# Patient Record
Sex: Male | Born: 1935 | Race: White | Hispanic: No | Smoking: Current every day smoker
Health system: Southern US, Community
[De-identification: ages and names within clinical notes are randomized; demographics above are authoritative.]

## PROBLEM LIST (undated history)

## (undated) ENCOUNTER — Emergency Department (HOSPITAL_COMMUNITY): Discharge status: Absent without leave | Payer: Medicare Other

## (undated) DIAGNOSIS — N186 End stage renal disease: Secondary | ICD-10-CM

## (undated) DIAGNOSIS — I10 Essential (primary) hypertension: Secondary | ICD-10-CM

## (undated) DIAGNOSIS — E119 Type 2 diabetes mellitus without complications: Secondary | ICD-10-CM

## (undated) DIAGNOSIS — E039 Hypothyroidism, unspecified: Secondary | ICD-10-CM

## (undated) DIAGNOSIS — M899 Disorder of bone, unspecified: Secondary | ICD-10-CM

## (undated) DIAGNOSIS — Z992 Dependence on renal dialysis: Secondary | ICD-10-CM

## (undated) DIAGNOSIS — N189 Chronic kidney disease, unspecified: Secondary | ICD-10-CM

## (undated) DIAGNOSIS — I739 Peripheral vascular disease, unspecified: Secondary | ICD-10-CM

## (undated) DIAGNOSIS — R809 Proteinuria, unspecified: Secondary | ICD-10-CM

## (undated) DIAGNOSIS — D649 Anemia, unspecified: Secondary | ICD-10-CM

## (undated) DIAGNOSIS — R519 Headache, unspecified: Secondary | ICD-10-CM

## (undated) DIAGNOSIS — I509 Heart failure, unspecified: Secondary | ICD-10-CM

## (undated) DIAGNOSIS — E785 Hyperlipidemia, unspecified: Secondary | ICD-10-CM

## (undated) DIAGNOSIS — H9190 Unspecified hearing loss, unspecified ear: Secondary | ICD-10-CM

## (undated) DIAGNOSIS — M1990 Unspecified osteoarthritis, unspecified site: Secondary | ICD-10-CM

## (undated) DIAGNOSIS — I4891 Unspecified atrial fibrillation: Secondary | ICD-10-CM

## (undated) DIAGNOSIS — E1122 Type 2 diabetes mellitus with diabetic chronic kidney disease: Secondary | ICD-10-CM

## (undated) DIAGNOSIS — G4730 Sleep apnea, unspecified: Secondary | ICD-10-CM

## (undated) DIAGNOSIS — Z973 Presence of spectacles and contact lenses: Secondary | ICD-10-CM

## (undated) HISTORY — PX: CATARACT EXTRACTION W/ INTRAOCULAR LENS  IMPLANT, BILATERAL: SHX1307

## (undated) HISTORY — PX: COLONOSCOPY: SHX174

## (undated) HISTORY — PX: HIP ARTHROSCOPY: SHX668

---

## 2014-05-09 ENCOUNTER — Encounter: Payer: Self-pay | Admitting: Internal Medicine

## 2018-01-06 ENCOUNTER — Other Ambulatory Visit (HOSPITAL_COMMUNITY): Payer: Self-pay | Admitting: Nephrology

## 2018-01-06 DIAGNOSIS — N183 Chronic kidney disease, stage 3 unspecified: Secondary | ICD-10-CM

## 2018-01-13 ENCOUNTER — Ambulatory Visit (HOSPITAL_COMMUNITY)
Admission: RE | Admit: 2018-01-13 | Discharge: 2018-01-13 | Disposition: A | Discharge status: Home / Self Care | Payer: Medicare Other | Source: Ambulatory Visit | Attending: Nephrology | Admitting: Nephrology

## 2018-01-13 DIAGNOSIS — N183 Chronic kidney disease, stage 3 unspecified: Secondary | ICD-10-CM

## 2018-09-18 ENCOUNTER — Ambulatory Visit (HOSPITAL_COMMUNITY): Payer: Medicare Other

## 2018-09-18 ENCOUNTER — Inpatient Hospital Stay (HOSPITAL_COMMUNITY): Admission: RE | Admit: 2018-09-18 | Payer: Medicare Other | Source: Ambulatory Visit

## 2018-09-20 ENCOUNTER — Encounter (HOSPITAL_COMMUNITY): Admission: RE | Admit: 2018-09-20 | Payer: Medicare Other | Source: Ambulatory Visit

## 2018-09-20 ENCOUNTER — Encounter (HOSPITAL_COMMUNITY): Payer: Medicare Other

## 2018-09-22 ENCOUNTER — Encounter (HOSPITAL_COMMUNITY): Payer: Medicare Other

## 2018-09-22 ENCOUNTER — Encounter (HOSPITAL_COMMUNITY): Admission: RE | Admit: 2018-09-22 | Payer: Medicare Other | Source: Ambulatory Visit

## 2018-09-27 ENCOUNTER — Encounter (HOSPITAL_COMMUNITY)
Admission: RE | Admit: 2018-09-27 | Discharge: 2018-09-27 | Disposition: A | Discharge status: Home / Self Care | Payer: Medicare Other | Source: Ambulatory Visit | Attending: Nephrology | Admitting: Nephrology

## 2018-09-27 ENCOUNTER — Encounter (HOSPITAL_COMMUNITY): Admission: RE | Admit: 2018-09-27 | Payer: Medicare Other | Source: Ambulatory Visit

## 2018-09-27 DIAGNOSIS — D631 Anemia in chronic kidney disease: Secondary | ICD-10-CM | POA: Diagnosis not present

## 2018-09-27 DIAGNOSIS — N185 Chronic kidney disease, stage 5: Secondary | ICD-10-CM | POA: Diagnosis present

## 2018-09-27 LAB — POCT HEMOGLOBIN-HEMACUE: Hemoglobin: 7.8 g/dL — ABNORMAL LOW (ref 13.0–17.0)

## 2018-09-27 MED ORDER — EPOETIN ALFA 10000 UNIT/ML IJ SOLN
INTRAMUSCULAR | Status: AC
  Filled 2018-09-27: qty 1

## 2018-09-27 MED ORDER — EPOETIN ALFA 10000 UNIT/ML IJ SOLN
10000.0000 [IU] | Freq: Once | INTRAMUSCULAR | Status: AC
  Administered 2018-09-27: 10000 [IU] via SUBCUTANEOUS

## 2018-09-27 NOTE — Progress Notes (Signed)
Results for LINKON, SIVERSON (MRN [id number]) as of 09/27/2018 09:08  Ref. Range 09/27/2018 08:52  Hemoglobin Latest Ref Range: 13.0 - 17.0 g/dL 7.8 (L)

## 2018-10-09 ENCOUNTER — Encounter (HOSPITAL_COMMUNITY)
Admission: RE | Admit: 2018-10-09 | Discharge: 2018-10-09 | Disposition: A | Discharge status: Home / Self Care | Payer: Medicare Other | Source: Ambulatory Visit | Attending: Nephrology | Admitting: Nephrology

## 2018-10-09 ENCOUNTER — Other Ambulatory Visit (HOSPITAL_COMMUNITY): Payer: Medicare Other

## 2018-10-09 ENCOUNTER — Ambulatory Visit (HOSPITAL_COMMUNITY): Payer: Medicare Other

## 2018-10-09 DIAGNOSIS — N185 Chronic kidney disease, stage 5: Secondary | ICD-10-CM | POA: Diagnosis not present

## 2018-10-09 LAB — POCT HEMOGLOBIN-HEMACUE: Hemoglobin: 7.8 g/dL — ABNORMAL LOW (ref 13.0–17.0)

## 2018-10-09 MED ORDER — EPOETIN ALFA 10000 UNIT/ML IJ SOLN
10000.0000 [IU] | INTRAMUSCULAR | Status: DC
  Administered 2018-10-09: 10000 [IU] via SUBCUTANEOUS

## 2018-10-09 MED ORDER — EPOETIN ALFA 10000 UNIT/ML IJ SOLN
INTRAMUSCULAR | Status: AC
  Filled 2018-10-09: qty 1

## 2018-10-09 NOTE — Progress Notes (Signed)
Results for Terry Graves, EMPEY (MRN [id number]) as of 10/09/2018 07:58  Ref. Range 10/09/2018 07:46  Hemoglobin Latest Ref Range: 13.0 - 17.0 g/dL 7.8 (L)

## 2018-10-23 ENCOUNTER — Encounter (HOSPITAL_COMMUNITY): Payer: Medicare Other

## 2018-10-25 ENCOUNTER — Encounter (HOSPITAL_COMMUNITY)
Admission: RE | Admit: 2018-10-25 | Discharge: 2018-10-25 | Disposition: A | Discharge status: Home / Self Care | Payer: Medicare Other | Source: Ambulatory Visit | Attending: Nephrology | Admitting: Nephrology

## 2018-10-25 DIAGNOSIS — N185 Chronic kidney disease, stage 5: Secondary | ICD-10-CM | POA: Diagnosis not present

## 2018-10-25 DIAGNOSIS — D631 Anemia in chronic kidney disease: Secondary | ICD-10-CM | POA: Insufficient documentation

## 2018-10-25 LAB — POCT HEMOGLOBIN-HEMACUE: Hemoglobin: 8.2 g/dL — ABNORMAL LOW (ref 13.0–17.0)

## 2018-10-25 MED ORDER — EPOETIN ALFA 10000 UNIT/ML IJ SOLN
INTRAMUSCULAR | Status: AC
  Filled 2018-10-25: qty 1

## 2018-10-25 MED ORDER — EPOETIN ALFA 10000 UNIT/ML IJ SOLN
10000.0000 [IU] | Freq: Once | INTRAMUSCULAR | Status: AC
  Administered 2018-10-25: 10000 [IU] via SUBCUTANEOUS

## 2018-10-25 NOTE — Progress Notes (Signed)
Results for KIPLING, GRASER (MRN [id number]) as of 10/25/2018 08:53  Ref. Range 10/25/2018 07:40  Hemoglobin Latest Ref Range: 13.0 - 17.0 g/dL 8.2 (L)

## 2018-11-08 ENCOUNTER — Encounter (HOSPITAL_COMMUNITY)
Admission: RE | Admit: 2018-11-08 | Discharge: 2018-11-08 | Disposition: A | Discharge status: Home / Self Care | Payer: Medicare Other | Source: Ambulatory Visit | Attending: Nephrology | Admitting: Nephrology

## 2018-11-08 ENCOUNTER — Encounter (HOSPITAL_COMMUNITY): Payer: Self-pay

## 2018-11-08 DIAGNOSIS — N185 Chronic kidney disease, stage 5: Secondary | ICD-10-CM | POA: Diagnosis not present

## 2018-11-08 HISTORY — DX: Disorder of bone, unspecified: M89.9

## 2018-11-08 HISTORY — DX: Type 2 diabetes mellitus without complications: E11.9

## 2018-11-08 HISTORY — DX: Disorder of bone, unspecified: N18.9

## 2018-11-08 HISTORY — DX: Proteinuria, unspecified: R80.9

## 2018-11-08 HISTORY — DX: Essential (primary) hypertension: I10

## 2018-11-08 HISTORY — DX: Disorder of mineral metabolism, unspecified: E83.9

## 2018-11-08 HISTORY — DX: Anemia, unspecified: D64.9

## 2018-11-08 LAB — POCT HEMOGLOBIN-HEMACUE: Hemoglobin: 8 g/dL — ABNORMAL LOW (ref 13.0–17.0)

## 2018-11-08 MED ORDER — EPOETIN ALFA 10000 UNIT/ML IJ SOLN
10000.0000 [IU] | INTRAMUSCULAR | Status: DC
  Administered 2018-11-08: 10000 [IU] via SUBCUTANEOUS
  Filled 2018-11-08: qty 1

## 2018-11-22 ENCOUNTER — Encounter (HOSPITAL_COMMUNITY)
Admission: RE | Admit: 2018-11-22 | Discharge: 2018-11-22 | Disposition: A | Discharge status: Home / Self Care | Payer: Medicare Other | Source: Ambulatory Visit | Attending: Nephrology | Admitting: Nephrology

## 2018-11-22 ENCOUNTER — Encounter (HOSPITAL_COMMUNITY): Payer: Self-pay

## 2018-11-22 DIAGNOSIS — N185 Chronic kidney disease, stage 5: Secondary | ICD-10-CM | POA: Diagnosis present

## 2018-11-22 DIAGNOSIS — D631 Anemia in chronic kidney disease: Secondary | ICD-10-CM | POA: Insufficient documentation

## 2018-11-22 LAB — POCT HEMOGLOBIN-HEMACUE: Hemoglobin: 8.1 g/dL — ABNORMAL LOW (ref 13.0–17.0)

## 2018-11-22 MED ORDER — EPOETIN ALFA 10000 UNIT/ML IJ SOLN
10000.0000 [IU] | Freq: Once | INTRAMUSCULAR | Status: AC
  Administered 2018-11-22: 10000 [IU] via SUBCUTANEOUS
  Filled 2018-11-22: qty 1

## 2018-12-06 ENCOUNTER — Encounter (HOSPITAL_COMMUNITY): Admission: RE | Admit: 2018-12-06 | Payer: Medicare Other | Source: Ambulatory Visit

## 2018-12-06 ENCOUNTER — Encounter (HOSPITAL_COMMUNITY)
Admission: RE | Admit: 2018-12-06 | Discharge: 2018-12-06 | Disposition: A | Discharge status: Home / Self Care | Payer: Medicare Other | Source: Ambulatory Visit | Attending: Nephrology | Admitting: Nephrology

## 2018-12-06 ENCOUNTER — Encounter (HOSPITAL_COMMUNITY): Payer: Self-pay

## 2018-12-06 ENCOUNTER — Encounter (HOSPITAL_COMMUNITY): Payer: Medicare Other

## 2018-12-06 DIAGNOSIS — N185 Chronic kidney disease, stage 5: Secondary | ICD-10-CM | POA: Diagnosis not present

## 2018-12-06 LAB — POCT HEMOGLOBIN-HEMACUE: Hemoglobin: 9 g/dL — ABNORMAL LOW (ref 13.0–17.0)

## 2018-12-06 MED ORDER — EPOETIN ALFA 10000 UNIT/ML IJ SOLN
10000.0000 [IU] | Freq: Once | INTRAMUSCULAR | Status: AC
  Administered 2018-12-06: 10000 [IU] via SUBCUTANEOUS
  Filled 2018-12-06: qty 1

## 2018-12-20 ENCOUNTER — Encounter (HOSPITAL_COMMUNITY)
Admission: RE | Admit: 2018-12-20 | Discharge: 2018-12-20 | Disposition: A | Discharge status: Home / Self Care | Payer: Medicare Other | Source: Ambulatory Visit | Attending: Nephrology | Admitting: Nephrology

## 2018-12-20 ENCOUNTER — Encounter (HOSPITAL_COMMUNITY): Payer: Self-pay

## 2018-12-20 ENCOUNTER — Other Ambulatory Visit: Payer: Self-pay

## 2018-12-20 DIAGNOSIS — N185 Chronic kidney disease, stage 5: Secondary | ICD-10-CM | POA: Insufficient documentation

## 2018-12-20 DIAGNOSIS — D631 Anemia in chronic kidney disease: Secondary | ICD-10-CM | POA: Diagnosis not present

## 2018-12-20 LAB — POCT HEMOGLOBIN-HEMACUE: Hemoglobin: 8.3 g/dL — ABNORMAL LOW (ref 13.0–17.0)

## 2018-12-20 MED ORDER — EPOETIN ALFA 10000 UNIT/ML IJ SOLN
10000.0000 [IU] | Freq: Once | INTRAMUSCULAR | Status: AC
  Administered 2018-12-20: 10000 [IU] via SUBCUTANEOUS

## 2018-12-20 MED ORDER — EPOETIN ALFA 10000 UNIT/ML IJ SOLN
INTRAMUSCULAR | Status: AC
  Filled 2018-12-20: qty 1

## 2019-01-03 ENCOUNTER — Ambulatory Visit (HOSPITAL_COMMUNITY): Payer: Medicare Other

## 2019-01-03 ENCOUNTER — Inpatient Hospital Stay (HOSPITAL_COMMUNITY): Admission: RE | Admit: 2019-01-03 | Payer: Medicare Other | Source: Ambulatory Visit

## 2019-01-23 ENCOUNTER — Encounter (HOSPITAL_COMMUNITY)
Admission: RE | Admit: 2019-01-23 | Discharge: 2019-01-23 | Disposition: A | Discharge status: Home / Self Care | Payer: Medicare Other | Source: Ambulatory Visit | Attending: Nephrology | Admitting: Nephrology

## 2019-01-23 ENCOUNTER — Other Ambulatory Visit: Payer: Self-pay

## 2019-01-23 DIAGNOSIS — N185 Chronic kidney disease, stage 5: Secondary | ICD-10-CM | POA: Insufficient documentation

## 2019-01-23 DIAGNOSIS — D631 Anemia in chronic kidney disease: Secondary | ICD-10-CM | POA: Insufficient documentation

## 2019-01-23 LAB — FERRITIN: Ferritin: 371 ng/mL — ABNORMAL HIGH (ref 24–336)

## 2019-01-23 LAB — RENAL FUNCTION PANEL
Albumin: 3.4 g/dL — ABNORMAL LOW (ref 3.5–5.0)
Anion gap: 8 (ref 5–15)
BUN: 76 mg/dL — ABNORMAL HIGH (ref 8–23)
CO2: 19 mmol/L — ABNORMAL LOW (ref 22–32)
Calcium: 8.8 mg/dL — ABNORMAL LOW (ref 8.9–10.3)
Chloride: 113 mmol/L — ABNORMAL HIGH (ref 98–111)
Creatinine, Ser: 3.86 mg/dL — ABNORMAL HIGH (ref 0.61–1.24)
GFR calc Af Amer: 16 mL/min — ABNORMAL LOW (ref >60)
GFR calc non Af Amer: 14 mL/min — ABNORMAL LOW (ref >60)
Glucose, Bld: 102 mg/dL — ABNORMAL HIGH (ref 70–99)
Phosphorus: 4 mg/dL (ref 2.5–4.6)
Potassium: 4.5 mmol/L (ref 3.5–5.1)
Sodium: 140 mmol/L (ref 135–145)

## 2019-01-23 LAB — PROTEIN / CREATININE RATIO, URINE
Creatinine, Urine: 36.91 mg/dL
Protein Creatinine Ratio: 1.9 mg/mg{Cre} — ABNORMAL HIGH (ref 0.00–0.15)
Total Protein, Urine: 70 mg/dL

## 2019-01-23 LAB — IRON AND TIBC
Iron: 143 ug/dL (ref 45–182)
Saturation Ratios: 41 % — ABNORMAL HIGH (ref 17.9–39.5)
TIBC: 348 ug/dL (ref 250–450)
UIBC: 205 ug/dL

## 2019-01-23 LAB — HEMOGLOBIN AND HEMATOCRIT, BLOOD
HCT: 25 % — ABNORMAL LOW (ref 39.0–52.0)
Hemoglobin: 7.9 g/dL — ABNORMAL LOW (ref 13.0–17.0)

## 2019-01-23 MED ORDER — EPOETIN ALFA 10000 UNIT/ML IJ SOLN
10000.0000 [IU] | INTRAMUSCULAR | Status: DC
  Administered 2019-01-23: 10000 [IU] via SUBCUTANEOUS

## 2019-01-23 MED ORDER — EPOETIN ALFA 10000 UNIT/ML IJ SOLN
INTRAMUSCULAR | Status: AC
  Filled 2019-01-23: qty 1

## 2019-01-23 NOTE — Progress Notes (Signed)
  Patient arrived today for lab work HGB 7.9 HCT 25.0 Procrit 10,000 units given as ordered. These are the lab that are available, some are still pending.   Results for Terry Graves, Terry Graves (MRN [id number]) as of 01/23/2019 15:14  Ref. Range 01/23/2019 12:17  Sodium Latest Ref Range: 135 - 145 mmol/L 140  Potassium Latest Ref Range: 3.5 - 5.1 mmol/L 4.5  Chloride Latest Ref Range: 98 - 111 mmol/L 113 (H)  CO2 Latest Ref Range: 22 - 32 mmol/L 19 (L)  Glucose Latest Ref Range: 70 - 99 mg/dL 102 (H)  BUN Latest Ref Range: 8 - 23 mg/dL 76 (H)  Creatinine Latest Ref Range: 0.61 - 1.24 mg/dL 3.86 (H)  Calcium Latest Ref Range: 8.9 - 10.3 mg/dL 8.8 (L)  Anion gap Latest Ref Range: 5 - 15  8  Phosphorus Latest Ref Range: 2.5 - 4.6 mg/dL 4.0  Albumin Latest Ref Range: 3.5 - 5.0 g/dL 3.4 (L)  GFR, Est Non African American Latest Ref Range: >60 mL/min 14 (L)  GFR, Est African American Latest Ref Range: >60 mL/min 16 (L)  Iron Latest Ref Range: 45 - 182 ug/dL 143  UIBC Latest Units: ug/dL 205  TIBC Latest Ref Range: 250 - 450 ug/dL 348  Saturation Ratios Latest Ref Range: 17.9 - 39.5 % 41 (H)  Ferritin Latest Ref Range: 24 - 336 ng/mL 371 (H)  Hemoglobin Latest Ref Range: 13.0 - 17.0 g/dL 7.9 (L)  HCT Latest Ref Range: 39.0 - 52.0 % 25.0 (L)  Total Protein, Urine Latest Units: mg/dL 70  Protein Creatinine Ratio Latest Ref Range: 0.00 - 0.15 mg/mgCre 1.90 (H)  Creatinine, Urine Latest Units: mg/dL 36.91

## 2019-01-24 LAB — PTH, INTACT AND CALCIUM
Calcium, Total (PTH): 8.9 mg/dL (ref 8.6–10.2)
PTH: 47 pg/mL (ref 15–65)

## 2019-01-24 LAB — VITAMIN D 25 HYDROXY (VIT D DEFICIENCY, FRACTURES): Vit D, 25-Hydroxy: 64.1 ng/mL (ref 30.0–100.0)

## 2019-02-06 ENCOUNTER — Encounter (HOSPITAL_COMMUNITY): Payer: Medicare Other

## 2019-02-06 ENCOUNTER — Encounter (HOSPITAL_COMMUNITY): Admission: RE | Admit: 2019-02-06 | Payer: Medicare Other | Source: Ambulatory Visit

## 2019-02-07 ENCOUNTER — Encounter (HOSPITAL_COMMUNITY): Payer: Self-pay

## 2019-02-07 ENCOUNTER — Encounter (HOSPITAL_COMMUNITY)
Admission: RE | Admit: 2019-02-07 | Discharge: 2019-02-07 | Disposition: A | Discharge status: Home / Self Care | Payer: Medicare Other | Source: Ambulatory Visit | Attending: Nephrology | Admitting: Nephrology

## 2019-02-07 ENCOUNTER — Other Ambulatory Visit: Payer: Self-pay

## 2019-02-07 DIAGNOSIS — N185 Chronic kidney disease, stage 5: Secondary | ICD-10-CM | POA: Diagnosis not present

## 2019-02-07 LAB — POCT HEMOGLOBIN-HEMACUE
Hemoglobin: 15.2 g/dL (ref 13.0–17.0)
Hemoglobin: 7.6 g/dL — ABNORMAL LOW (ref 13.0–17.0)

## 2019-02-07 MED ORDER — EPOETIN ALFA 10000 UNIT/ML IJ SOLN
10000.0000 [IU] | INTRAMUSCULAR | Status: DC
  Administered 2019-02-07: 10000 [IU] via SUBCUTANEOUS

## 2019-02-07 MED ORDER — EPOETIN ALFA 10000 UNIT/ML IJ SOLN
INTRAMUSCULAR | Status: AC
  Filled 2019-02-07: qty 1

## 2019-02-07 NOTE — Progress Notes (Signed)
Results for MANUAL, NAVARRA (MRN [id number]) as of 02/07/2019 11:4)  )Procrit 10,000 units given as indicated.  Next appointment 02/21/2019 @ 0800  Ref. Range 02/07/2019 11:13  Hemoglobin Latest Ref Range: 13.0 - 17.0 g/dL 7.6 (L)

## 2019-02-21 ENCOUNTER — Encounter (HOSPITAL_COMMUNITY)
Admission: RE | Admit: 2019-02-21 | Discharge: 2019-02-21 | Disposition: A | Discharge status: Home / Self Care | Payer: Medicare Other | Source: Ambulatory Visit | Attending: Nephrology | Admitting: Nephrology

## 2019-02-21 ENCOUNTER — Other Ambulatory Visit: Payer: Self-pay

## 2019-02-21 DIAGNOSIS — D631 Anemia in chronic kidney disease: Secondary | ICD-10-CM | POA: Insufficient documentation

## 2019-02-21 DIAGNOSIS — N185 Chronic kidney disease, stage 5: Secondary | ICD-10-CM | POA: Insufficient documentation

## 2019-02-21 LAB — POCT HEMOGLOBIN-HEMACUE: Hemoglobin: 7.8 g/dL — ABNORMAL LOW (ref 13.0–17.0)

## 2019-02-21 MED ORDER — DARBEPOETIN ALFA 60 MCG/0.3ML IJ SOSY
PREFILLED_SYRINGE | INTRAMUSCULAR | Status: AC
  Filled 2019-02-21: qty 0.3

## 2019-02-21 MED ORDER — DARBEPOETIN ALFA 60 MCG/0.3ML IJ SOSY
60.0000 ug | PREFILLED_SYRINGE | INTRAMUSCULAR | Status: DC
  Administered 2019-02-21: 60 ug via SUBCUTANEOUS

## 2019-03-07 ENCOUNTER — Other Ambulatory Visit: Payer: Self-pay

## 2019-03-07 ENCOUNTER — Encounter (HOSPITAL_COMMUNITY)
Admission: RE | Admit: 2019-03-07 | Discharge: 2019-03-07 | Disposition: A | Discharge status: Home / Self Care | Payer: Medicare Other | Source: Ambulatory Visit | Attending: Nephrology | Admitting: Nephrology

## 2019-03-07 DIAGNOSIS — N185 Chronic kidney disease, stage 5: Secondary | ICD-10-CM | POA: Diagnosis not present

## 2019-03-07 LAB — POCT HEMOGLOBIN-HEMACUE: Hemoglobin: 7.4 g/dL — ABNORMAL LOW (ref 13.0–17.0)

## 2019-03-07 MED ORDER — DARBEPOETIN ALFA 60 MCG/0.3ML IJ SOSY
60.0000 ug | PREFILLED_SYRINGE | INTRAMUSCULAR | Status: DC
  Administered 2019-03-07: 60 ug via SUBCUTANEOUS
  Filled 2019-03-07: qty 0.3

## 2019-03-07 MED ORDER — SIMETHICONE 40 MG/0.6ML PO SUSP
ORAL | Status: AC
  Filled 2019-03-07: qty 0.6

## 2019-03-21 ENCOUNTER — Other Ambulatory Visit: Payer: Self-pay

## 2019-03-21 ENCOUNTER — Encounter (HOSPITAL_COMMUNITY)
Admission: RE | Admit: 2019-03-21 | Discharge: 2019-03-21 | Disposition: A | Discharge status: Home / Self Care | Payer: Medicare Other | Source: Ambulatory Visit | Attending: Nephrology | Admitting: Nephrology

## 2019-03-21 ENCOUNTER — Encounter (HOSPITAL_COMMUNITY): Payer: Self-pay

## 2019-03-21 DIAGNOSIS — N185 Chronic kidney disease, stage 5: Secondary | ICD-10-CM | POA: Insufficient documentation

## 2019-03-21 DIAGNOSIS — D631 Anemia in chronic kidney disease: Secondary | ICD-10-CM | POA: Insufficient documentation

## 2019-03-21 LAB — POCT HEMOGLOBIN-HEMACUE: Hemoglobin: 7.8 g/dL — ABNORMAL LOW (ref 13.0–17.0)

## 2019-03-21 MED ORDER — DARBEPOETIN ALFA 60 MCG/0.3ML IJ SOSY
60.0000 ug | PREFILLED_SYRINGE | Freq: Once | INTRAMUSCULAR | Status: AC
  Administered 2019-03-21: 60 ug via SUBCUTANEOUS
  Filled 2019-03-21: qty 0.3

## 2019-03-21 NOTE — Progress Notes (Signed)
Results for Terry Graves, Terry Graves (MRN [id number]) as of 03/21/2019 09:25  Ref. Range 03/21/2019 08:18  Hemoglobin Latest Ref Range: 13.0 - 17.0 g/dL 7.8 (L)

## 2019-04-04 ENCOUNTER — Encounter (HOSPITAL_COMMUNITY)
Admission: RE | Admit: 2019-04-04 | Discharge: 2019-04-04 | Disposition: A | Discharge status: Home / Self Care | Payer: Medicare Other | Source: Ambulatory Visit | Attending: Nephrology | Admitting: Nephrology

## 2019-04-04 ENCOUNTER — Encounter (HOSPITAL_COMMUNITY): Payer: Medicare Other

## 2019-04-11 ENCOUNTER — Ambulatory Visit (HOSPITAL_COMMUNITY): Admission: RE | Admit: 2019-04-11 | Payer: Medicare Other | Source: Ambulatory Visit

## 2019-04-11 ENCOUNTER — Ambulatory Visit (HOSPITAL_COMMUNITY): Payer: Medicare Other

## 2019-04-24 ENCOUNTER — Emergency Department (HOSPITAL_COMMUNITY): Payer: Medicare Other

## 2019-04-24 ENCOUNTER — Inpatient Hospital Stay (HOSPITAL_COMMUNITY)
Admission: EM | Admit: 2019-04-24 | Discharge: 2019-04-27 | DRG: 378 | Disposition: A | Discharge status: Home Health | Payer: Medicare Other | Source: Ambulatory Visit | Attending: Internal Medicine | Admitting: Internal Medicine

## 2019-04-24 ENCOUNTER — Encounter (HOSPITAL_COMMUNITY)
Admission: RE | Admit: 2019-04-24 | Discharge: 2019-04-24 | Disposition: A | Discharge status: Home / Self Care | Payer: Medicare Other | Source: Ambulatory Visit | Attending: Nephrology | Admitting: Nephrology

## 2019-04-24 ENCOUNTER — Encounter (HOSPITAL_COMMUNITY): Payer: Self-pay

## 2019-04-24 ENCOUNTER — Other Ambulatory Visit: Payer: Self-pay

## 2019-04-24 DIAGNOSIS — K254 Chronic or unspecified gastric ulcer with hemorrhage: Principal | ICD-10-CM | POA: Diagnosis present

## 2019-04-24 DIAGNOSIS — R195 Other fecal abnormalities: Secondary | ICD-10-CM

## 2019-04-24 DIAGNOSIS — E785 Hyperlipidemia, unspecified: Secondary | ICD-10-CM | POA: Diagnosis present

## 2019-04-24 DIAGNOSIS — I471 Supraventricular tachycardia: Secondary | ICD-10-CM | POA: Diagnosis present

## 2019-04-24 DIAGNOSIS — M25552 Pain in left hip: Secondary | ICD-10-CM | POA: Diagnosis present

## 2019-04-24 DIAGNOSIS — K2970 Gastritis, unspecified, without bleeding: Secondary | ICD-10-CM | POA: Diagnosis not present

## 2019-04-24 DIAGNOSIS — E782 Mixed hyperlipidemia: Secondary | ICD-10-CM | POA: Diagnosis not present

## 2019-04-24 DIAGNOSIS — R296 Repeated falls: Secondary | ICD-10-CM | POA: Diagnosis present

## 2019-04-24 DIAGNOSIS — Z801 Family history of malignant neoplasm of trachea, bronchus and lung: Secondary | ICD-10-CM

## 2019-04-24 DIAGNOSIS — I120 Hypertensive chronic kidney disease with stage 5 chronic kidney disease or end stage renal disease: Secondary | ICD-10-CM | POA: Diagnosis present

## 2019-04-24 DIAGNOSIS — Z7982 Long term (current) use of aspirin: Secondary | ICD-10-CM

## 2019-04-24 DIAGNOSIS — I340 Nonrheumatic mitral (valve) insufficiency: Secondary | ICD-10-CM | POA: Diagnosis not present

## 2019-04-24 DIAGNOSIS — D62 Acute posthemorrhagic anemia: Secondary | ICD-10-CM | POA: Diagnosis present

## 2019-04-24 DIAGNOSIS — I361 Nonrheumatic tricuspid (valve) insufficiency: Secondary | ICD-10-CM | POA: Diagnosis not present

## 2019-04-24 DIAGNOSIS — D649 Anemia, unspecified: Secondary | ICD-10-CM

## 2019-04-24 DIAGNOSIS — L89152 Pressure ulcer of sacral region, stage 2: Secondary | ICD-10-CM | POA: Diagnosis present

## 2019-04-24 DIAGNOSIS — F17290 Nicotine dependence, other tobacco product, uncomplicated: Secondary | ICD-10-CM | POA: Diagnosis present

## 2019-04-24 DIAGNOSIS — G4730 Sleep apnea, unspecified: Secondary | ICD-10-CM | POA: Diagnosis present

## 2019-04-24 DIAGNOSIS — E1122 Type 2 diabetes mellitus with diabetic chronic kidney disease: Secondary | ICD-10-CM | POA: Diagnosis present

## 2019-04-24 DIAGNOSIS — R2689 Other abnormalities of gait and mobility: Secondary | ICD-10-CM | POA: Diagnosis present

## 2019-04-24 DIAGNOSIS — K259 Gastric ulcer, unspecified as acute or chronic, without hemorrhage or perforation: Secondary | ICD-10-CM | POA: Diagnosis not present

## 2019-04-24 DIAGNOSIS — Z1159 Encounter for screening for other viral diseases: Secondary | ICD-10-CM | POA: Diagnosis not present

## 2019-04-24 DIAGNOSIS — K228 Other specified diseases of esophagus: Secondary | ICD-10-CM | POA: Diagnosis not present

## 2019-04-24 DIAGNOSIS — N185 Chronic kidney disease, stage 5: Secondary | ICD-10-CM | POA: Insufficient documentation

## 2019-04-24 DIAGNOSIS — K2971 Gastritis, unspecified, with bleeding: Secondary | ICD-10-CM | POA: Diagnosis present

## 2019-04-24 DIAGNOSIS — N289 Disorder of kidney and ureter, unspecified: Secondary | ICD-10-CM

## 2019-04-24 DIAGNOSIS — D631 Anemia in chronic kidney disease: Secondary | ICD-10-CM | POA: Diagnosis present

## 2019-04-24 DIAGNOSIS — I10 Essential (primary) hypertension: Secondary | ICD-10-CM | POA: Diagnosis present

## 2019-04-24 DIAGNOSIS — K921 Melena: Secondary | ICD-10-CM | POA: Diagnosis not present

## 2019-04-24 HISTORY — DX: Hyperlipidemia, unspecified: E78.5

## 2019-04-24 HISTORY — DX: Type 2 diabetes mellitus with diabetic chronic kidney disease: E11.22

## 2019-04-24 LAB — IRON AND TIBC
Iron: 61 ug/dL (ref 45–182)
Saturation Ratios: 25 % (ref 17.9–39.5)
TIBC: 245 ug/dL — ABNORMAL LOW (ref 250–450)
UIBC: 184 ug/dL

## 2019-04-24 LAB — HEMOGLOBIN AND HEMATOCRIT, BLOOD
HCT: 15 % — ABNORMAL LOW (ref 39.0–52.0)
Hemoglobin: 4.9 g/dL — CL (ref 13.0–17.0)

## 2019-04-24 LAB — BASIC METABOLIC PANEL
Anion gap: 7 (ref 5–15)
BUN: 97 mg/dL — ABNORMAL HIGH (ref 8–23)
CO2: 23 mmol/L (ref 22–32)
Calcium: 8 mg/dL — ABNORMAL LOW (ref 8.9–10.3)
Chloride: 107 mmol/L (ref 98–111)
Creatinine, Ser: 3.84 mg/dL — ABNORMAL HIGH (ref 0.61–1.24)
GFR calc Af Amer: 16 mL/min — ABNORMAL LOW (ref >60)
GFR calc non Af Amer: 14 mL/min — ABNORMAL LOW (ref >60)
Glucose, Bld: 146 mg/dL — ABNORMAL HIGH (ref 70–99)
Potassium: 4.4 mmol/L (ref 3.5–5.1)
Sodium: 137 mmol/L (ref 135–145)

## 2019-04-24 LAB — CBC WITH DIFFERENTIAL/PLATELET
Abs Immature Granulocytes: 0.03 10*3/uL (ref 0.00–0.07)
Basophils Absolute: 0 10*3/uL (ref 0.0–0.1)
Basophils Relative: 0 %
Eosinophils Absolute: 0 10*3/uL (ref 0.0–0.5)
Eosinophils Relative: 0 %
HCT: 15 % — ABNORMAL LOW (ref 39.0–52.0)
Hemoglobin: 4.8 g/dL — CL (ref 13.0–17.0)
Immature Granulocytes: 0 %
Lymphocytes Relative: 19 %
Lymphs Abs: 1.8 10*3/uL (ref 0.7–4.0)
MCH: 32 pg (ref 26.0–34.0)
MCHC: 32 g/dL (ref 30.0–36.0)
MCV: 100 fL (ref 80.0–100.0)
Monocytes Absolute: 0.8 10*3/uL (ref 0.1–1.0)
Monocytes Relative: 9 %
Neutro Abs: 6.4 10*3/uL (ref 1.7–7.7)
Neutrophils Relative %: 72 %
Platelets: 155 10*3/uL (ref 150–400)
RBC: 1.5 MIL/uL — ABNORMAL LOW (ref 4.22–5.81)
RDW: 13.8 % (ref 11.5–15.5)
WBC: 9.1 10*3/uL (ref 4.0–10.5)
nRBC: 0 % (ref 0.0–0.2)

## 2019-04-24 LAB — RETICULOCYTES
Immature Retic Fract: 15.5 % (ref 2.3–15.9)
RBC.: 1.5 MIL/uL — ABNORMAL LOW (ref 4.22–5.81)
Retic Count, Absolute: 72.6 10*3/uL (ref 19.0–186.0)
Retic Ct Pct: 4.8 % — ABNORMAL HIGH (ref 0.4–3.1)

## 2019-04-24 LAB — SARS CORONAVIRUS 2 BY RT PCR (HOSPITAL ORDER, PERFORMED IN ~~LOC~~ HOSPITAL LAB): SARS Coronavirus 2: NEGATIVE

## 2019-04-24 LAB — POCT HEMOGLOBIN-HEMACUE: Hemoglobin: 5 g/dL — CL (ref 13.0–17.0)

## 2019-04-24 LAB — VITAMIN B12: Vitamin B-12: 244 pg/mL (ref 180–914)

## 2019-04-24 LAB — POC OCCULT BLOOD, ED: Fecal Occult Bld: POSITIVE — AB

## 2019-04-24 LAB — PREPARE RBC (CROSSMATCH)

## 2019-04-24 LAB — FERRITIN: Ferritin: 158 ng/mL (ref 24–336)

## 2019-04-24 LAB — ABO/RH: ABO/RH(D): A POS

## 2019-04-24 MED ORDER — ROPINIROLE HCL 1 MG PO TABS
5.0000 mg | ORAL_TABLET | Freq: Every day | ORAL | Status: DC
  Administered 2019-04-25 – 2019-04-26 (×3): 5 mg via ORAL
  Filled 2019-04-24 (×8): qty 5

## 2019-04-24 MED ORDER — ACETAMINOPHEN 325 MG PO TABS: 650.0000 mg | ORAL_TABLET | Freq: Four times a day (QID) | ORAL | Status: DC | PRN

## 2019-04-24 MED ORDER — IOHEXOL 300 MG/ML  SOLN: 30.0000 mL | Freq: Once | INTRAMUSCULAR | Status: DC | PRN

## 2019-04-24 MED ORDER — ONDANSETRON HCL 4 MG/2ML IJ SOLN: 4.0000 mg | Freq: Four times a day (QID) | INTRAMUSCULAR | Status: DC | PRN

## 2019-04-24 MED ORDER — SODIUM BICARBONATE 650 MG PO TABS
650.0000 mg | ORAL_TABLET | Freq: Two times a day (BID) | ORAL | Status: DC
  Administered 2019-04-25 – 2019-04-27 (×5): 650 mg via ORAL
  Filled 2019-04-24 (×5): qty 1

## 2019-04-24 MED ORDER — TIOTROPIUM BROMIDE MONOHYDRATE 18 MCG IN CAPS
18.0000 ug | ORAL_CAPSULE | Freq: Every day | RESPIRATORY_TRACT | Status: DC
  Filled 2019-04-24: qty 5

## 2019-04-24 MED ORDER — PANTOPRAZOLE SODIUM 40 MG IV SOLR
40.0000 mg | Freq: Two times a day (BID) | INTRAVENOUS | Status: DC
  Administered 2019-04-24 – 2019-04-26 (×4): 40 mg via INTRAVENOUS
  Filled 2019-04-24 (×5): qty 40

## 2019-04-24 MED ORDER — SODIUM CHLORIDE 0.9 % IV SOLN
10.0000 mL/h | Freq: Once | INTRAVENOUS | Status: AC
  Administered 2019-04-24: 10 mL/h via INTRAVENOUS

## 2019-04-24 MED ORDER — SODIUM CHLORIDE 0.9% IV SOLUTION
Freq: Once | INTRAVENOUS | Status: AC
  Administered 2019-04-25: via INTRAVENOUS

## 2019-04-24 MED ORDER — OXYCODONE-ACETAMINOPHEN 5-325 MG PO TABS
1.0000 | ORAL_TABLET | ORAL | Status: DC | PRN
  Administered 2019-04-25 – 2019-04-27 (×4): 1 via ORAL
  Filled 2019-04-24 (×4): qty 1

## 2019-04-24 MED ORDER — ONDANSETRON HCL 4 MG PO TABS: 4.0000 mg | ORAL_TABLET | Freq: Four times a day (QID) | ORAL | Status: DC | PRN

## 2019-04-24 MED ORDER — ACETAMINOPHEN 650 MG RE SUPP: 650.0000 mg | Freq: Four times a day (QID) | RECTAL | Status: DC | PRN

## 2019-04-24 NOTE — ED Notes (Signed)
ED TO INPATIENT HANDOFF REPORT  ED Nurse Name and Phone #: Diyan Dave   S Name/Age/Gender Terry Graves. 84 y.o. male Room/Bed: APA02/APA02  Code Status   Code Status: Full Code  Home/SNF/Other Home Patient oriented to: self, place, time and situation Is this baseline? Yes   Triage Complete: Triage complete  Chief Complaint .  Triage Note Pt sent from day surgery.  Reports hemoglobin 4.9.  Pt comes to day surgery clinic for hemoglobin checks and arinesp injections.  Pt alert and oriented.  Reports had a fall recently and hurt left hip, chest, and tail bone but says is getting better.  Pt pale, reports feeling weak.    Allergies Allergies  Allergen Reactions  . Penicillins Rash    Did it involve swelling of the face/tongue/throat, SOB, or low BP? No Did it involve sudden or severe rash/hives, skin peeling, or any reaction on the inside of your mouth or nose? Yes Did you need to seek medical attention at a hospital or doctor's office? Unknown When did it last happen?60 years If all above answers are "NO", may proceed with cephalosporin use.    Level of Care/Admitting Diagnosis ED Disposition    ED Disposition Condition [facility] Area: [facility] [[id number]]  Level of Care: Stepdown [14]  Covid Evaluation: Asymptomatic Screening Protocol (No Symptoms)  Diagnosis: Gastrointestinal hemorrhage with melena [[id number]]  Admitting Physician: Reubin Milan [[id number]]  Attending Physician: Reubin Milan [[id number]]  Estimated length of stay: past midnight tomorrow  Certification:: I certify this patient will need inpatient services for at least 2 midnights  PT Class (Do Not Modify): Inpatient [101]  PT Acc Code (Do Not Modify): Private [1]       B Medical/Surgery History Past Medical History:  Diagnosis Date  . Anemia   . Chronic kidney disease    Stage 5  . Chronic kidney disease-mineral and bone disorder   . Hyperlipidemia   .  Hypertension   . Proteinuria   . Type 2 diabetes mellitus with stage 5 chronic kidney disease ([place])    History reviewed. No pertinent surgical history.   A IV Location/Drains/Wounds Patient Lines/Drains/Airways Status   Active Line/Drains/Airways    Name:   Placement date:   Placement time:   Site:   Days:   Peripheral IV 04/24/19 Left Hand   04/24/19    1400    Hand   less than 1          Intake/Output Last 24 hours No intake or output data in the 24 hours ending 04/24/19 2246  Labs/Imaging Results for orders placed or performed during the hospital encounter of 04/24/19 (from the past 48 hour(s))  SARS Coronavirus 2 (CEPHEID - Performed in [place] hospital lab), Hosp Order     Status: None   Collection Time: 04/24/19  2:58 PM   Specimen: Nasopharyngeal Swab  Result Value Ref Range   SARS Coronavirus 2 NEGATIVE NEGATIVE    Comment: (NOTE) If result is NEGATIVE SARS-CoV-2 target nucleic acids are NOT DETECTED. The SARS-CoV-2 RNA is generally detectable in upper and lower  respiratory specimens during the acute phase of infection. The lowest  concentration of SARS-CoV-2 viral copies this assay can detect is 250  copies / mL. A negative result does not preclude SARS-CoV-2 infection  and should not be used as the sole basis for treatment or other  patient management decisions.  A negative result may occur with  improper specimen collection /  handling, submission of specimen other  than nasopharyngeal swab, presence of viral mutation(s) within the  areas targeted by this assay, and inadequate number of viral copies  (<250 copies / mL). A negative result must be combined with clinical  observations, patient history, and epidemiological information. If result is POSITIVE SARS-CoV-2 target nucleic acids are DETECTED. The SARS-CoV-2 RNA is generally detectable in upper and lower  respiratory specimens dur ing the acute phase of infection.  Positive  results are indicative of  active infection with SARS-CoV-2.  Clinical  correlation with patient history and other diagnostic information is  necessary to determine patient infection status.  Positive results do  not rule out bacterial infection or co-infection with other viruses. If result is PRESUMPTIVE POSTIVE SARS-CoV-2 nucleic acids MAY BE PRESENT.   A presumptive positive result was obtained on the submitted specimen  and confirmed on repeat testing.  While 2019 novel coronavirus  (SARS-CoV-2) nucleic acids may be present in the submitted sample  additional confirmatory testing may be necessary for epidemiological  and / or clinical management purposes  to differentiate between  SARS-CoV-2 and other Sarbecovirus currently known to infect humans.  If clinically indicated additional testing with an alternate test  methodology [phone]) is advised. The SARS-CoV-2 RNA is generally  detectable in upper and lower respiratory sp ecimens during the acute  phase of infection. The expected result is Negative. Fact Sheet for Patients:  StrictlyIdeas.no Fact Sheet for Healthcare Providers: BankingDealers.co.za This test is not yet approved or cleared by the [place] FDA and has been authorized for detection and/or diagnosis of SARS-CoV-2 by FDA under an Emergency Use Authorization (EUA).  This EUA will remain in effect (meaning this test can be used) for the duration of the COVID-19 declaration under Section 564(b)(1) of the Act, 21 U.S.C. section 360bbb-3(b)(1), unless the authorization is terminated or revoked sooner. Performed at [facility], [address], [place], [place] [address]   Prepare RBC     Status: None   Collection Time: 04/24/19  3:24 PM  Result Value Ref Range   Order Confirmation      ORDER PROCESSED BY BLOOD BANK Performed at [facility], [address] [place], [place] [address]   CBC with Differential     Status: Abnormal   Collection  Time: 04/24/19  3:24 PM  Result Value Ref Range   WBC 9.1 4.0 - 10.5 K/uL   RBC 1.50 (L) 4.22 - 5.81 MIL/uL   Hemoglobin 4.8 (LL) 13.0 - 17.0 g/dL    Comment: REPEATED TO VERIFY THIS CRITICAL RESULT HAS VERIFIED AND BEEN CALLED TO L CARDWELL BY HILLARY FLYNT ON 07 14 2020 AT 1553, AND HAS BEEN READ BACK.     HCT 15.0 (L) 39.0 - 52.0 %   MCV 100.0 80.0 - 100.0 fL   MCH 32.0 26.0 - 34.0 pg   MCHC 32.0 30.0 - 36.0 g/dL   RDW 13.8 11.5 - 15.5 %   Platelets 155 150 - 400 K/uL   nRBC 0.0 0.0 - 0.2 %   Neutrophils Relative % 72 %   Neutro Abs 6.4 1.7 - 7.7 K/uL   Lymphocytes Relative 19 %   Lymphs Abs 1.8 0.7 - 4.0 K/uL   Monocytes Relative 9 %   Monocytes Absolute 0.8 0.1 - 1.0 K/uL   Eosinophils Relative 0 %   Eosinophils Absolute 0.0 0.0 - 0.5 K/uL   Basophils Relative 0 %   Basophils Absolute 0.0 0.0 - 0.1 K/uL   Immature  Granulocytes 0 %   Abs Immature Granulocytes 0.03 0.00 - 0.07 K/uL    Comment: Performed at [facility], [address] [place], [place] [address]  Basic metabolic panel     Status: Abnormal   Collection Time: 04/24/19  3:24 PM  Result Value Ref Range   Sodium 137 135 - 145 mmol/L   Potassium 4.4 3.5 - 5.1 mmol/L   Chloride 107 98 - 111 mmol/L   CO2 23 22 - 32 mmol/L   Glucose, Bld 146 (H) 70 - 99 mg/dL   BUN 97 (H) 8 - 23 mg/dL   Creatinine, Ser 3.84 (H) 0.61 - 1.24 mg/dL   Calcium 8.0 (L) 8.9 - 10.3 mg/dL   GFR calc non Af Amer 14 (L) >60 mL/min   GFR calc Af Amer 16 (L) >60 mL/min   Anion gap 7 5 - 15    Comment: Performed at [facility], [address], [place], [place] [address]  Reticulocytes     Status: Abnormal   Collection Time: 04/24/19  3:24 PM  Result Value Ref Range   Retic Ct Pct 4.8 (H) 0.4 - 3.1 %   RBC. 1.50 (L) 4.22 - 5.81 MIL/uL   Retic Count, Absolute 72.6 19.0 - 186.0 K/uL   Immature Retic Fract 15.5 2.3 - 15.9 %    Comment: Performed at [facility], [address], [place], [place] [address]  Type and screen [facility]      Status: None (Preliminary result)   Collection Time: 04/24/19  3:31 PM  Result Value Ref Range   ABO/RH(D) A POS    Antibody Screen NEG    Sample Expiration 04/27/2019,2359    Unit Number [id number]    Blood Component Type RED CELLS,LR    Unit division 00    Status of Unit ALLOCATED    Transfusion Status OK TO TRANSFUSE    Crossmatch Result Compatible    Unit Number [id number]    Blood Component Type RED CELLS,LR    Unit division 00    Status of Unit ISSUED    Transfusion Status OK TO TRANSFUSE    Crossmatch Result      Compatible Performed at [facility], [address], [place], [place] [address]   POC occult blood, ED     Status: Abnormal   Collection Time: 04/24/19  4:27 PM  Result Value Ref Range   Fecal Occult Bld POSITIVE (A) NEGATIVE   Ct Abdomen Pelvis Wo Contrast  Result Date: 04/24/2019 CLINICAL DATA:  Abdominal pain EXAM: CT ABDOMEN AND PELVIS WITHOUT CONTRAST TECHNIQUE: Multidetector CT imaging of the abdomen and pelvis was performed following the standard protocol without IV contrast. COMPARISON:  None. FINDINGS: Lower chest: Mild cardiomegaly. Coronary artery calcifications. Small bilateral pleural effusions with bibasilar atelectasis. Hepatobiliary: Small layering gallstone within the gallbladder. No focal hepatic abnormality. No biliary ductal dilatation. Pancreas: No focal abnormality or ductal dilatation. Spleen: No focal abnormality.  Normal size. Adrenals/Urinary Tract: Punctate nonobstructing stone in the lower pole of the left kidney. No hydronephrosis. No renal or adrenal mass. Urinary bladder unremarkable. Stomach/Bowel: Descending colonic and sigmoid diverticulosis. No active diverticulitis. Appendix normal. Stomach and small bowel decompressed, unremarkable. Vascular/Lymphatic: Aortic atherosclerosis. No enlarged abdominal or pelvic lymph nodes. Reproductive: No visible focal abnormality. Other: No free fluid or free air. Musculoskeletal: No acute bony  abnormality. Bilateral L5 pars defects. Degenerative disc and facet 1 disease in the lumbar spine. IMPRESSION: Punctate left lower pole nephrolithiasis.  No hydronephrosis. Small bilateral effusions.  Bibasilar atelectasis. Small layering gallstone within the gallbladder. Left colonic diverticulosis.  No active diverticulitis. Aortic atherosclerosis. Electronically Signed   By: Rolm Baptise M.D.   On: 04/24/2019 18:42   Dg Chest Portable 1 View  Result Date: 04/24/2019 CLINICAL DATA:  Anemia. EXAM: PORTABLE CHEST 1 VIEW COMPARISON:  None. FINDINGS: Mild cardiomegaly. Atherosclerosis of thoracic aorta is noted. Both lungs are clear. The visualized skeletal structures are unremarkable. IMPRESSION: No active disease. Electronically Signed   By: Marijo Conception M.D.   On: 04/24/2019 15:23    Pending Labs Unresulted Labs (From admission, onward)    Start     Ordered   04/25/19 0500  CBC WITH DIFFERENTIAL  Tomorrow morning,   R     04/24/19 1928   04/25/19 0500  Comprehensive metabolic panel  Tomorrow morning,   R     04/24/19 1928   04/24/19 2300  Hemoglobin  Once,   STAT     04/24/19 1928   04/24/19 1621  Vitamin B12  (Anemia Panel (PNL))  ONCE - STAT,   STAT     04/24/19 1620   04/24/19 1621  Folate  (Anemia Panel (PNL))  ONCE - STAT,   STAT     04/24/19 1620   04/24/19 1621  Iron and TIBC  (Anemia Panel (PNL))  ONCE - STAT,   STAT     04/24/19 1620   04/24/19 1621  Ferritin  (Anemia Panel (PNL))  ONCE - STAT,   STAT     04/24/19 1620          Vitals/Pain Today's Vitals   04/24/19 2030 04/24/19 2100 04/24/19 2130 04/24/19 2242  BP: 121/66 129/65 117/78 124/71  Pulse: 77 67 68 85  Resp: 16 15 13 16   Temp:    98.6 F (37 C)  TempSrc:    Oral  SpO2: 96% 95% 99% 97%  Weight:      Height:      PainSc:        Isolation Precautions No active isolations  Medications Medications  iohexol (OMNIPAQUE) 300 MG/ML solution 30 mL (has no administration in time range)  pantoprazole  (PROTONIX) injection 40 mg (has no administration in time range)  ondansetron (ZOFRAN) tablet 4 mg (has no administration in time range)    Or  ondansetron (ZOFRAN) injection 4 mg (has no administration in time range)  acetaminophen (TYLENOL) tablet 650 mg (has no administration in time range)    Or  acetaminophen (TYLENOL) suppository 650 mg (has no administration in time range)  0.9 %  sodium chloride infusion (0 mL/hr Intravenous Stopped 04/24/19 1918)    Mobility non-ambulatory Moderate fall risk   Focused Assessments    R Recommendations: See Admitting Provider Note  Report given to:   Additional Notes:

## 2019-04-24 NOTE — ED Provider Notes (Signed)
[facility] EMERGENCY DEPARTMENT Provider Note   CSN: [id number] Arrival date & time: 04/24/19  1405    History   Chief Complaint Chief Complaint  Patient presents with  . low hemoglobin    HPI Terry Graves. is a 84 y.o. male with history of anemia, CKD, diabetes mellitus, hypertension presenting for evaluation of gradual onset, progressively worsening weakness, sent from his nephrologist for evaluation of symptomatic anemia.  Reports that for the last 2 months or so he has had dark tarry stools.  He takes a full size aspirin but otherwise no anticoagulation.  Also reports worsening fatigue, dyspnea on exertion, lightheadedness, palpitations, and intermittently "seeing stars "when standing.  No syncope.  He has had 2 mechanical falls in the last month and was told that he had a fracture to the left hip and multiple rib fractures after being evaluated at an outside hospital.  He denies any significant pain related to this at this time.  He was seen and evaluated by his nephrologist today found to be anemic with a hemoglobin of 4.8 and sent to the ED for further evaluation.     The history is provided by the patient.    Past Medical History:  Diagnosis Date  . Anemia   . Chronic kidney disease    Stage 5  . Chronic kidney disease-mineral and bone disorder   . Hyperlipidemia   . Hypertension   . Proteinuria   . Type 2 diabetes mellitus with stage 5 chronic kidney disease [facility])     Patient Active Problem List   Diagnosis Date Noted  . Gastrointestinal hemorrhage with melena 04/24/2019  . Hypertension   . Anemia due to stage 5 chronic kidney disease ([place])   . Hyperlipidemia   . Type 2 diabetes mellitus with stage 5 chronic kidney disease ([place])     History reviewed. No pertinent surgical history.      Home Medications    Prior to Admission medications   Medication Sig Start Date End Date Taking? Authorizing Provider  aspirin 81 MG chewable tablet Chew 81 mg by  mouth every morning.    Yes Fran Lowes, MD  atorvastatin (LIPITOR) 40 MG tablet Take 40 mg by mouth every evening.    Yes Fran Lowes, MD  Darbepoetin Alfa (ARANESP) 60 MCG/0.3ML SOSY injection Inject 60 mcg into the skin every 14 (fourteen) days.   Yes [provider]  Docusate Sodium 100 MG capsule Take 100 mg by mouth 2 (two) times daily as needed for constipation.    Yes Fran Lowes, MD  fenofibrate 160 MG tablet Take 160 mg by mouth daily.   Yes Fran Lowes, MD  metoprolol tartrate (LOPRESSOR) 50 MG tablet Take 50 mg by mouth daily.   Yes Fran Lowes, MD  nitroGLYCERIN (NITROSTAT) 0.4 MG SL tablet Place 0.4 mg under the tongue every 5 (five) minutes as needed for chest pain.   Yes Fran Lowes, MD  pantoprazole (PROTONIX) 40 MG tablet Take 40 mg by mouth every morning.    Yes Fran Lowes, MD  ropinirole (REQUIP) 5 MG tablet Take 5 mg by mouth at bedtime.   Yes Fran Lowes, MD  sodium bicarbonate 650 MG tablet Take 1 tablet by mouth 2 (two) times a week.  02/13/19  Yes [provider]  terazosin (HYTRIN) 1 MG capsule Take 1 mg by mouth at bedtime.   Yes Fran Lowes, MD  tiotropium (SPIRIVA) 18 MCG inhalation capsule Place 18 mcg into inhaler and inhale daily.  Yes Fran Lowes, MD  torsemide (DEMADEX) 20 MG tablet Take 20 mg by mouth daily.   Yes Fran Lowes, MD  Vitamin D, Ergocalciferol, (DRISDOL) 1.25 MG (50000 UT) CAPS capsule Take 1 capsule by mouth 2 (two) times a week. 04/03/19  Yes [provider]    Family History History reviewed. No pertinent family history.  Social History Social History   Tobacco Use  . Smoking status: Current Every Day Smoker    Packs/day: 0.25    Types: Cigars  . Smokeless tobacco: Never Used  Substance Use Topics  . Alcohol use: Not on file  . Drug use: Never     Allergies   Penicillins   Review of Systems Review of Systems  Constitutional:  Positive for fatigue. Negative for chills and fever.  Respiratory: Positive for shortness of breath.   Cardiovascular: Positive for palpitations. Negative for chest pain.  Gastrointestinal: Positive for blood in stool. Negative for abdominal pain, nausea and vomiting.  Genitourinary: Negative for frequency and urgency.  Neurological: Positive for light-headedness. Negative for syncope.  All other systems reviewed and are negative.    Physical Exam Updated Vital Signs BP 117/78   Pulse 68   Temp 97.8 F (36.6 C) (Oral)   Resp 13   Ht 5\' 9"  (1.753 m)   Wt 90.7 kg   SpO2 99%   BMI 29.53 kg/m   Physical Exam Vitals signs and nursing note reviewed.  Constitutional:      General: He is not in acute distress.    Appearance: He is well-developed.  HENT:     Head: Normocephalic and atraumatic.  Eyes:     General:        Right eye: No discharge.        Left eye: No discharge.     Conjunctiva/sclera: Conjunctivae normal.     Comments: Pale palpebral conjunctive  Neck:     Musculoskeletal: Normal range of motion and neck supple.     Vascular: No JVD.     Trachea: No tracheal deviation.  Cardiovascular:     Rate and Rhythm: Normal rate and regular rhythm.     Comments: 1+ radial and DP/PT pulses bilaterally, 2+ pitting edema of the bilateral lower extremity Pulmonary:     Effort: Pulmonary effort is normal.     Comments: Globally diminished breath sounds, SPO2 saturation 99% on room air, speaking in full sentences without difficulty. Abdominal:     General: Bowel sounds are normal. There is no distension.     Palpations: Abdomen is soft.     Tenderness: There is abdominal tenderness in the left lower quadrant. There is no guarding or rebound.  Genitourinary:    Rectum: Guaiac result positive.     Comments: Examination performed in the presence of a chaperone.  No frank rectal bleeding.  No external hemorrhoids or anal fissures or tears.  Moderate amount of brown stool in the  rectal vault without evidence of melena or hematochezia.  Also of note, stage I decubitus ulcer to the sacrum with no induration or abnormal drainage Skin:    General: Skin is warm and dry.     Coloration: Skin is pale.     Findings: No erythema.  Neurological:     Mental Status: He is alert.  Psychiatric:        Behavior: Behavior normal.      ED Treatments / Results  Labs (all labs ordered are listed, but only abnormal results are displayed) Labs Reviewed  CBC WITH DIFFERENTIAL/PLATELET - Abnormal; Notable for the following components:      Result Value   RBC 1.50 (*)    Hemoglobin 4.8 (*)    HCT 15.0 (*)    All other components within normal limits  BASIC METABOLIC PANEL - Abnormal; Notable for the following components:   Glucose, Bld 146 (*)    BUN 97 (*)    Creatinine, Ser 3.84 (*)    Calcium 8.0 (*)    GFR calc non Af Amer 14 (*)    GFR calc Af Amer 16 (*)    All other components within normal limits  RETICULOCYTES - Abnormal; Notable for the following components:   Retic Ct Pct 4.8 (*)    RBC. 1.50 (*)    All other components within normal limits  POC OCCULT BLOOD, ED - Abnormal; Notable for the following components:   Fecal Occult Bld POSITIVE (*)    All other components within normal limits  SARS CORONAVIRUS 2 (HOSPITAL ORDER, [place] [facility])  VITAMIN B12  FOLATE  IRON AND TIBC  FERRITIN  HEMOGLOBIN  CBC WITH DIFFERENTIAL/PLATELET  COMPREHENSIVE METABOLIC PANEL  PREPARE RBC (CROSSMATCH)  TYPE AND SCREEN    EKG None  Radiology Ct Abdomen Pelvis Wo Contrast  Result Date: 04/24/2019 CLINICAL DATA:  Abdominal pain EXAM: CT ABDOMEN AND PELVIS WITHOUT CONTRAST TECHNIQUE: Multidetector CT imaging of the abdomen and pelvis was performed following the standard protocol without IV contrast. COMPARISON:  None. FINDINGS: Lower chest: Mild cardiomegaly. Coronary artery calcifications. Small bilateral pleural effusions with bibasilar  atelectasis. Hepatobiliary: Small layering gallstone within the gallbladder. No focal hepatic abnormality. No biliary ductal dilatation. Pancreas: No focal abnormality or ductal dilatation. Spleen: No focal abnormality.  Normal size. Adrenals/Urinary Tract: Punctate nonobstructing stone in the lower pole of the left kidney. No hydronephrosis. No renal or adrenal mass. Urinary bladder unremarkable. Stomach/Bowel: Descending colonic and sigmoid diverticulosis. No active diverticulitis. Appendix normal. Stomach and small bowel decompressed, unremarkable. Vascular/Lymphatic: Aortic atherosclerosis. No enlarged abdominal or pelvic lymph nodes. Reproductive: No visible focal abnormality. Other: No free fluid or free air. Musculoskeletal: No acute bony abnormality. Bilateral L5 pars defects. Degenerative disc and facet 1 disease in the lumbar spine. IMPRESSION: Punctate left lower pole nephrolithiasis.  No hydronephrosis. Small bilateral effusions.  Bibasilar atelectasis. Small layering gallstone within the gallbladder. Left colonic diverticulosis.  No active diverticulitis. Aortic atherosclerosis. Electronically Signed   By: Rolm Baptise M.D.   On: 04/24/2019 18:42   Dg Chest Portable 1 View  Result Date: 04/24/2019 CLINICAL DATA:  Anemia. EXAM: PORTABLE CHEST 1 VIEW COMPARISON:  None. FINDINGS: Mild cardiomegaly. Atherosclerosis of thoracic aorta is noted. Both lungs are clear. The visualized skeletal structures are unremarkable. IMPRESSION: No active disease. Electronically Signed   By: Marijo Conception M.D.   On: 04/24/2019 15:23    Procedures .Critical Care Performed by: Renita Papa, PA-C Authorized by: Renita Papa, PA-C   Critical care provider statement:    Critical care time (minutes):  40   Critical care was necessary to treat or prevent imminent or life-threatening deterioration of the following conditions:  Circulatory failure   Critical care was time spent personally by me on the following  activities:  Discussions with consultants, evaluation of patient's response to treatment, examination of patient, ordering and performing treatments and interventions, ordering and review of laboratory studies, ordering and review of radiographic studies, pulse oximetry, re-evaluation of patient's condition, obtaining history from patient or surrogate and  review of old charts   I assumed direction of critical care for this patient from another provider in my specialty: no     (including critical care time)  Medications Ordered in ED Medications  iohexol (OMNIPAQUE) 300 MG/ML solution 30 mL (has no administration in time range)  pantoprazole (PROTONIX) injection 40 mg (has no administration in time range)  ondansetron (ZOFRAN) tablet 4 mg (has no administration in time range)    Or  ondansetron (ZOFRAN) injection 4 mg (has no administration in time range)  acetaminophen (TYLENOL) tablet 650 mg (has no administration in time range)    Or  acetaminophen (TYLENOL) suppository 650 mg (has no administration in time range)  0.9 %  sodium chloride infusion (0 mL/hr Intravenous Stopped 04/24/19 1918)     Initial Impression / Assessment and Plan / ED Course  I have reviewed the triage vital signs and the nursing notes.  Pertinent labs & imaging results that were available during my care of the patient were reviewed by me and considered in my medical decision making (see chart for details).        Patient presenting for evaluation of low hemoglobin sent from his nephrologist.  He is afebrile, vital signs are stable.  He is nontoxic in appearance.  He reports history of dark tarry stools for the last month, fatigue, dyspnea on exertion, palpitations.  Today's hemoglobin is 4.8 and he has been positive stool suggestive of possible GI bleed although the stool itself does not appear melanotic.  He did have some left lower quadrant abdominal pain so a noncontrast CT scan (due to the patient's renal  insufficiency) was obtained which shows no evidence of diverticulitis.  Remainder of blood work reviewed by me shows no leukocytosis, CKD at patient's baseline.  He is COVID negative.    6:55 PM Spoke with Dr. Oneida Alar with gastroenterology in consultation, relayed the patient's work-up and findings; as the patient is hemodynamically stable, Dr. Laural Golden will see the patient in consultation tomorrow.    I spoke with Dr. Olevia Bowens with tried hospitalist service who agrees to assume care patient and bring him into the hospital for further evaluation and management.  Will transfuse 2 units packed red blood cells in the ED.  Patient resting comfortably in no apparent distress on reevaluation.  Final Clinical Impressions(s) / ED Diagnoses   Final diagnoses:  Symptomatic anemia  Heme positive stool  Renal insufficiency    ED Discharge Orders    None       Debroah Baller 04/24/19 2140    Margette Fast, MD 04/25/19 0830

## 2019-04-24 NOTE — H&P (Signed)
History and Physical    Terry Graves. QIW:[id number] DOB: Jun 08, 1935 DOA: 04/24/2019  PCP: [name], MD   Patient coming from: Home.  I have personally briefly reviewed patient's old medical records in [place]  Chief Complaint: Low blood count.  HPI: Terry Graves. is a 84 y.o. 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  Patient also states that he has been feeling progressively weaker and dyspneic with exertion in the past 2 weeks.  He had an episode of melanotic stools this morning, but has been having on and off melena for the past 2 months.  He denies using large doses of aspirin or NSAIDs.  He has been frequently lightheaded with palpitations, occasionally "seeing stars "when he gets out of bed or stands up from a chair.  Due to the weakness, he has had 2 mechanical falls in the past 2 months.  He saw for left hip and multiple rib fractures and was seen at another facility.  He denies fever, chills, sore throat or rhinorrhea.  No productive cough, wheezing or hemoptysis.  Denies chest pain, but state he has frequent lower extremity edema.  No PND or orthopnea.  Denies abdominal pain, nausea or emesis.  No dysuria, frequency or hematuria.  No polyuria, polydipsia, polyphagia or blurred vision.  ED Course: Initial vital signs were temperature 97.5 F, pulse 80, respiration 18, blood pressure 104/52 mmHg and O2 sat 98% on room air.  The patient received a PRBC unit in the emergency department.  The ED provider contacted gastroenterology on call.  White count is 9.1, hemoglobin 4.8 g/dL and platelets 155.  BMP shows normal electrolytes except for calcium of 8.0 mg/dL.  No albumin available to calculate calcium correction.  Glucose 146,  BUN 97 creatinine 3.84 mg/dL.  Fecal occult blood was positive.  Imaging: CT abdomen/pelvis without contrast show punctate left lower pole nephrolithiasis without hydronephrosis.  Small bilateral effusions and bibasilar atelectasis, small layering gallstones within the gallbladder.  Left colonic diverticulosis without diverticulitis.  Aortic atherosclerosis.  Please see images and full radiology report for further details.  Review of Systems: As per HPI otherwise 10 point review of systems negative.   Past Medical History:  Diagnosis Date  . Anemia   . Chronic kidney disease    Stage 5  . Chronic kidney disease-mineral and bone disorder   . Hyperlipidemia   . Hypertension   . Proteinuria   . Type 2 diabetes mellitus with stage 5 chronic kidney disease ([place])     History reviewed. No pertinent surgical history.   reports that he has been smoking cigars. He has been smoking about 0.25 packs per day. He has never used smokeless tobacco. He reports that he does not use drugs. No history on file for alcohol.  Allergies  Allergen Reactions  . Penicillins Rash    Did it involve swelling of the face/tongue/throat, SOB, or low BP? No Did it involve sudden or severe rash/hives, skin peeling, or any reaction on the inside of your mouth or nose? Yes Did you need to seek medical attention at a hospital or doctor's office? Unknown When did it last happen?60 years If all above answers are "NO", may proceed with cephalosporin use.   Family medical history Father lung cancer.  Prior to Admission medications   Medication Sig Start Date End Date Taking? Authorizing Provider  aspirin 81 MG chewable tablet Chew 81 mg by mouth every morning.    Yes Fran Lowes, MD  atorvastatin (LIPITOR) 40 MG tablet Take 40 mg by mouth every evening.    Yes Fran Lowes, MD  Darbepoetin Alfa (ARANESP) 60 MCG/0.3ML SOSY injection Inject 60 mcg into the skin every 14 (fourteen) days.   Yes [provider]  Docusate Sodium 100 MG capsule Take 100 mg by mouth 2 (two) times daily as needed for constipation.    Yes Fran Lowes, MD  fenofibrate 160 MG tablet Take 160 mg by mouth daily.   Yes Fran Lowes, MD  metoprolol tartrate (LOPRESSOR) 50 MG tablet Take 50 mg by mouth daily.   Yes Fran Lowes, MD  nitroGLYCERIN (NITROSTAT) 0.4 MG SL tablet Place 0.4 mg under the tongue every 5 (five) minutes as needed for chest pain.   Yes Fran Lowes, MD  pantoprazole (PROTONIX) 40 MG tablet Take 40 mg by mouth every morning.    Yes Fran Lowes, MD  ropinirole (REQUIP) 5 MG tablet Take 5 mg by mouth at bedtime.   Yes Fran Lowes, MD  sodium bicarbonate 650 MG tablet Take 1 tablet by mouth 2 (two) times a week.  02/13/19  Yes [provider]  terazosin (HYTRIN) 1 MG capsule Take 1 mg by mouth at bedtime.   Yes Fran Lowes, MD  tiotropium (SPIRIVA) 18 MCG inhalation capsule Place 18 mcg into inhaler and inhale daily.   Yes Fran Lowes, MD  torsemide (DEMADEX) 20 MG tablet Take 20 mg by mouth daily.   Yes Fran Lowes, MD  Vitamin D, Ergocalciferol, (DRISDOL) 1.25 MG (50000 UT) CAPS capsule Take 1 capsule by mouth 2 (two) times a week. 04/03/19  Yes [provider]    Physical Exam: Vitals:   04/24/19 1730 04/24/19 1800 04/24/19 1845 04/24/19 1927  BP: 116/60 (!) 113/59 113/61 122/64  Pulse: 68 66 66 67  Resp: 18 16 14 16   Temp:   97.8 F (36.6 C) 97.8 F (36.6 C)  TempSrc:   Oral Oral  SpO2: 99% 98% 96% 97%  Weight:      Height:        Constitutional: NAD, calm, comfortable Eyes: PERRL, lids and conjunctivae are pale. ENMT: Mucous membranes are moist. Posterior pharynx clear of any exudate or lesions. Neck: normal, supple, no masses, no thyromegaly Respiratory: clear to auscultation bilaterally, no wheezing, no crackles. Normal respiratory effort. No accessory muscle use.  Cardiovascular: Regular rate and  rhythm, no murmurs / rubs / gallops. No extremity edema. 2+ pedal pulses. No carotid bruits.  Abdomen: Soft, mild epigastric tenderness, no guarding/rebound/masses palpated. No hepatosplenomegaly. Bowel sounds positive.  Musculoskeletal: no clubbing / cyanosis. Good ROM, no contractures. Normal muscle tone.  Skin: Skin looks pale. Neurologic: CN 2-12 grossly intact. Sensation intact, DTR normal. Strength 5/5 in all 4.  Psychiatric: Normal judgment and insight. Alert and oriented x 3. Normal mood.   Labs on Admission: I have personally reviewed following labs and imaging studies  CBC: Recent Labs  Lab 04/24/19 1327 04/24/19 1328 04/24/19 1343 04/24/19 1524  WBC  --   --   --  9.1  NEUTROABS  --   --   --  6.4  HGB 4.8* 5.0* 4.9* 4.8*  HCT  --   --  15.0* 15.0*  MCV  --   --   --  100.0  PLT  --   --   --  419   Basic Metabolic Panel: Recent Labs  Lab 04/24/19 1524  NA 137  K 4.4  CL 107  CO2 23  GLUCOSE 146*  BUN 97*  CREATININE 3.84*  CALCIUM 8.0*   GFR: Estimated Creatinine Clearance: 16.8 mL/min (A) (by C-G formula based on SCr of 3.84 mg/dL (H)). Liver Function Tests: No results for input(s): AST, ALT, ALKPHOS, BILITOT, PROT, ALBUMIN in the last 168 hours. No results for input(s): LIPASE, AMYLASE in the last 168 hours. No results for input(s): AMMONIA in the last 168 hours. Coagulation Profile: No results for input(s): INR, PROTIME in the last 168 hours. Cardiac Enzymes: No results for input(s): CKTOTAL, CKMB, CKMBINDEX, TROPONINI in the last 168 hours. BNP (last 3 results) No results for input(s): PROBNP in the last 8760 hours. HbA1C: No results for input(s): HGBA1C in the last 72 hours. CBG: No results for input(s): GLUCAP in the last 168 hours. Lipid Profile: No results for input(s): CHOL, HDL, LDLCALC, TRIG, CHOLHDL, LDLDIRECT in the last 72 hours. Thyroid Function Tests: No results for input(s): TSH, T4TOTAL, FREET4, T3FREE, THYROIDAB in the last 72  hours. Anemia Panel: Recent Labs    04/24/19 1524  RETICCTPCT 4.8*   Urine analysis: No results found for: COLORURINE, APPEARANCEUR, LABSPEC, PHURINE, GLUCOSEU, HGBUR, BILIRUBINUR, KETONESUR, PROTEINUR, UROBILINOGEN, NITRITE, LEUKOCYTESUR  Radiological Exams on Admission: Ct Abdomen Pelvis Wo Contrast  Result Date: 04/24/2019 CLINICAL DATA:  Abdominal pain EXAM: CT ABDOMEN AND PELVIS WITHOUT CONTRAST TECHNIQUE: Multidetector CT imaging of the abdomen and pelvis was performed following the standard protocol without IV contrast. COMPARISON:  None. FINDINGS: Lower chest: Mild cardiomegaly. Coronary artery calcifications. Small bilateral pleural effusions with bibasilar atelectasis. Hepatobiliary: Small layering gallstone within the gallbladder. No focal hepatic abnormality. No biliary ductal dilatation. Pancreas: No focal abnormality or ductal dilatation. Spleen: No focal abnormality.  Normal size. Adrenals/Urinary Tract: Punctate nonobstructing stone in the lower pole of the left kidney. No hydronephrosis. No renal or adrenal mass. Urinary bladder unremarkable. Stomach/Bowel: Descending colonic and sigmoid diverticulosis. No active diverticulitis. Appendix normal. Stomach and small bowel decompressed, unremarkable. Vascular/Lymphatic: Aortic atherosclerosis. No enlarged abdominal or pelvic lymph nodes. Reproductive: No visible focal abnormality. Other: No free fluid or free air. Musculoskeletal: No acute bony abnormality. Bilateral L5 pars defects. Degenerative disc and facet 1 disease in the lumbar spine. IMPRESSION: Punctate left lower pole nephrolithiasis.  No hydronephrosis. Small bilateral effusions.  Bibasilar atelectasis. Small layering gallstone within the gallbladder. Left colonic diverticulosis.  No active diverticulitis. Aortic atherosclerosis. Electronically Signed   By: Rolm Baptise M.D.   On: 04/24/2019 18:42   Dg Chest Portable 1 View  Result Date: 04/24/2019 CLINICAL DATA:  Anemia.  EXAM: PORTABLE CHEST 1 VIEW COMPARISON:  None. FINDINGS: Mild cardiomegaly. Atherosclerosis of thoracic aorta is noted. Both lungs are clear. The visualized skeletal structures are unremarkable. IMPRESSION: No active disease. Electronically Signed   By: Marijo Conception M.D.   On: 04/24/2019 15:23    EKG: Independently reviewed.   Assessment/Plan Principal Problem:   Gastrointestinal hemorrhage with melena Admit to stepdown/inpatient. Keep n.p.o. Monitor H&H. Transfuse as needed. Pantoprazole 40 mg IVP every 12 hours. Consult gastroenterology.  Active Problems:   Hypertension Hold antihypertensives for now. Monitor blood pressure. Resume antihypertensives once BP starts trending up.    Anemia due to stage 5 chronic kidney disease ([place]) Worsened by acute blood loss. Transfuse as needed. Erythropoietin per nephrology.    Hyperlipidemia Resume atorvastatin once clear  for oral intake.    Type 2 diabetes mellitus with stage 5 chronic kidney disease ([facility]) Currently n.p.o. Monitor CBG.   DVT prophylaxis: SCDs. Code Status: Full code. Family Communication: Disposition Plan: Admit for close monitoring/treatment, PRBC transfusion and GI evaluation in a.m. Consults called: Routine gastroenterology counseling a.m. Admission status: Inpatient/stepdown.   Reubin Milan MD [facility]  04/24/2019, 7:58 PM   This document was prepared using Dragon voice recognition software and may contain some unintended transcription errors.

## 2019-04-24 NOTE — Progress Notes (Signed)
Patient arrived to short stay for labs and Aranesp injection, upon checking HGB with hemacue results 4.9 repeat test done 5.0. Patient c/o of weakness, dark stools after a  recent fall resulting in questionable hip fracture along with sore ribs. Vital signs stable, spoke to [facility] in the ED, a long with daughters and Dr. Lowanda Foster office. Lab sent for H&H along with a hold tube. Transported to ED

## 2019-04-24 NOTE — ED Notes (Addendum)
Date and time results received: 04/24/19 **1552 (use smartphrase ".now" to insert current time)  Test: hgb Critical Value: 4.8  Name of Provider Notified: Mina,PA  Orders Received? Or Actions Taken?:

## 2019-04-24 NOTE — ED Triage Notes (Signed)
Pt sent from day surgery.  Reports hemoglobin 4.9.  Pt comes to day surgery clinic for hemoglobin checks and arinesp injections.  Pt alert and oriented.  Reports had a fall recently and hurt left hip, chest, and tail bone but says is getting better.  Pt pale, reports feeling weak.

## 2019-04-25 ENCOUNTER — Encounter (HOSPITAL_COMMUNITY): Payer: Self-pay | Admitting: *Deleted

## 2019-04-25 ENCOUNTER — Encounter (HOSPITAL_COMMUNITY)
Admission: EM | Disposition: A | Discharge status: Home Health | Payer: Self-pay | Source: Ambulatory Visit | Attending: Internal Medicine

## 2019-04-25 DIAGNOSIS — K259 Gastric ulcer, unspecified as acute or chronic, without hemorrhage or perforation: Secondary | ICD-10-CM | POA: Diagnosis not present

## 2019-04-25 DIAGNOSIS — E782 Mixed hyperlipidemia: Secondary | ICD-10-CM

## 2019-04-25 DIAGNOSIS — K228 Other specified diseases of esophagus: Secondary | ICD-10-CM | POA: Diagnosis not present

## 2019-04-25 DIAGNOSIS — R195 Other fecal abnormalities: Secondary | ICD-10-CM

## 2019-04-25 DIAGNOSIS — K921 Melena: Secondary | ICD-10-CM

## 2019-04-25 DIAGNOSIS — N185 Chronic kidney disease, stage 5: Secondary | ICD-10-CM

## 2019-04-25 DIAGNOSIS — D649 Anemia, unspecified: Secondary | ICD-10-CM

## 2019-04-25 DIAGNOSIS — D631 Anemia in chronic kidney disease: Secondary | ICD-10-CM

## 2019-04-25 DIAGNOSIS — K2970 Gastritis, unspecified, without bleeding: Secondary | ICD-10-CM | POA: Diagnosis not present

## 2019-04-25 DIAGNOSIS — D62 Acute posthemorrhagic anemia: Secondary | ICD-10-CM

## 2019-04-25 HISTORY — PX: ESOPHAGOGASTRODUODENOSCOPY: SHX5428

## 2019-04-25 LAB — CBC WITH DIFFERENTIAL/PLATELET
Abs Immature Granulocytes: 0.02 10*3/uL (ref 0.00–0.07)
Basophils Absolute: 0 10*3/uL (ref 0.0–0.1)
Basophils Relative: 0 %
Eosinophils Absolute: 0 10*3/uL (ref 0.0–0.5)
Eosinophils Relative: 0 %
HCT: 24.4 % — ABNORMAL LOW (ref 39.0–52.0)
Hemoglobin: 8 g/dL — ABNORMAL LOW (ref 13.0–17.0)
Immature Granulocytes: 0 %
Lymphocytes Relative: 19 %
Lymphs Abs: 1.6 10*3/uL (ref 0.7–4.0)
MCH: 30.2 pg (ref 26.0–34.0)
MCHC: 32.8 g/dL (ref 30.0–36.0)
MCV: 92.1 fL (ref 80.0–100.0)
Monocytes Absolute: 0.6 10*3/uL (ref 0.1–1.0)
Monocytes Relative: 7 %
Neutro Abs: 6.1 10*3/uL (ref 1.7–7.7)
Neutrophils Relative %: 74 %
Platelets: 146 10*3/uL — ABNORMAL LOW (ref 150–400)
RBC: 2.65 MIL/uL — ABNORMAL LOW (ref 4.22–5.81)
RDW: 16.6 % — ABNORMAL HIGH (ref 11.5–15.5)
WBC: 8.3 10*3/uL (ref 4.0–10.5)
nRBC: 0 % (ref 0.0–0.2)

## 2019-04-25 LAB — GLUCOSE, CAPILLARY
Glucose-Capillary: 95 mg/dL (ref 70–99)
Glucose-Capillary: 114 mg/dL — ABNORMAL HIGH (ref 70–99)
Glucose-Capillary: 104 mg/dL — ABNORMAL HIGH (ref 70–99)
Glucose-Capillary: 94 mg/dL (ref 70–99)
Glucose-Capillary: 97 mg/dL (ref 70–99)
Glucose-Capillary: 107 mg/dL — ABNORMAL HIGH (ref 70–99)
Glucose-Capillary: 99 mg/dL (ref 70–99)

## 2019-04-25 LAB — COMPREHENSIVE METABOLIC PANEL
ALT: 18 U/L (ref 0–44)
AST: 17 U/L (ref 15–41)
Albumin: 2.6 g/dL — ABNORMAL LOW (ref 3.5–5.0)
Alkaline Phosphatase: 69 U/L (ref 38–126)
Anion gap: 9 (ref 5–15)
BUN: 94 mg/dL — ABNORMAL HIGH (ref 8–23)
CO2: 24 mmol/L (ref 22–32)
Calcium: 8.5 mg/dL — ABNORMAL LOW (ref 8.9–10.3)
Chloride: 105 mmol/L (ref 98–111)
Creatinine, Ser: 3.43 mg/dL — ABNORMAL HIGH (ref 0.61–1.24)
GFR calc Af Amer: 18 mL/min — ABNORMAL LOW (ref >60)
GFR calc non Af Amer: 16 mL/min — ABNORMAL LOW (ref >60)
Glucose, Bld: 102 mg/dL — ABNORMAL HIGH (ref 70–99)
Potassium: 4.6 mmol/L (ref 3.5–5.1)
Sodium: 138 mmol/L (ref 135–145)
Total Bilirubin: 0.9 mg/dL (ref 0.3–1.2)
Total Protein: 5.4 g/dL — ABNORMAL LOW (ref 6.5–8.1)

## 2019-04-25 LAB — FOLATE: Folate: 37.5 ng/mL (ref >5.9)

## 2019-04-25 LAB — PREPARE RBC (CROSSMATCH)

## 2019-04-25 LAB — MRSA PCR SCREENING: MRSA by PCR: NEGATIVE

## 2019-04-25 SURGERY — EGD (ESOPHAGOGASTRODUODENOSCOPY)
Anesthesia: Moderate Sedation

## 2019-04-25 MED ORDER — MEPERIDINE HCL 50 MG/ML IJ SOLN
INTRAMUSCULAR | Status: AC
  Filled 2019-04-25: qty 1

## 2019-04-25 MED ORDER — FENTANYL CITRATE (PF) 100 MCG/2ML IJ SOLN
INTRAMUSCULAR | Status: DC | PRN
  Administered 2019-04-25: 15 ug via INTRAVENOUS

## 2019-04-25 MED ORDER — UMECLIDINIUM BROMIDE 62.5 MCG/INH IN AEPB
1.0000 | INHALATION_SPRAY | Freq: Every day | RESPIRATORY_TRACT | Status: DC
  Administered 2019-04-25 – 2019-04-27 (×3): 1 via RESPIRATORY_TRACT
  Filled 2019-04-25: qty 7

## 2019-04-25 MED ORDER — MIDAZOLAM HCL 5 MG/5ML IJ SOLN
INTRAMUSCULAR | Status: AC
  Filled 2019-04-25: qty 10

## 2019-04-25 MED ORDER — FENTANYL CITRATE (PF) 100 MCG/2ML IJ SOLN
INTRAMUSCULAR | Status: AC
  Filled 2019-04-25: qty 2

## 2019-04-25 MED ORDER — CHLORHEXIDINE GLUCONATE CLOTH 2 % EX PADS
6.0000 | MEDICATED_PAD | Freq: Every day | CUTANEOUS | Status: DC
  Administered 2019-04-25 – 2019-04-27 (×3): 6 via TOPICAL

## 2019-04-25 MED ORDER — INSULIN ASPART 100 UNIT/ML ~~LOC~~ SOLN: 0.0000 [IU] | SUBCUTANEOUS | Status: DC

## 2019-04-25 MED ORDER — SODIUM CHLORIDE 0.9 % IV SOLN
INTRAVENOUS | Status: DC
  Administered 2019-04-25: 1000 mL via INTRAVENOUS

## 2019-04-25 MED ORDER — INSULIN ASPART 100 UNIT/ML ~~LOC~~ SOLN
0.0000 [IU] | Freq: Three times a day (TID) | SUBCUTANEOUS | Status: DC
  Administered 2019-04-26: 2 [IU] via SUBCUTANEOUS

## 2019-04-25 MED ORDER — TERAZOSIN HCL 1 MG PO CAPS
1.0000 mg | ORAL_CAPSULE | Freq: Every day | ORAL | Status: DC
  Administered 2019-04-25 – 2019-04-26 (×2): 1 mg via ORAL
  Filled 2019-04-25 (×2): qty 1

## 2019-04-25 MED ORDER — ATORVASTATIN CALCIUM 40 MG PO TABS
40.0000 mg | ORAL_TABLET | Freq: Every day | ORAL | Status: DC
  Administered 2019-04-25 – 2019-04-26 (×2): 40 mg via ORAL
  Filled 2019-04-25 (×2): qty 1

## 2019-04-25 MED ORDER — FENOFIBRATE 160 MG PO TABS
160.0000 mg | ORAL_TABLET | Freq: Every day | ORAL | Status: DC
  Administered 2019-04-26 – 2019-04-27 (×2): 160 mg via ORAL
  Filled 2019-04-25 (×6): qty 1

## 2019-04-25 MED ORDER — MIDAZOLAM HCL 5 MG/5ML IJ SOLN
INTRAMUSCULAR | Status: DC | PRN
  Administered 2019-04-25: 0.5 mg via INTRAVENOUS
  Administered 2019-04-25: 1 mg via INTRAVENOUS

## 2019-04-25 MED ORDER — LIDOCAINE VISCOUS HCL 2 % MT SOLN
OROMUCOSAL | Status: AC
  Filled 2019-04-25: qty 15

## 2019-04-25 MED ORDER — LIDOCAINE VISCOUS HCL 2 % MT SOLN
OROMUCOSAL | Status: DC | PRN
  Administered 2019-04-25: 4 mL via OROMUCOSAL

## 2019-04-25 MED ORDER — TORSEMIDE 20 MG PO TABS
20.0000 mg | ORAL_TABLET | Freq: Every day | ORAL | Status: DC
  Administered 2019-04-26 – 2019-04-27 (×2): 20 mg via ORAL
  Filled 2019-04-25 (×2): qty 1

## 2019-04-25 MED ORDER — STERILE WATER FOR IRRIGATION IR SOLN
Status: DC | PRN
  Administered 2019-04-25: 2.5 mL

## 2019-04-25 NOTE — Op Note (Signed)
[facility] Patient Name: Terry Graves Procedure Date: 04/25/2019 2:51 PM MRN: [id number] Date of Birth: Sep 11, 1935 Attending MD: Hildred Laser , MD CSN: [id number] Age: 84 Admit Type: Outpatient Procedure:                Upper GI endoscopy Indications:              Melena Providers:                Hildred Laser, MD, Jeanann Lewandowsky. Sharon Seller, RN, Raphael Gibney Tech., Technician, Aram Candela Referring MD:             Orson Eva, DO Medicines:                Lidocaine spray, Fentanyl 15 micrograms IV,                            Midazolam 1.5 mg IV Complications:            No immediate complications. Estimated Blood Loss:     Estimated blood loss: none. Procedure:                Pre-Anesthesia Assessment:                           - Prior to the procedure, a History and Physical                            was performed, and patient medications and                            allergies were reviewed. The patient's tolerance of                            previous anesthesia was also reviewed. The risks                            and benefits of the procedure and the sedation                            options and risks were discussed with the patient.                            All questions were answered, and informed consent                            was obtained. Prior Anticoagulants: The patient has                            taken no previous anticoagulant or antiplatelet                            agents except for aspirin. ASA Grade Assessment:  III - A patient with severe systemic disease. After                            reviewing the risks and benefits, the patient was                            deemed in satisfactory condition to undergo the                            procedure.                           After obtaining informed consent, the endoscope was                            passed under direct vision. Throughout the                             procedure, the patient's blood pressure, pulse, and                            oxygen saturations were monitored continuously. The                            [id number]-[id number] ([id number]) scope was introduced through the                            mouth, and advanced to the second part of duodenum.                            The upper GI endoscopy was accomplished without                            difficulty. The patient tolerated the procedure                            well. Scope In: 3:25:44 PM Scope Out: 3:32:05 PM Total Procedure Duration: 0 hours 6 minutes 21 seconds  Findings:      The examined esophagus was normal.      The Z-line was irregular and was found 40 cm from the incisors.      Localized mild inflammation characterized by congestion (edema) and       erythema was found in the gastric antrum.      One non-bleeding superficial gastric ulcer with no stigmata of bleeding       was found in the prepyloric region of the stomach. The lesion was 6 mm       in largest dimension.      The exam of the stomach was otherwise normal.      The duodenal bulb and second portion of the duodenum were normal. Impression:               - Normal esophagus.                           - Z-line irregular, 40 cm from  the incisors.                           - Gastritis.                           - Non-bleeding gastric ulcer with no stigmata of                            bleeding.                           - Normal duodenal bulb and second portion of the                            duodenum.                           - No specimens collected. Moderate Sedation:      Moderate (conscious) sedation was administered by the endoscopy nurse       and supervised by the endoscopist. The following parameters were       monitored: oxygen saturation, heart rate, blood pressure, CO2       capnography and response to care. Total physician intraservice time was       10  minutes. Recommendation:           - Return patient to ICU for ongoing care.                           - Diabetic (ADA) diet today.                           - Continue present medications.                           - Hold Aspirin for one week and therafter resume at                            low dose.                           - H. Pylori serolgy. Procedure Code(s):        --- Professional ---                           [phone], Esophagogastroduodenoscopy, flexible,                            transoral; diagnostic, including collection of                            specimen(s) by brushing or washing, when performed                            (separate procedure)                           G0500, Moderate sedation services provided by the  same physician or other qualified health care                            professional performing a gastrointestinal                            endoscopic service that sedation supports,                            requiring the presence of an independent trained                            observer to assist in the monitoring of the                            patient's level of consciousness and physiological                            status; initial 15 minutes of intra-service time;                            patient age 64 years or older (additional time may                            be reported with [phone], as appropriate) Diagnosis Code(s):        --- Professional ---                           K22.8, Other specified diseases of esophagus                           K29.70, Gastritis, unspecified, without bleeding                           K25.9, Gastric ulcer, unspecified as acute or                            chronic, without hemorrhage or perforation                           K92.1, Melena (includes Hematochezia) CPT copyright 2019 American Medical Association. All rights reserved. The codes documented in this report are  preliminary and upon coder review may  be revised to meet current compliance requirements. Hildred Laser, MD Hildred Laser, MD 04/25/2019 3:51:35 PM This report has been signed electronically. Number of Addenda: 0

## 2019-04-25 NOTE — TOC Progression Note (Signed)
Transition of Care (TOC) - Progression Note    Patient Details  Name: Terry Graves. MRN: [id number] Date of Birth: 1935/02/01  Transition of Care [facility]) CM/SW Contact  Ihor Gully, LCSW Phone Number: 04/25/2019, 4:31 PM  Clinical Narrative:    Patient admitted for Acute blood loss anemia, melena, anemia of CKD, CKD stage V, hyperlipidemia, essential hypertension, stage 2 sacral ulcer.  Patient has multiple forms of DME consisting of rollator, cane, wc, shower chair, ramps, shower bar, cameras in the home, a rail on the side of the bed so that he can get up, devices for for family to hold on to so he can pull up. Patient also has a lift chair. At baseline, patient uses a cane, however he fell 3-4 weeks ago and fx his hip and began using the rollator.  Daughter, Vaughan Basta, whom he lives with advised that a PT consult was placed and potential recommendation were discussed. Daughter stated that she feels that patient would be more comfortable doing therapy in the home.  She stated that they have purchased arm weights for him to exercise. LCSW discussed that once patient has the PT evaluation, his performance would be discussed with her so that an informed decision could be made regarding next level of care. Vaughan Basta was agreeable.    Expected Discharge Plan: [place] Barriers to Discharge: No Barriers Identified  Expected Discharge Plan and Services Expected Discharge Plan: [place] In-house Referral: Clinical Social Work   Post Acute Care Choice: [place] arrangements for the past 2 months: Single Family Home                                       Social Determinants of Health (SDOH) Interventions    Readmission Risk Interventions No flowsheet data found.

## 2019-04-25 NOTE — Consult Note (Addendum)
Reason for [place] Referring Physician:  Hospitalist  HPI:  Terry Graves. is an 84 y.o. male with a past medical history significant for CAD, HTN, DM type II, hyperlipidemia, CKD stage IV followed by Dr. Lowanda Foster, anemia and recent fall x 2 resulting in a left hip injury. He receives  Procrit 10,000 units SQ every 2 weeks, however, he missed 2 or 3 injections since his hip injury as he was unable to drive to to the clinic  for his injections. He was seen in the nephrology clinic yesterday and his Hg level was 4.8. FOBT was +. He was sent directly to the ED for further evaluation. He reports seeing black solid stool most days for the past 6 months. He has infrequent loose black stool. No hematemesis. He takes ASA 325mg  daily. He has occasional heartburn at night which occurs 1 - 2 times weekly which he associates to using CPAP for sleep apnea. No dysphagia. He is taking Protonix 40mg  once daily. He denies having any upper abdominal pain. He has occasional lower abdominal pain when constipated. Recent mid chest wall pain which occurred after his recent falls. No current chest pain. He had mild SOB yesterday. No current SOB. No cough. He underwent an EGD and colonoscopy by Dr. Earley Brooke in [place] 2 or 3 years ago. He reported the EGD identified bleeding stomach ulcers. He stated the colonoscopy was normal. I will request a copy of his EGD and colonoscopy procedure and biopsy results.   ED Course: WBC 9.1. Hg 4.8 (base line Hg  7.8 on 03/21/19)  HCT 15. MCV 100. PLT 155. Na 13. BUN 97 (baseline BUN 76). Cr. 3.84.  Glu 146. Ca 8.0. Anion gap 7.0. B12 244. Iron 61. TIBC 245. SARS Corona Virus negative.   Abdominal/Pelvic CT without contrast: Punctate left lower pole nephrolithiasis.  No hydronephrosis.Small bilateral effusions.  Bibasilar atelectasis. Small layering gallstone within the gallbladder.Left colonic diverticulosis.  No active diverticulitis.Aortic atherosclerosis.  He received 3  units of PRBCs, Post transfusion H/H pending  Last dose of ASA was 04/24/2019  He reports having a history of CAD, followed by cardiologist,  Dr. Alroy Dust. He stated having a "dead artery" 35 years ago.   Past Medical History:  Diagnosis Date  . Anemia   . Chronic kidney disease    Stage 5  . Chronic kidney disease-mineral and bone disorder   . Hyperlipidemia   . Hypertension   . Proteinuria   . Type 2 diabetes mellitus with stage 5 chronic kidney disease ([place])     History reviewed. No pertinent surgical history.  Family History  Problem Relation Age of Onset  . Lung cancer Father     Social History:  reports that he has been smoking cigars. He has been smoking about 0.25 packs per day. He has never used smokeless tobacco. He reports that he does not use drugs. No history on file for alcohol.  Allergies:  Allergies  Allergen Reactions  . Penicillins Rash    Did it involve swelling of the face/tongue/throat, SOB, or low BP? No Did it involve sudden or severe rash/hives, skin peeling, or any reaction on the inside of your mouth or nose? Yes Did you need to seek medical attention at a hospital or doctor's office? Unknown When did it last happen?60 years If all above answers are "NO", may proceed with cephalosporin use.    Medications: I have reviewed the patient's current medications.  Results for orders placed or performed during the hospital encounter  of 04/24/19 (from the past 48 hour(s))  SARS Coronavirus 2 (CEPHEID - Performed in [facility] hospital lab), Hosp Order     Status: None   Collection Time: 04/24/19  2:58 PM   Specimen: Nasopharyngeal Swab  Result Value Ref Range   SARS Coronavirus 2 NEGATIVE NEGATIVE    Comment: (NOTE) If result is NEGATIVE SARS-CoV-2 target nucleic acids are NOT DETECTED. The SARS-CoV-2 RNA is generally detectable in upper and lower  respiratory specimens during the acute phase of infection. The lowest  concentration of  SARS-CoV-2 viral copies this assay can detect is 250  copies / mL. A negative result does not preclude SARS-CoV-2 infection  and should not be used as the sole basis for treatment or other  patient management decisions.  A negative result may occur with  improper specimen collection / handling, submission of specimen other  than nasopharyngeal swab, presence of viral mutation(s) within the  areas targeted by this assay, and inadequate number of viral copies  (<250 copies / mL). A negative result must be combined with clinical  observations, patient history, and epidemiological information. If result is POSITIVE SARS-CoV-2 target nucleic acids are DETECTED. The SARS-CoV-2 RNA is generally detectable in upper and lower  respiratory specimens dur ing the acute phase of infection.  Positive  results are indicative of active infection with SARS-CoV-2.  Clinical  correlation with patient history and other diagnostic information is  necessary to determine patient infection status.  Positive results do  not rule out bacterial infection or co-infection with other viruses. If result is PRESUMPTIVE POSTIVE SARS-CoV-2 nucleic acids MAY BE PRESENT.   A presumptive positive result was obtained on the submitted specimen  and confirmed on repeat testing.  While 2019 novel coronavirus  (SARS-CoV-2) nucleic acids may be present in the submitted sample  additional confirmatory testing may be necessary for epidemiological  and / or clinical management purposes  to differentiate between  SARS-CoV-2 and other Sarbecovirus currently known to infect humans.  If clinically indicated additional testing with an alternate test  methodology [phone]) is advised. The SARS-CoV-2 RNA is generally  detectable in upper and lower respiratory sp ecimens during the acute  phase of infection. The expected result is Negative. Fact Sheet for Patients:  StrictlyIdeas.no Fact Sheet for Healthcare  Providers: BankingDealers.co.za This test is not yet approved or cleared by the [place] FDA and has been authorized for detection and/or diagnosis of SARS-CoV-2 by FDA under an Emergency Use Authorization (EUA).  This EUA will remain in effect (meaning this test can be used) for the duration of the COVID-19 declaration under Section 564(b)(1) of the Act, 21 U.S.C. section 360bbb-3(b)(1), unless the authorization is terminated or revoked sooner. Performed at [facility], [address], [place], [place] [address]   Prepare RBC     Status: None   Collection Time: 04/24/19  3:24 PM  Result Value Ref Range   Order Confirmation      ORDER PROCESSED BY BLOOD BANK Performed at [facility], [address] [place], [place] [address]   CBC with Differential     Status: Abnormal   Collection Time: 04/24/19  3:24 PM  Result Value Ref Range   WBC 9.1 4.0 - 10.5 K/uL   RBC 1.50 (L) 4.22 - 5.81 MIL/uL   Hemoglobin 4.8 (LL) 13.0 - 17.0 g/dL    Comment: REPEATED TO VERIFY THIS CRITICAL RESULT HAS VERIFIED AND BEEN CALLED TO L CARDWELL BY HILLARY FLYNT ON 07 14 2020 AT 1553,  AND HAS BEEN READ BACK.     HCT 15.0 (L) 39.0 - 52.0 %   MCV 100.0 80.0 - 100.0 fL   MCH 32.0 26.0 - 34.0 pg   MCHC 32.0 30.0 - 36.0 g/dL   RDW 13.8 11.5 - 15.5 %   Platelets 155 150 - 400 K/uL   nRBC 0.0 0.0 - 0.2 %   Neutrophils Relative % 72 %   Neutro Abs 6.4 1.7 - 7.7 K/uL   Lymphocytes Relative 19 %   Lymphs Abs 1.8 0.7 - 4.0 K/uL   Monocytes Relative 9 %   Monocytes Absolute 0.8 0.1 - 1.0 K/uL   Eosinophils Relative 0 %   Eosinophils Absolute 0.0 0.0 - 0.5 K/uL   Basophils Relative 0 %   Basophils Absolute 0.0 0.0 - 0.1 K/uL   Immature Granulocytes 0 %   Abs Immature Granulocytes 0.03 0.00 - 0.07 K/uL    Comment: Performed at [facility], [address], [place], [place] [address]  Basic metabolic panel     Status: Abnormal   Collection Time: 04/24/19  3:24 PM  Result Value Ref  Range   Sodium 137 135 - 145 mmol/L   Potassium 4.4 3.5 - 5.1 mmol/L   Chloride 107 98 - 111 mmol/L   CO2 23 22 - 32 mmol/L   Glucose, Bld 146 (H) 70 - 99 mg/dL   BUN 97 (H) 8 - 23 mg/dL   Creatinine, Ser 3.84 (H) 0.61 - 1.24 mg/dL   Calcium 8.0 (L) 8.9 - 10.3 mg/dL   GFR calc non Af Amer 14 (L) >60 mL/min   GFR calc Af Amer 16 (L) >60 mL/min   Anion gap 7 5 - 15    Comment: Performed at [facility], [address], [place], [place] [address]  Vitamin B12     Status: None   Collection Time: 04/24/19  3:24 PM  Result Value Ref Range   Vitamin B-12 244 180 - 914 pg/mL    Comment: (NOTE) This assay is not validated for testing neonatal or myeloproliferative syndrome specimens for Vitamin B12 levels. Performed at [facility], [address] [place], [place] [address]   Folate     Status: None   Collection Time: 04/24/19  3:24 PM  Result Value Ref Range   Folate 37.5 >5.9 ng/mL    Comment: RESULTS CONFIRMED BY MANUAL DILUTION Performed at [facility], [address] [place], [place] [address]   Iron and TIBC     Status: Abnormal   Collection Time: 04/24/19  3:24 PM  Result Value Ref Range   Iron 61 45 - 182 ug/dL   TIBC 245 (L) 250 - 450 ug/dL   Saturation Ratios 25 17.9 - 39.5 %   UIBC 184 ug/dL    Comment: Performed at [facility], [address] [place], [place] [address]  Ferritin     Status: None   Collection Time: 04/24/19  3:24 PM  Result Value Ref Range   Ferritin 158 24 - 336 ng/mL    Comment: Performed at [facility], [address], [place],  [address]  Reticulocytes     Status: Abnormal   Collection Time: 04/24/19  3:24 PM  Result Value Ref Range   Retic Ct Pct 4.8 (H) 0.4 - 3.1 %   RBC. 1.50 (L) 4.22 - 5.81 MIL/uL   Retic Count, Absolute 72.6 19.0 - 186.0 K/uL   Immature Retic Fract 15.5 2.3 - 15.9 %    Comment: Performed  at [facility], [address] [place], [place] [address]  Type and screen [facility]     Status: None (Preliminary  result)   Collection Time: 04/24/19  3:31 PM  Result Value Ref Range   ABO/RH(D) A POS    Antibody Screen NEG    Sample Expiration 04/27/2019,2359    Unit Number [id number]    Blood Component Type RED CELLS,LR    Unit division 00    Status of Unit ISSUED    Transfusion Status OK TO TRANSFUSE    Crossmatch Result Compatible    Unit Number [id number]    Blood Component Type RED CELLS,LR    Unit division 00    Status of Unit ISSUED    Transfusion Status OK TO TRANSFUSE    Crossmatch Result Compatible    Unit Number [id number]    Blood Component Type RED CELLS,LR    Unit division 00    Status of Unit ISSUED    Transfusion Status OK TO TRANSFUSE    Crossmatch Result      Compatible Performed at [facility], [address], [place], [place] [address]    Unit Number [id number]    Blood Component Type RED CELLS,LR    Unit division 00    Status of Unit ALLOCATED    Transfusion Status OK TO TRANSFUSE    Crossmatch Result Compatible   POC occult blood, ED     Status: Abnormal   Collection Time: 04/24/19  4:27 PM  Result Value Ref Range   Fecal Occult Bld POSITIVE (A) NEGATIVE  MRSA PCR Screening     Status: None   Collection Time: 04/24/19 11:26 PM   Specimen: Nasal Mucosa; Nasopharyngeal  Result Value Ref Range   MRSA by PCR NEGATIVE NEGATIVE    Comment:        The GeneXpert MRSA Assay (FDA approved for NASAL specimens only), is one component of a comprehensive MRSA colonization surveillance program. It is not intended to diagnose MRSA infection nor to guide or monitor treatment for MRSA infections. Performed at [facility], [address], [place], [place] [address]   Prepare RBC     Status: None   Collection Time: 04/24/19 11:50 PM  Result Value Ref Range   Order Confirmation      ORDER PROCESSED BY BLOOD BANK Performed at [facility], [address], [place], [place] [address]     Ct Abdomen Pelvis Wo Contrast  Result Date: 04/24/2019 CLINICAL  DATA:  Abdominal pain EXAM: CT ABDOMEN AND PELVIS WITHOUT CONTRAST TECHNIQUE: Multidetector CT imaging of the abdomen and pelvis was performed following the standard protocol without IV contrast. COMPARISON:  None. FINDINGS: Lower chest: Mild cardiomegaly. Coronary artery calcifications. Small bilateral pleural effusions with bibasilar atelectasis. Hepatobiliary: Small layering gallstone within the gallbladder. No focal hepatic abnormality. No biliary ductal dilatation. Pancreas: No focal abnormality or ductal dilatation. Spleen: No focal abnormality.  Normal size. Adrenals/Urinary Tract: Punctate nonobstructing stone in the lower pole of the left kidney. No hydronephrosis. No renal or adrenal mass. Urinary bladder unremarkable. Stomach/Bowel: Descending colonic and sigmoid diverticulosis. No active diverticulitis. Appendix normal. Stomach and small bowel decompressed, unremarkable. Vascular/Lymphatic: Aortic atherosclerosis. No enlarged abdominal or pelvic lymph nodes. Reproductive: No visible focal abnormality. Other: No free fluid or free air. Musculoskeletal: No acute bony abnormality. Bilateral L5 pars defects. Degenerative disc and facet 1 disease in the lumbar spine. IMPRESSION: Punctate left lower pole nephrolithiasis.  No hydronephrosis. Small bilateral effusions.  Bibasilar atelectasis. Small layering gallstone  within the gallbladder. Left colonic diverticulosis.  No active diverticulitis. Aortic atherosclerosis. Electronically Signed   By: Rolm Baptise M.D.   On: 04/24/2019 18:42   Dg Chest Portable 1 View  Result Date: 04/24/2019 CLINICAL DATA:  Anemia. EXAM: PORTABLE CHEST 1 VIEW COMPARISON:  None. FINDINGS: Mild cardiomegaly. Atherosclerosis of thoracic aorta is noted. Both lungs are clear. The visualized skeletal structures are unremarkable. IMPRESSION: No active disease. Electronically Signed   By: Marijo Conception M.D.   On: 04/24/2019 15:23    ROS  General: + fatigue, no weight loss, fever  or chills. Respiratory: uses Cpap, no current SOB. See HPI. Cardiac: recent chest wall pain after suffering from 2 falls, no current CP. No palpitations or dizziness. GI: See HPI. GU: CKD, no dysuria or hematuria, see HPI. Musculoskeletal: left hip and leg pain. Neuro: denies headaches, dizziness or numbness. Hematologic: + easy bruising  Blood pressure 130/70, pulse 65, temperature (!) 97.4 F (36.3 C), temperature source Oral, resp. rate 18, height 5\' 9"  (1.753 m), weight 90.7 kg, SpO2 97 %. Physical Exam  HEAD: normocephalic. Eyes: Sclera nonicteric. PERRLA. Mouth: no ulcers, dentition intact.  Heart: U9,W1, 2/6 pansystolic murmur. Lungs: Clear throughout lung fields. Abdomen: soft, nondistended, nontender, + BS x 4 quads. No HSM. Rectal: scant amount of melenic stool in the rectal vault, enlarge prostate.  Extremities: 1+ edema to the lower extremities.  Neuro: Alert and oriented x 3. Moves all extremities.   Assessment/Plan:  1.  84 y.o. male with acute on chronic anemia, upper GI bleed with melenic stool. Admission Hg 4.9. Base line Hg 7.8. BUN 97 (baseline BUN 76). Transfused 4 units of PRBCs, post transfusion H/H pending. -NPO after breakfast -await post transfusion H/H results -transfuse for Hg< 8 -EGD this afternoon with Dr. Laural Golden. EGD benefits and risks discussed including risks with sedation, risk of bleeding, perforation and infection. -continue Protonix 40mg  IV bid -request EGD/colonoscpy records from Dr. Earley Brooke  2. CKD stage IV on Procrit, patient previously declined access for dialysis  3. HTN, CAD   L  04/25/2019, 8:30 AM

## 2019-04-25 NOTE — Progress Notes (Signed)
Brief EGD note.  6 mm prepyloric ulcer with clean base. Mild antral gastritis. Rest of examination normal.

## 2019-04-25 NOTE — Progress Notes (Signed)
PROGRESS NOTE  Terry Graves. ZOX:[id number] DOB: Feb 10, 1935 DOA: 04/24/2019 PCP: [name], MD  Brief History:  84 year old male with a history of CKD stage V, hypertension, diabetes mellitus, hyperlipidemia presenting from his nephrologist office secondary to noted low hemoglobin on his blood work.  The patient is a difficult historian at best.  Most of this history is obtained from review of the medical record.  However, the patient does state that he has had some intermittent shortness of breath and dyspnea on exertion for the past week prior to admission.  He had denied any fevers, chills, chest pain, nausea, vomiting, diarrhea, abdominal pain.  He has had some lightheadedness.  He does complain of intermittent melanotic stools for the better part of the year.  He denies any recent NSAIDs.  He has had increasing gait instability requiring use of a walker.  However, he continues to have falls at home.  He was seen in the emergency department on 04/02/2019 at [facility].  X-rays of his left hip left ribs, and left femur were negative for any fractures.  Upon presentation to this hospital, the patient was noted with hemoglobin of 4.8.  FOBT was positive.  Serum creatinine was 3.4 which is near his baseline.  The patient was admitted for further evaluation.  GI was consulted to assist with management.  Assessment/Plan: Acute blood loss anemia -Suspect upper GI bleed -GI consult -Baseline hemoglobin~8 -Start Protonix 40 mg IV twice daily  Melena -GI consult -Protonix as discussed above  Anemia of CKD -Iron saturation 25%, ferritin 811 -Folic acid 91.4 -Serum B12 244  CKD stage V -Baseline creatinine 3.8 -Monitor clinically -Continue outpatient dose of torsemide 20 mg daily  Hyperlipidemia -Restart Lipitor and fenofibrate  Essential hypertension -Restart Hytrin   Stage 2 sacral ulcer -local wound care -present on admission    Disposition Plan:   Home  in 1-2   Family Communication:   No Family at bedside  Consultants:  GI  Code Status:  FULL   DVT Prophylaxis:  SCDs   Procedures: As Listed in Progress Note Above  Antibiotics: None     Subjective: Patient denies fevers, chills, headache, chest pain, dyspnea, nausea, vomiting, diarrhea, abdominal pain, dysuria, hematuria, hematochezia, and melena.   Objective: Vitals:   04/25/19 0400 04/25/19 0500 04/25/19 0600 04/25/19 0750  BP: 137/70 (!) 142/74 130/70   Pulse: 80 72 65   Resp: 19 18 18    Temp:   97.7 F (36.5 C) (!) 97.4 F (36.3 C)  TempSrc:   Oral Oral  SpO2: 94% 98% 97%   Weight:      Height:        Intake/Output Summary (Last 24 hours) at 04/25/2019 7829 Last data filed at 04/25/2019 0600 Gross per 24 hour  Intake 1098 ml  Output 200 ml  Net 898 ml   Weight change:  Exam:   General:  Pt is alert, follows commands appropriately, not in acute distress  HEENT: No icterus, No thrush, No neck mass, [place]/AT  Cardiovascular: RRR, S1/S2, no rubs, no gallops  Respiratory: CTA bilaterally, no wheezing, no crackles, no rhonchi  Abdomen: Soft/+BS, non tender, non distended, no guarding  Extremities: 1 + LE edema, No lymphangitis, No petechiae, No rashes, no synovitis   Data Reviewed: I have personally reviewed following labs and imaging studies Basic Metabolic Panel: Recent Labs  Lab 04/24/19 1524  NA 137  K 4.4  CL 107  CO2 23  GLUCOSE 146*  BUN 97*  CREATININE 3.84*  CALCIUM 8.0*   Liver Function Tests: No results for input(s): AST, ALT, ALKPHOS, BILITOT, PROT, ALBUMIN in the last 168 hours. No results for input(s): LIPASE, AMYLASE in the last 168 hours. No results for input(s): AMMONIA in the last 168 hours. Coagulation Profile: No results for input(s): INR, PROTIME in the last 168 hours. CBC: Recent Labs  Lab 04/24/19 1327 04/24/19 1328 04/24/19 1343 04/24/19 1524  WBC  --   --   --  9.1  NEUTROABS  --   --   --  6.4  HGB 4.8*  5.0* 4.9* 4.8*  HCT  --   --  15.0* 15.0*  MCV  --   --   --  100.0  PLT  --   --   --  155   Cardiac Enzymes: No results for input(s): CKTOTAL, CKMB, CKMBINDEX, TROPONINI in the last 168 hours. BNP: Invalid input(s): POCBNP CBG: No results for input(s): GLUCAP in the last 168 hours. HbA1C: No results for input(s): HGBA1C in the last 72 hours. Urine analysis: No results found for: COLORURINE, APPEARANCEUR, LABSPEC, PHURINE, GLUCOSEU, HGBUR, BILIRUBINUR, KETONESUR, PROTEINUR, UROBILINOGEN, NITRITE, LEUKOCYTESUR Sepsis Labs: @LABRCNTIP (procalcitonin:4,lacticidven:4) ) Recent Results (from the past 240 hour(s))  SARS Coronavirus 2 (CEPHEID - Performed in [place] hospital lab), Hosp Order     Status: None   Collection Time: 04/24/19  2:58 PM   Specimen: Nasopharyngeal Swab  Result Value Ref Range Status   SARS Coronavirus 2 NEGATIVE NEGATIVE Final    Comment: (NOTE) If result is NEGATIVE SARS-CoV-2 target nucleic acids are NOT DETECTED. The SARS-CoV-2 RNA is generally detectable in upper and lower  respiratory specimens during the acute phase of infection. The lowest  concentration of SARS-CoV-2 viral copies this assay can detect is 250  copies / mL. A negative result does not preclude SARS-CoV-2 infection  and should not be used as the sole basis for treatment or other  patient management decisions.  A negative result may occur with  improper specimen collection / handling, submission of specimen other  than nasopharyngeal swab, presence of viral mutation(s) within the  areas targeted by this assay, and inadequate number of viral copies  (<250 copies / mL). A negative result must be combined with clinical  observations, patient history, and epidemiological information. If result is POSITIVE SARS-CoV-2 target nucleic acids are DETECTED. The SARS-CoV-2 RNA is generally detectable in upper and lower  respiratory specimens dur ing the acute phase of infection.  Positive    results are indicative of active infection with SARS-CoV-2.  Clinical  correlation with patient history and other diagnostic information is  necessary to determine patient infection status.  Positive results do  not rule out bacterial infection or co-infection with other viruses. If result is PRESUMPTIVE POSTIVE SARS-CoV-2 nucleic acids MAY BE PRESENT.   A presumptive positive result was obtained on the submitted specimen  and confirmed on repeat testing.  While 2019 novel coronavirus  (SARS-CoV-2) nucleic acids may be present in the submitted sample  additional confirmatory testing may be necessary for epidemiological  and / or clinical management purposes  to differentiate between  SARS-CoV-2 and other Sarbecovirus currently known to infect humans.  If clinically indicated additional testing with an alternate test  methodology [phone]) is advised. The SARS-CoV-2 RNA is generally  detectable in upper and lower respiratory sp ecimens during the acute  phase of infection. The expected result is Negative. Fact Sheet for Patients:  StrictlyIdeas.no Fact Sheet for Healthcare Providers: BankingDealers.co.za This test is not yet approved or cleared by the [place] FDA and has been authorized for detection and/or diagnosis of SARS-CoV-2 by FDA under an Emergency Use Authorization (EUA).  This EUA will remain in effect (meaning this test can be used) for the duration of the COVID-19 declaration under Section 564(b)(1) of the Act, 21 U.S.C. section 360bbb-3(b)(1), unless the authorization is terminated or revoked sooner. Performed at [facility], [address], [place], [place] [address]   MRSA PCR Screening     Status: None   Collection Time: 04/24/19 11:26 PM   Specimen: Nasal Mucosa; Nasopharyngeal  Result Value Ref Range Status   MRSA by PCR NEGATIVE NEGATIVE Final    Comment:        The GeneXpert MRSA Assay (FDA approved for  NASAL specimens only), is one component of a comprehensive MRSA colonization surveillance program. It is not intended to diagnose MRSA infection nor to guide or monitor treatment for MRSA infections. Performed at [facility], [address], [place], [place] [address]      Scheduled Meds:  Chlorhexidine Gluconate Cloth  6 each Topical Daily   insulin aspart  0-15 Units Subcutaneous Q4H   pantoprazole (PROTONIX) IV  40 mg Intravenous Q12H   ropinirole  5 mg Oral QHS   sodium bicarbonate  650 mg Oral BID   umeclidinium bromide  1 puff Inhalation Daily   Continuous Infusions:  Procedures/Studies: Ct Abdomen Pelvis Wo Contrast  Result Date: 04/24/2019 CLINICAL DATA:  Abdominal pain EXAM: CT ABDOMEN AND PELVIS WITHOUT CONTRAST TECHNIQUE: Multidetector CT imaging of the abdomen and pelvis was performed following the standard protocol without IV contrast. COMPARISON:  None. FINDINGS: Lower chest: Mild cardiomegaly. Coronary artery calcifications. Small bilateral pleural effusions with bibasilar atelectasis. Hepatobiliary: Small layering gallstone within the gallbladder. No focal hepatic abnormality. No biliary ductal dilatation. Pancreas: No focal abnormality or ductal dilatation. Spleen: No focal abnormality.  Normal size. Adrenals/Urinary Tract: Punctate nonobstructing stone in the lower pole of the left kidney. No hydronephrosis. No renal or adrenal mass. Urinary bladder unremarkable. Stomach/Bowel: Descending colonic and sigmoid diverticulosis. No active diverticulitis. Appendix normal. Stomach and small bowel decompressed, unremarkable. Vascular/Lymphatic: Aortic atherosclerosis. No enlarged abdominal or pelvic lymph nodes. Reproductive: No visible focal abnormality. Other: No free fluid or free air. Musculoskeletal: No acute bony abnormality. Bilateral L5 pars defects. Degenerative disc and facet 1 disease in the lumbar spine. IMPRESSION: Punctate left lower pole nephrolithiasis.  No  hydronephrosis. Small bilateral effusions.  Bibasilar atelectasis. Small layering gallstone within the gallbladder. Left colonic diverticulosis.  No active diverticulitis. Aortic atherosclerosis. Electronically Signed   By: Rolm Baptise M.D.   On: 04/24/2019 18:42   Dg Chest Portable 1 View  Result Date: 04/24/2019 CLINICAL DATA:  Anemia. EXAM: PORTABLE CHEST 1 VIEW COMPARISON:  None. FINDINGS: Mild cardiomegaly. Atherosclerosis of thoracic aorta is noted. Both lungs are clear. The visualized skeletal structures are unremarkable. IMPRESSION: No active disease. Electronically Signed   By: Marijo Conception M.D.   On: 04/24/2019 15:23    Orson Eva, DO  [facility] Pager [phone]  If 7PM-7AM, please contact night-coverage [email or web address] Password [facility] 04/25/2019, 8:37 AM   LOS: 1 day

## 2019-04-26 ENCOUNTER — Inpatient Hospital Stay (HOSPITAL_COMMUNITY): Payer: Medicare Other

## 2019-04-26 DIAGNOSIS — I361 Nonrheumatic tricuspid (valve) insufficiency: Secondary | ICD-10-CM

## 2019-04-26 DIAGNOSIS — E1122 Type 2 diabetes mellitus with diabetic chronic kidney disease: Secondary | ICD-10-CM

## 2019-04-26 DIAGNOSIS — I340 Nonrheumatic mitral (valve) insufficiency: Secondary | ICD-10-CM

## 2019-04-26 LAB — H. PYLORI ANTIBODY, IGG: H Pylori IgG: 0.7 Index Value (ref 0.00–0.79)

## 2019-04-26 LAB — ECHOCARDIOGRAM COMPLETE
Height: 69 in
Weight: 3135.82 oz

## 2019-04-26 LAB — CBC
HCT: 23.3 % — ABNORMAL LOW (ref 39.0–52.0)
Hemoglobin: 7.9 g/dL — ABNORMAL LOW (ref 13.0–17.0)
MCH: 31.3 pg (ref 26.0–34.0)
MCHC: 33.9 g/dL (ref 30.0–36.0)
MCV: 92.5 fL (ref 80.0–100.0)
Platelets: 151 10*3/uL (ref 150–400)
RBC: 2.52 MIL/uL — ABNORMAL LOW (ref 4.22–5.81)
RDW: 16.5 % — ABNORMAL HIGH (ref 11.5–15.5)
WBC: 7.6 10*3/uL (ref 4.0–10.5)
nRBC: 0 % (ref 0.0–0.2)

## 2019-04-26 LAB — BASIC METABOLIC PANEL
Anion gap: 9 (ref 5–15)
BUN: 85 mg/dL — ABNORMAL HIGH (ref 8–23)
CO2: 23 mmol/L (ref 22–32)
Calcium: 8.4 mg/dL — ABNORMAL LOW (ref 8.9–10.3)
Chloride: 108 mmol/L (ref 98–111)
Creatinine, Ser: 3.28 mg/dL — ABNORMAL HIGH (ref 0.61–1.24)
GFR calc Af Amer: 19 mL/min — ABNORMAL LOW (ref >60)
GFR calc non Af Amer: 17 mL/min — ABNORMAL LOW (ref >60)
Glucose, Bld: 89 mg/dL (ref 70–99)
Potassium: 4.4 mmol/L (ref 3.5–5.1)
Sodium: 140 mmol/L (ref 135–145)

## 2019-04-26 LAB — GLUCOSE, CAPILLARY
Glucose-Capillary: 126 mg/dL — ABNORMAL HIGH (ref 70–99)
Glucose-Capillary: 104 mg/dL — ABNORMAL HIGH (ref 70–99)
Glucose-Capillary: 157 mg/dL — ABNORMAL HIGH (ref 70–99)

## 2019-04-26 LAB — POCT HEMOGLOBIN-HEMACUE: Hemoglobin: 4.8 g/dL — CL (ref 13.0–17.0)

## 2019-04-26 MED ORDER — PANTOPRAZOLE SODIUM 40 MG PO TBEC: 40.0000 mg | DELAYED_RELEASE_TABLET | Freq: Every day | ORAL | Status: DC

## 2019-04-26 MED ORDER — PANTOPRAZOLE SODIUM 40 MG PO TBEC
40.0000 mg | DELAYED_RELEASE_TABLET | Freq: Two times a day (BID) | ORAL | Status: DC
  Administered 2019-04-26 – 2019-04-27 (×2): 40 mg via ORAL
  Filled 2019-04-26 (×2): qty 1

## 2019-04-26 NOTE — Evaluation (Signed)
Physical Therapy Evaluation Patient Details Name: Amitai Delaughter. MRN: [id number] DOB: 07-17-1935 Today's Date: 04/26/2019   History of Present Illness  Cal Gindlesperger. is a 84 y.o. 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  Patient also states that he has been feeling progressively weaker and dyspneic with exertion in the past 2 weeks.  He had an episode of melanotic stools this morning, but has been having on and off melena for the past 2 months.  He denies using large doses of aspirin or NSAIDs.  He has been frequently lightheaded with palpitations, occasionally "seeing stars "when he gets out of bed or stands up from a chair.  Due to the weakness, he has had 2 mechanical falls in the past 2 months.  He saw for left hip and multiple rib fractures and was seen at another facility.  He denies fever, chills, sore throat or rhinorrhea.  No productive cough, wheezing or hemoptysis.  Denies chest pain, but state he has frequent lower extremity edema.  No PND or orthopnea.  Denies abdominal pain, nausea or emesis.  No dysuria, frequency or hematuria.  No polyuria, polydipsia, polyphagia or blurred vision.    Clinical Impression  Patient demonstrates good return for moving LLE when sitting up at bedside, c/o mild dizziness once sitting that resolved after 2-3 minutes, no dizziness during sit to stand, limited ambulation in room due to c/o increasing left hip pain, had to take sitting rest break at bedside before walking around bed to sit in chair.  Patient required pillow on seat of chair to relieve sacral pain and tolerated sitting up in chair after therapy - RN notified.  Patient will benefit from continued physical therapy in hospital and recommended venue below to  increase strength, balance, endurance for safe ADLs and gait.    Follow Up Recommendations Home health PT;Supervision for mobility/OOB;Supervision/Assistance - 24 hour    Equipment Recommendations  None recommended by PT    Recommendations for Other Services       Precautions / Restrictions Precautions Precautions: Fall Restrictions Weight Bearing Restrictions: No      Mobility  Bed Mobility Overal bed mobility: Needs Assistance Bed Mobility: Supine to Sit     Supine to sit: Supervision     General bed mobility comments: increased time and use of bed rail  Transfers Overall transfer level: Needs assistance Equipment used: Rolling walker (2 wheeled) Transfers: Sit to/from [facility] Sit to Stand: Min guard Stand pivot transfers: Min guard       General transfer comment: requires verbal/tactile cueing for proper hand placement using RW during sit to stands with fair carryover  Ambulation/Gait Ambulation/Gait assistance: Min guard;Min assist Gait Distance (Feet): 18 Feet Assistive device: Rolling walker (2 wheeled) Gait Pattern/deviations: Decreased step length - right;Decreased step length - left;Decreased stride length Gait velocity: decreased   General Gait Details: slow labored cadence without loss of balance, limited mostly due to c/o increasing pain left hip and unable to make it out of room, had sit and rest before walking back to chair  Stairs            Wheelchair Mobility    Modified Rankin (Stroke Patients Only)       Balance Overall balance assessment: Needs assistance Sitting-balance support: Feet  supported;No upper extremity supported Sitting balance-Leahy Scale: Fair Sitting balance - Comments: fair/good seated at bedside   Standing balance support: Bilateral upper extremity supported;During functional activity Standing balance-Leahy Scale: Fair Standing balance comment: using RW                              Pertinent Vitals/Pain Pain Assessment: Faces Faces Pain Scale: Hurts even more Pain Location: right hip when walking, over sacral area when sitting Pain Descriptors / Indicators: Aching;Sore Pain Intervention(s): Limited activity within patient's tolerance;Monitored during session;Repositioned(put pillow in patient seat for sacral discomfort)    Home Living Family/patient expects to be discharged to:: Private residence Living Arrangements: Children Available Help at Discharge: Family;Available 24 hours/day Type of Home: House Home Access: Ramped entrance;Stairs to enter   Entrance Stairs-Number of Steps: has 2 steps from driveway without siderails, uses ramp to get into house Home Layout: One level Home Equipment: Toilet riser;Bedside commode;Wheelchair - [employer] - 2 wheels;Walker - 4 wheels      Prior Function Level of Independence: Needs assistance   Gait / Transfers Assistance Needed: since fall household ambulator with RW, uses wheelchair for longer distances  ADL's / Homemaking Assistance Needed: assisted by family        Hand Dominance        Extremity/Trunk Assessment   Upper Extremity Assessment Upper Extremity Assessment: Generalized weakness    Lower Extremity Assessment Lower Extremity Assessment: Generalized weakness;RLE deficits/detail;LLE deficits/detail RLE Deficits / Details: grossly -5/5 LLE Deficits / Details: grossly -4/5    Cervical / Trunk Assessment Cervical / Trunk Assessment: Normal  Communication   Communication: No difficulties  Cognition Arousal/Alertness: Awake/alert Behavior During Therapy: WFL for tasks assessed/performed Overall Cognitive Status: Within Functional Limits for tasks assessed                                        General Comments      Exercises     Assessment/Plan    PT Assessment Patient needs continued PT services  PT Problem List Decreased strength;Decreased activity  tolerance;Decreased balance;Decreased mobility       PT Treatment Interventions Gait training;Stair training;Functional mobility training;Therapeutic activities;Therapeutic exercise;Patient/family education    PT Goals (Current goals can be found in the Care Plan section)  Acute Rehab PT Goals Patient Stated Goal: return home with family to assist PT Goal Formulation: With patient Time For Goal Achievement: 05/03/19 Potential to Achieve Goals: Good    Frequency Min 3X/week   Barriers to discharge        Co-evaluation               AM-PAC PT "6 Clicks" Mobility  Outcome Measure Help needed turning from your back to your side while in a flat bed without using bedrails?: None Help needed moving from lying on your back to sitting on the side of a flat bed without using bedrails?: None Help needed moving to and from a bed to a chair (including a wheelchair)?: A Little Help needed standing up from a chair using your arms (e.g., wheelchair or bedside chair)?: A Little Help needed to walk in hospital room?: A Lot Help needed climbing 3-5 steps with a railing? : A Lot 6 Click Score: 18    End of Session   Activity Tolerance: Patient tolerated treatment well;Patient limited by fatigue;No increased pain Patient left:  in chair;with call bell/phone within reach Nurse Communication: Mobility status PT Visit Diagnosis: Unsteadiness on feet (R26.81);Other abnormalities of gait and mobility (R26.89);Muscle weakness (generalized) (M62.81)    Time: 1010-1038 PT Time Calculation (min) (ACUTE ONLY): 28 min   Charges:   PT Evaluation $PT Eval Moderate Complexity: 1 Mod PT Treatments $Therapeutic Activity: 23-37 mins        2:29 PM, 04/26/19 Lonell Grandchild, MPT Physical Therapist with [facility] 336 [phone] office [phone] mobile phone

## 2019-04-26 NOTE — Progress Notes (Signed)
Patient's family called [facility] and asked them to fax over some of patient's information from his encounter with them on 7/6 d/t recent fall with MRI results. Patient does have follow-up appointment to have an outpatient MRI on Monday 7/20. Faxed paperwork is in patient's chart. Patient states he just wants to go home. Physical therapy was in to see patient. Only complaint from patient is that he is hurting on his sacrum and L hip/leg. Patient has pain medication PRN q4h that has been given.

## 2019-04-26 NOTE — Progress Notes (Signed)
Echocardiogram 2D Echocardiogram has been performed.  Terry Graves Circle 04/26/2019, 4:48 PM

## 2019-04-26 NOTE — Progress Notes (Signed)
  Subjective:  Patient says he does not have good appetite but he also does not like the hospital food.  He complains of left hip pain.  He denies abdominal pain nausea vomiting heartburn or chest pain.  He has been passing flatus but has not had a bowel movement today.  Objective: Blood pressure (!) 157/143, pulse 81, temperature 98.2 F (36.8 C), temperature source Oral, resp. rate 17, height 5' 9" (1.753 m), weight 88.9 kg, SpO2 98 %. Patient is alert and appears to be comfortable. He is watching TV. Abdomen is soft and nontender.  Labs/studies Results:  CBC Latest Ref Rng & Units 04/26/2019 04/25/2019 04/24/2019  WBC 4.0 - 10.5 K/uL 7.6 8.3 9.1  Hemoglobin 13.0 - 17.0 g/dL 7.9(L) 8.0(L) 4.8(LL)  Hematocrit 39.0 - 52.0 % 23.3(L) 24.4(L) 15.0(L)  Platelets 150 - 400 K/uL 151 146(L) 155    CMP Latest Ref Rng & Units 04/26/2019 04/25/2019 04/24/2019  Glucose 70 - 99 mg/dL 89 102(H) 146(H)  BUN 8 - 23 mg/dL 85(H) 94(H) 97(H)  Creatinine 0.61 - 1.24 mg/dL 3.28(H) 3.43(H) 3.84(H)  Sodium 135 - 145 mmol/L 140 138 137  Potassium 3.5 - 5.1 mmol/L 4.4 4.6 4.4  Chloride 98 - 111 mmol/L 108 105 107  CO2 22 - 32 mmol/L _0 Calcium 8.9 - 10.3 mg/dL 8.4(L) 8.5(L) 8.0(L)  Total Protein 6.5 - 8.1 g/dL - 5.4(L) -  Total Bilirubin 0.3 - 1.2 mg/dL - 0.9 -  Alkaline Phos 38 - 126 U/L - 69 -  AST 15 - 41 U/L - 17 -  ALT 0 - 44 U/L - 18 -    Hepatic Function Latest Ref Rng & Units 04/25/2019 01/23/2019  Total Protein 6.5 - 8.1 g/dL 5.4(L) -  Albumin 3.5 - 5.0 g/dL 2.6(L) 3.4(L)  AST 15 - 41 U/L 17 -  ALT 0 - 44 U/L 18 -  Alk Phosphatase 38 - 126 U/L 69 -  Total Bilirubin 0.3 - 1.2 mg/dL 0.9 -    H. pylori serology is negative.  Assessment:  #1.  Acute on chronic anemia secondary to slow or intermittent GI bleed.  Patient has received 3 units of PRBCs.  Hemoglobin has remained stable over the last 24 hours.  He presented with progressive weakness and exertional dyspnea and found to have  hemoglobin of 4.8 g.  Stool was guaiac positive.  EGD yesterday revealed gastric/prepyloric ulcer with clean base.  This was nevertheless felt to be source of GI blood loss.  He certainly could have additional lesions in small bowel.  No further work-up unless bleeding recurs.  #2. Peptic ulcer disease.  H. pylori serology is negative.  Suspect ulcer disease secondary to full dose aspirin use.  #3. Chronic kidney disease.  Renal function has improved somewhat.  #4. SVT.  Echo pending.  Recommendations  Change pantoprazole to oral route at a dose of 40 mg p.o. twice daily which should be continued for 8 weeks and thereafter once daily. Consider resuming aspirin at a low dose.  It can be resumed on 04/30/2019. Patient should refrain from using other NSAIDs. Will sign off.

## 2019-04-26 NOTE — Progress Notes (Addendum)
PROGRESS NOTE  Terry Graves. MWU:[id number] DOB: 1935-08-01 DOA: 04/24/2019 PCP: [name], MD  Brief History:  84 year old male with a history of CKD stage V, hypertension, diabetes mellitus, hyperlipidemia presenting from his nephrologist office secondary to noted low hemoglobin on his blood work.  The patient is a difficult historian at best.  Most of this history is obtained from review of the medical record.  However, the patient does state that he has had some intermittent shortness of breath and dyspnea on exertion for the past week prior to admission.  He had denied any fevers, chills, chest pain, nausea, vomiting, diarrhea, abdominal pain.  He has had some lightheadedness.  He does complain of intermittent melanotic stools for the better part of the year.  He denies any recent NSAIDs.  He has had increasing gait instability requiring use of a walker.  However, he continues to have falls at home.  He was seen in the emergency department on 04/02/2019 at [facility].  X-rays of his left hip left ribs, and left femur were negative for any fractures.  Upon presentation to this hospital, the patient was noted with hemoglobin of 4.8.  FOBT was positive.  Serum creatinine was 3.4 which is near his baseline.  The patient was admitted for further evaluation.  GI was consulted to assist with management. Patient also relates mechanical fall in mid June and on 04/10/19.  CT of pelvis at [facility] showed "questionable nondisplaced impacted fracture involoving the superior base the left femoral neck".  However, the patient has continued to ambulate with walker at home with mild pain.  Assessment/Plan: Acute blood loss anemia -Suspect upper GI bleed -GI consult appreciated -transfused 3 units PRBC -Baseline hemoglobin~8 -Start Protonix 40 mg IV twice daily  Melena -GI consult appreciated -04/25/19 EGD--gastritis, nonbleeding gastric ulcer -Protonix as discussed above -advance  diet  Left hip pain -questionable fracture -case discussed with Ortho, Dr. Warren Danes plain xray of L hip -PT-->HHPT -records from [place] reviewed  Anemia of CKD -Iron saturation 25%, ferritin 725 -Folic acid 36.6 -Serum B12 244  CKD stage V -Baseline creatinine 3.8 -Monitor clinically -Continue outpatient dose of torsemide 20 mg daily and bicarb  Hyperlipidemia -Restart Lipitor and fenofibrate  Essential hypertension -Restart Hytrin   Stage 2 sacral ulcer -local wound care -present on admission  SVT -personally reviewed EKG--SVT ?atrial tachycardia -start metoprolol -echo -am TSH    Disposition Plan:   Home when cleared by GI/Ortho  Family Communication:   daughter updated 7/16 on phone  Consultants:  GI  Code Status:  FULL   DVT Prophylaxis:  SCDs   Procedures: As Listed in Progress Note Above  Antibiotics: None   Total time spent 35 minutes.  Greater than 50% spent face to face counseling and coordinating care.    Subjective: Pt ambulated with PT today with only mild left hip pain.  Denies f/c, cp, sob, dizziness, n/v/d, abd pain.  Objective: Vitals:   04/26/19 0400 04/26/19 0500 04/26/19 0900 04/26/19 1200  BP: (!) 142/71  (!) 157/143   Pulse: 82  (!) 106 81  Resp: 19  19 17   Temp: 98.2 F (36.8 C)     TempSrc: Oral     SpO2: 99%  100% 98%  Weight:  88.9 kg    Height:        Intake/Output Summary (Last 24 hours) at 04/26/2019 1511 Last data filed at 04/26/2019 0300 Gross per 24 hour  Intake 354 ml  Output 900 ml  Net -546 ml   Weight change: -1.819 kg Exam:   General:  Pt is alert, follows commands appropriately, not in acute distress  HEENT: No icterus, No thrush, No neck mass, [place]/AT  Cardiovascular: RRR, S1/S2, no rubs, no gallops  Respiratory: CTA bilaterally, no wheezing, no crackles, no rhonchi  Abdomen: Soft/+BS, non tender, non distended, no guarding  Extremities: 1 + LE edema, No  lymphangitis, No petechiae, No rashes, no synovitis   Data Reviewed: I have personally reviewed following labs and imaging studies Basic Metabolic Panel: Recent Labs  Lab 04/24/19 1524 04/25/19 0849 04/26/19 0543  NA 137 138 140  K 4.4 4.6 4.4  CL 107 105 108  CO2 23 24 23   GLUCOSE 146* 102* 89  BUN 97* 94* 85*  CREATININE 3.84* 3.43* 3.28*  CALCIUM 8.0* 8.5* 8.4*   Liver Function Tests: Recent Labs  Lab 04/25/19 0849  AST 17  ALT 18  ALKPHOS 69  BILITOT 0.9  PROT 5.4*  ALBUMIN 2.6*   No results for input(s): LIPASE, AMYLASE in the last 168 hours. No results for input(s): AMMONIA in the last 168 hours. Coagulation Profile: No results for input(s): INR, PROTIME in the last 168 hours. CBC: Recent Labs  Lab 04/24/19 1328 04/24/19 1343 04/24/19 1524 04/25/19 0849 04/26/19 0543  WBC  --   --  9.1 8.3 7.6  NEUTROABS  --   --  6.4 6.1  --   HGB 5.0* 4.9* 4.8* 8.0* 7.9*  HCT  --  15.0* 15.0* 24.4* 23.3*  MCV  --   --  100.0 92.1 92.5  PLT  --   --  155 146* 151   Cardiac Enzymes: No results for input(s): CKTOTAL, CKMB, CKMBINDEX, TROPONINI in the last 168 hours. BNP: Invalid input(s): POCBNP CBG: Recent Labs  Lab 04/25/19 1142 04/25/19 1615 04/25/19 1940 04/25/19 2113 04/26/19 1118  GLUCAP 94 97 107* 99 126*   HbA1C: No results for input(s): HGBA1C in the last 72 hours. Urine analysis: No results found for: COLORURINE, APPEARANCEUR, LABSPEC, PHURINE, GLUCOSEU, HGBUR, BILIRUBINUR, KETONESUR, PROTEINUR, UROBILINOGEN, NITRITE, LEUKOCYTESUR Sepsis Labs: @LABRCNTIP (procalcitonin:4,lacticidven:4) ) Recent Results (from the past 240 hour(s))  SARS Coronavirus 2 (CEPHEID - Performed in [facility] hospital lab), Hosp Order     Status: None   Collection Time: 04/24/19  2:58 PM   Specimen: Nasopharyngeal Swab  Result Value Ref Range Status   SARS Coronavirus 2 NEGATIVE NEGATIVE Final    Comment: (NOTE) If result is NEGATIVE SARS-CoV-2 target nucleic acids  are NOT DETECTED. The SARS-CoV-2 RNA is generally detectable in upper and lower  respiratory specimens during the acute phase of infection. The lowest  concentration of SARS-CoV-2 viral copies this assay can detect is 250  copies / mL. A negative result does not preclude SARS-CoV-2 infection  and should not be used as the sole basis for treatment or other  patient management decisions.  A negative result may occur with  improper specimen collection / handling, submission of specimen other  than nasopharyngeal swab, presence of viral mutation(s) within the  areas targeted by this assay, and inadequate number of viral copies  (<250 copies / mL). A negative result must be combined with clinical  observations, patient history, and epidemiological information. If result is POSITIVE SARS-CoV-2 target nucleic acids are DETECTED. The SARS-CoV-2 RNA is generally detectable in upper and lower  respiratory specimens dur ing the acute phase of infection.  Positive  results are indicative of  active infection with SARS-CoV-2.  Clinical  correlation with patient history and other diagnostic information is  necessary to determine patient infection status.  Positive results do  not rule out bacterial infection or co-infection with other viruses. If result is PRESUMPTIVE POSTIVE SARS-CoV-2 nucleic acids MAY BE PRESENT.   A presumptive positive result was obtained on the submitted specimen  and confirmed on repeat testing.  While 2019 novel coronavirus  (SARS-CoV-2) nucleic acids may be present in the submitted sample  additional confirmatory testing may be necessary for epidemiological  and / or clinical management purposes  to differentiate between  SARS-CoV-2 and other Sarbecovirus currently known to infect humans.  If clinically indicated additional testing with an alternate test  methodology [phone]) is advised. The SARS-CoV-2 RNA is generally  detectable in upper and lower respiratory sp ecimens  during the acute  phase of infection. The expected result is Negative. Fact Sheet for Patients:  StrictlyIdeas.no Fact Sheet for Healthcare Providers: BankingDealers.co.za This test is not yet approved or cleared by the [place] FDA and has been authorized for detection and/or diagnosis of SARS-CoV-2 by FDA under an Emergency Use Authorization (EUA).  This EUA will remain in effect (meaning this test can be used) for the duration of the COVID-19 declaration under Section 564(b)(1) of the Act, 21 U.S.C. section 360bbb-3(b)(1), unless the authorization is terminated or revoked sooner. Performed at [facility], [address], [place], [place] [address]   MRSA PCR Screening     Status: None   Collection Time: 04/24/19 11:26 PM   Specimen: Nasal Mucosa; Nasopharyngeal  Result Value Ref Range Status   MRSA by PCR NEGATIVE NEGATIVE Final    Comment:        The GeneXpert MRSA Assay (FDA approved for NASAL specimens only), is one component of a comprehensive MRSA colonization surveillance program. It is not intended to diagnose MRSA infection nor to guide or monitor treatment for MRSA infections. Performed at [facility], [address], [place], [place] [address]      Scheduled Meds:  atorvastatin  40 mg Oral q1800   Chlorhexidine Gluconate Cloth  6 each Topical Daily   fenofibrate  160 mg Oral Daily   insulin aspart  0-15 Units Subcutaneous TID AC & HS   pantoprazole (PROTONIX) IV  40 mg Intravenous Q12H   ropinirole  5 mg Oral QHS   sodium bicarbonate  650 mg Oral BID   terazosin  1 mg Oral QHS   torsemide  20 mg Oral Daily   umeclidinium bromide  1 puff Inhalation Daily   Continuous Infusions:  Procedures/Studies: Ct Abdomen Pelvis Wo Contrast  Result Date: 04/24/2019 CLINICAL DATA:  Abdominal pain EXAM: CT ABDOMEN AND PELVIS WITHOUT CONTRAST TECHNIQUE: Multidetector CT imaging of the abdomen and pelvis was  performed following the standard protocol without IV contrast. COMPARISON:  None. FINDINGS: Lower chest: Mild cardiomegaly. Coronary artery calcifications. Small bilateral pleural effusions with bibasilar atelectasis. Hepatobiliary: Small layering gallstone within the gallbladder. No focal hepatic abnormality. No biliary ductal dilatation. Pancreas: No focal abnormality or ductal dilatation. Spleen: No focal abnormality.  Normal size. Adrenals/Urinary Tract: Punctate nonobstructing stone in the lower pole of the left kidney. No hydronephrosis. No renal or adrenal mass. Urinary bladder unremarkable. Stomach/Bowel: Descending colonic and sigmoid diverticulosis. No active diverticulitis. Appendix normal. Stomach and small bowel decompressed, unremarkable. Vascular/Lymphatic: Aortic atherosclerosis. No enlarged abdominal or pelvic lymph nodes. Reproductive: No visible focal abnormality. Other: No free fluid or free air. Musculoskeletal: No acute bony abnormality.  Bilateral L5 pars defects. Degenerative disc and facet 1 disease in the lumbar spine. IMPRESSION: Punctate left lower pole nephrolithiasis.  No hydronephrosis. Small bilateral effusions.  Bibasilar atelectasis. Small layering gallstone within the gallbladder. Left colonic diverticulosis.  No active diverticulitis. Aortic atherosclerosis. Electronically Signed   By: Rolm Baptise M.D.   On: 04/24/2019 18:42   Dg Chest Portable 1 View  Result Date: 04/24/2019 CLINICAL DATA:  Anemia. EXAM: PORTABLE CHEST 1 VIEW COMPARISON:  None. FINDINGS: Mild cardiomegaly. Atherosclerosis of thoracic aorta is noted. Both lungs are clear. The visualized skeletal structures are unremarkable. IMPRESSION: No active disease. Electronically Signed   By: Marijo Conception M.D.   On: 04/24/2019 15:23    Orson Eva, DO  [facility] Pager [phone]  If 7PM-7AM, please contact night-coverage [email or web address] Password [email or web address] 04/26/2019, 3:11 PM   LOS: 2 days

## 2019-04-26 NOTE — Plan of Care (Signed)
  Problem: Acute Rehab PT Goals(only PT should resolve) Goal: Pt Will Go Supine/Side To Sit Outcome: Progressing Flowsheets (Taken 04/26/2019 1433) Pt will go Supine/Side to Sit: with modified independence Goal: Patient Will Transfer Sit To/From Stand Outcome: Progressing Flowsheets (Taken 04/26/2019 1433) Patient will transfer sit to/from stand: with supervision Goal: Pt Will Transfer Bed To Chair/Chair To Bed Outcome: Progressing Flowsheets (Taken 04/26/2019 1433) Pt will Transfer Bed to Chair/Chair to Bed: with supervision Goal: Pt Will Ambulate Outcome: Progressing Flowsheets (Taken 04/26/2019 1433) Pt will Ambulate:  50 feet  with min guard assist  with rolling walker   2:33 PM, 04/26/19 Lonell Grandchild, MPT Physical Therapist with [facility] [phone] office [phone] mobile phone

## 2019-04-27 ENCOUNTER — Encounter (HOSPITAL_COMMUNITY): Payer: Self-pay | Admitting: Internal Medicine

## 2019-04-27 LAB — CBC
HCT: 23.3 % — ABNORMAL LOW (ref 39.0–52.0)
Hemoglobin: 7.6 g/dL — ABNORMAL LOW (ref 13.0–17.0)
MCH: 30.6 pg (ref 26.0–34.0)
MCHC: 32.6 g/dL (ref 30.0–36.0)
MCV: 94 fL (ref 80.0–100.0)
Platelets: 143 10*3/uL — ABNORMAL LOW (ref 150–400)
RBC: 2.48 MIL/uL — ABNORMAL LOW (ref 4.22–5.81)
RDW: 15.8 % — ABNORMAL HIGH (ref 11.5–15.5)
WBC: 5.9 10*3/uL (ref 4.0–10.5)
nRBC: 0 % (ref 0.0–0.2)

## 2019-04-27 LAB — TSH: TSH: 2.39 u[IU]/mL (ref 0.350–4.500)

## 2019-04-27 LAB — HEMOGLOBIN A1C
Hgb A1c MFr Bld: 5.2 % (ref 4.8–5.6)
Mean Plasma Glucose: 102.54 mg/dL

## 2019-04-27 LAB — T4, FREE: Free T4: 1.04 ng/dL (ref 0.61–1.12)

## 2019-04-27 LAB — BASIC METABOLIC PANEL
Anion gap: 7 (ref 5–15)
BUN: 75 mg/dL — ABNORMAL HIGH (ref 8–23)
CO2: 25 mmol/L (ref 22–32)
Calcium: 8.1 mg/dL — ABNORMAL LOW (ref 8.9–10.3)
Chloride: 105 mmol/L (ref 98–111)
Creatinine, Ser: 3.15 mg/dL — ABNORMAL HIGH (ref 0.61–1.24)
GFR calc Af Amer: 20 mL/min — ABNORMAL LOW (ref >60)
GFR calc non Af Amer: 18 mL/min — ABNORMAL LOW (ref >60)
Glucose, Bld: 93 mg/dL (ref 70–99)
Potassium: 4 mmol/L (ref 3.5–5.1)
Sodium: 137 mmol/L (ref 135–145)

## 2019-04-27 LAB — GLUCOSE, CAPILLARY
Glucose-Capillary: 102 mg/dL — ABNORMAL HIGH (ref 70–99)
Glucose-Capillary: 103 mg/dL — ABNORMAL HIGH (ref 70–99)
Glucose-Capillary: 108 mg/dL — ABNORMAL HIGH (ref 70–99)
Glucose-Capillary: 94 mg/dL (ref 70–99)

## 2019-04-27 MED ORDER — ASPIRIN 81 MG PO CHEW: 81.0000 mg | CHEWABLE_TABLET | Freq: Every morning | ORAL | Status: DC

## 2019-04-27 MED ORDER — METOPROLOL TARTRATE 25 MG PO TABS
25.0000 mg | ORAL_TABLET | Freq: Two times a day (BID) | ORAL | Status: DC
  Administered 2019-04-27: 25 mg via ORAL
  Filled 2019-04-27: qty 1

## 2019-04-27 NOTE — Progress Notes (Signed)
Physical Therapy Treatment Patient Details Name: Terry Graves.  MRN: [id number] DOB: 1935/01/18 Today's Date: 04/27/2019    History of Present Illness Terry Graves. is a 84 y.o. 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  Patient also states that he has been feeling progressively weaker and dyspneic with exertion in the past 2 weeks.  He had an episode of melanotic stools this morning, but has been having on and off melena for the past 2 months.  He denies using large doses of aspirin or NSAIDs.  He has been frequently lightheaded with palpitations, occasionally "seeing stars "when he gets out of bed or stands up from a chair.  Due to the weakness, he has had 2 mechanical falls in the past 2 months.  He saw for left hip and multiple rib fractures and was seen at another facility.  He denies fever, chills, sore throat or rhinorrhea.  No productive cough, wheezing or hemoptysis.  Denies chest pain, but state he has frequent lower extremity edema.  No PND or orthopnea.  Denies abdominal pain, nausea or emesis.  No dysuria, frequency or hematuria.  No polyuria, polydipsia, polyphagia or blurred vision.    PT Comments    Patient agreeable for therapy and able to sit up at bedside with bed flat and without use of bed rail, demonstrates fair/good return for completing BLE ROM/strengthening exercises with verbal cues and demonstration.  Patient tolerated longer distance for ambulation without loss of balance, mostly limited due to left hip pain and tolerated sitting up in chair after therapy.  Patient will benefit from continued physical therapy in hospital and recommended venue below to increase strength, balance, endurance for safe ADLs and gait.   Follow Up  Recommendations  Home health PT;Supervision for mobility/OOB;Supervision/Assistance - 24 hour     Equipment Recommendations  None recommended by PT    Recommendations for Other Services       Precautions / Restrictions Precautions Precautions: Fall Restrictions Weight Bearing Restrictions: No    Mobility  Bed Mobility Overal bed mobility: Modified Independent             General bed mobility comments: increased time  Transfers Overall transfer level: Needs assistance Equipment used: Rolling walker (2 wheeled) Transfers: Sit to/from [facility] Sit to Stand: Supervision Stand pivot transfers: Min guard       General transfer comment: good return for using BUE to push off bedside and armrests of chair to complete sit to stands  Ambulation/Gait Ambulation/Gait assistance: Min guard Gait Distance (Feet): 30 Feet Assistive device: Rolling walker (2 wheeled) Gait Pattern/deviations: Decreased step length - right;Decreased step length - left;Decreased stride length Gait velocity: decreased   General Gait Details: increased endurance/distance for ambulation with slightly labord cadence with fair return for left heel to toe stepping, mostly limited due to c/o increasing left hip pain and fatigue   Stairs             Wheelchair Mobility    Modified Rankin (Stroke Patients Only)       Balance Overall balance assessment: Needs assistance Sitting-balance support: Feet supported;No upper extremity supported Sitting balance-Leahy Scale: Good     Standing balance support: During functional activity;Bilateral upper extremity supported Standing balance-Leahy Scale: Fair Standing balance comment: using RW  Cognition Arousal/Alertness: Awake/alert Behavior During Therapy: WFL for tasks assessed/performed Overall Cognitive Status: Within Functional Limits for tasks assessed                                         Exercises General Exercises - Lower Extremity Long Arc Quad: Seated;AAROM;Strengthening;Both;10 reps Hip Flexion/Marching: Seated;AAROM;Strengthening;Both;10 reps Toe Raises: Seated;AAROM;Strengthening;Both;10 reps Heel Raises: Seated;AAROM;Strengthening;10 reps;Both    General Comments        Pertinent Vitals/Pain Pain Assessment: 0-10 Pain Score: 8  Pain Location: right hip Pain Descriptors / Indicators: Sore Pain Intervention(s): Limited activity within patient's tolerance;Monitored during session;RN gave pain meds during session    Home Living                      Prior Function            PT Goals (current goals can now be found in the care plan section) Acute Rehab PT Goals Patient Stated Goal: return home with family to assist PT Goal Formulation: With patient Time For Goal Achievement: 05/03/19 Potential to Achieve Goals: Good Progress towards PT goals: Progressing toward goals    Frequency    Min 3X/week      PT Plan Current plan remains appropriate    Co-evaluation              AM-PAC PT "6 Clicks" Mobility   Outcome Measure  Help needed turning from your back to your side while in a flat bed without using bedrails?: None Help needed moving from lying on your back to sitting on the side of a flat bed without using bedrails?: None Help needed moving to and from a bed to a chair (including a wheelchair)?: A Little Help needed standing up from a chair using your arms (e.g., wheelchair or bedside chair)?: A Little Help needed to walk in hospital room?: A Little Help needed climbing 3-5 steps with a railing? : A Lot 6 Click Score: 19    End of Session   Activity Tolerance: Patient tolerated treatment well;Patient limited by fatigue;Patient limited by pain Patient left: in chair;with call bell/phone within reach Nurse Communication: Mobility status PT Visit Diagnosis: Unsteadiness on feet (R26.81);Other  abnormalities of gait and mobility (R26.89);Muscle weakness (generalized) (M62.81)     Time: 9728-2060 PT Time Calculation (min) (ACUTE ONLY): 23 min  Charges:  $Therapeutic Exercise: 8-22 mins $Therapeutic Activity: 8-22 mins                     2:33 PM, 04/27/19 Lonell Grandchild, MPT Physical Therapist with [facility] [phone] office [phone] mobile phone

## 2019-04-27 NOTE — TOC Transition Note (Signed)
Transition of Care [facility]) - CM/SW Discharge Note   Patient Details  Name: Terry Graves. MRN: [id number] Date of Birth: 1935/09/22  [facility] CM/SW Contact:  Ihor Gully, LCSW Phone Number: 04/27/2019, 12:45 PM   Clinical Narrative:    Confirmed with daughter, Vaughan Basta, patient's [facility] choice. Faxed referral to choice. LCSW signing off.    Final next level of care: [place] Barriers to Discharge: No Barriers Identified   Patient Goals and CMS Choice Patient states their goals for this hospitalization and ongoing recovery are:: To return home. Daughter feels he would be more comfortable in the home. CMS Medicare.gov Compare Post Acute Care list provided to:: Patient Choice offered to / list presented to : Patient  Discharge Placement                       Discharge Plan and Services In-house Referral: Clinical Social Work   Post Acute Care Choice: [place] Agency: [place] Date [place]: 04/27/19 Time [place]: 1244 Representative spoke with at [facility]: [place] Determinants of Health ([place]) Interventions     Readmission Risk Interventions No flowsheet data found.

## 2019-04-27 NOTE — Discharge Summary (Signed)
Physician Discharge Summary  [name] [id number] DOB: 06-May-1935 DOA: 04/24/2019  PCP: [name], MD  Admit date: 04/24/2019 Discharge date: 04/27/2019  Admitted From: Home Disposition:  Home   Recommendations for Outpatient Follow-up:  1. Follow up with PCP in 1-2 weeks 2. Please obtain BMP/CBC in one week   Home Health: YES Equipment/Devices: HHPT  Discharge Condition: Stable CODE STATUS: FULL Diet recommendation: Heart Healthy   Brief/Interim Summary: 84 year old male with a history of CKD stage V, hypertension, diabetes mellitus, hyperlipidemia presenting from his nephrologist office secondary to noted low hemoglobin on his blood work. The patient is a difficult historian at best. Most of this history is obtained from review of the medical record.However, the patient does state that he has had some intermittent shortness of breath and dyspnea on exertion for the past week prior to admission. He had denied any fevers, chills, chest pain, nausea, vomiting, diarrhea, abdominal pain. He has had some lightheadedness. He does complain of intermittent melanotic stools for the better part of the year. He denies any recent NSAIDs. He has had increasing gait instability requiring use of a walker. However, he continues to have falls at home. He was seen in the emergency department on 04/02/2019 at [facility].X-rays of his left hip left ribs, and left femur were negative for any fractures. Upon presentation to this hospital, the patient was noted with hemoglobin of 4.8. FOBT was positive. Serum creatinine was 3.4 which is near his baseline. The patient was admitted for further evaluation. GI was consulted to assist with management. Patient also relates mechanical fall in mid June and on 04/10/19.  CT of pelvis at [facility] showed "questionable nondisplaced impacted fracture involoving the superior base the left femoral neck".  However, the patient has continued to  ambulate with walker at home with mild pain.  Discharge Diagnoses:  Acute blood loss anemia -Suspect upper GI bleed -GI consult appreciated -transfused 3 units PRBC -Baseline hemoglobin~8 -Start Protonix 40 mg IV twice daily>>d/c with po protonix x 8 weeks, then daily -Hgb 7.6 on day of d/c  Melena -GI consult appreciated -04/25/19 EGD--gastritis, nonbleeding gastric ulcer -Protonix as discussed above -advance diet-->tolerated  Left hip pain -questionable fracture previously seen on CT pelvis on 6/30 at [facility] -case discussed with Ortho, Dr. Warren Danes plain xray of L hip-->neg fracture; as pt has been bearing weight without minimal difficulty, pt does not need operative intervention at this time -follow up with outpt ortho, Dr. Collins [place] -PT-->HHPT -records from [place] reviewed  Anemia of CKD -Iron saturation 25%, ferritin 481 -Folic acid 85.6 -Serum B12 244  CKD stage V -Baseline creatinine 3.8 prior to admission -serum creatinine 3.15 on day of d/c -Monitor clinically -Continue outpatient dose of torsemide 20 mg daily and bicarb -follow up with outpt nephrology, [place]  Hyperlipidemia -Restart Lipitor and fenofibrate  Essential hypertension -Restart Hytrin -metoprolol added for SVT  Stage 2 sacral ulcer -local wound care -present on admission  SVT -personally reviewed EKG--SVT ?atrial tachycardia -restarted metoprolol that he was taking outpt -echo--EF 35-40%, mod-severe TR -am TSH--2.390    Discharge Instructions   Allergies as of 04/27/2019      Reactions   Penicillins Rash   Did it involve swelling of the face/tongue/throat, SOB, or low BP? No Did it involve sudden or severe rash/hives, skin peeling, or any reaction on the inside of your mouth or nose? Yes Did you need to seek medical attention at a hospital or doctor's office? Unknown When did it last  happen?60 years If all above answers are "NO", may proceed with cephalosporin  use.      Medication List    TAKE these medications   aspirin 81 MG chewable tablet Chew 1 tablet (81 mg total) by mouth every morning. Restart on 04/30/19 What changed: additional instructions   atorvastatin 40 MG tablet Commonly known as: LIPITOR Take 40 mg by mouth every evening.   Darbepoetin Alfa 60 MCG/0.3ML Sosy injection Commonly known as: ARANESP Inject 60 mcg into the skin every 14 (fourteen) days.   Docusate Sodium 100 MG capsule Take 100 mg by mouth 2 (two) times daily as needed for constipation.   fenofibrate 160 MG tablet Take 160 mg by mouth daily.   metoprolol tartrate 50 MG tablet Commonly known as: LOPRESSOR Take 50 mg by mouth daily.   nitroGLYCERIN 0.4 MG SL tablet Commonly known as: NITROSTAT Place 0.4 mg under the tongue every 5 (five) minutes as needed for chest pain.   pantoprazole 40 MG tablet Commonly known as: PROTONIX Take 40 mg by mouth every morning.   ropinirole 5 MG tablet Commonly known as: REQUIP Take 5 mg by mouth at bedtime.   sodium bicarbonate 650 MG tablet Take 1 tablet by mouth 2 (two) times a week.   terazosin 1 MG capsule Commonly known as: HYTRIN Take 1 mg by mouth at bedtime.   tiotropium 18 MCG inhalation capsule Commonly known as: SPIRIVA Place 18 mcg into inhaler and inhale daily.   torsemide 20 MG tablet Commonly known as: DEMADEX Take 20 mg by mouth daily.   Vitamin D (Ergocalciferol) 1.25 MG (50000 UT) Caps capsule Commonly known as: DRISDOL Take 1 capsule by mouth 2 (two) times a week.       Allergies  Allergen Reactions  . Penicillins Rash    Did it involve swelling of the face/tongue/throat, SOB, or low BP? No Did it involve sudden or severe rash/hives, skin peeling, or any reaction on the inside of your mouth or nose? Yes Did you need to seek medical attention at a hospital or doctor's office? Unknown When did it last happen?60 years If all above answers are "NO", may proceed with  cephalosporin use.    Consultations:  GI   Procedures/Studies: Ct Abdomen Pelvis Wo Contrast  Result Date: 04/24/2019 CLINICAL DATA:  Abdominal pain EXAM: CT ABDOMEN AND PELVIS WITHOUT CONTRAST TECHNIQUE: Multidetector CT imaging of the abdomen and pelvis was performed following the standard protocol without IV contrast. COMPARISON:  None. FINDINGS: Lower chest: Mild cardiomegaly. Coronary artery calcifications. Small bilateral pleural effusions with bibasilar atelectasis. Hepatobiliary: Small layering gallstone within the gallbladder. No focal hepatic abnormality. No biliary ductal dilatation. Pancreas: No focal abnormality or ductal dilatation. Spleen: No focal abnormality.  Normal size. Adrenals/Urinary Tract: Punctate nonobstructing stone in the lower pole of the left kidney. No hydronephrosis. No renal or adrenal mass. Urinary bladder unremarkable. Stomach/Bowel: Descending colonic and sigmoid diverticulosis. No active diverticulitis. Appendix normal. Stomach and small bowel decompressed, unremarkable. Vascular/Lymphatic: Aortic atherosclerosis. No enlarged abdominal or pelvic lymph nodes. Reproductive: No visible focal abnormality. Other: No free fluid or free air. Musculoskeletal: No acute bony abnormality. Bilateral L5 pars defects. Degenerative disc and facet 1 disease in the lumbar spine. IMPRESSION: Punctate left lower pole nephrolithiasis.  No hydronephrosis. Small bilateral effusions.  Bibasilar atelectasis. Small layering gallstone within the gallbladder. Left colonic diverticulosis.  No active diverticulitis. Aortic atherosclerosis. Electronically Signed   By: Rolm Baptise M.D.   On: 04/24/2019 18:42   Dg Chest Portable  1 View  Result Date: 04/24/2019 CLINICAL DATA:  Anemia. EXAM: PORTABLE CHEST 1 VIEW COMPARISON:  None. FINDINGS: Mild cardiomegaly. Atherosclerosis of thoracic aorta is noted. Both lungs are clear. The visualized skeletal structures are unremarkable. IMPRESSION: No  active disease. Electronically Signed   By: Marijo Conception M.D.   On: 04/24/2019 15:23   Dg Hip Unilat With Pelvis 2-3 Views Left  Result Date: 04/26/2019 CLINICAL DATA:  Left hip pain EXAM: DG HIP (WITH OR WITHOUT PELVIS) 2-3V LEFT COMPARISON:  None. 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 Diffuse osteopenia. No acute bony abnormality. Specifically, no fracture, subluxation, or dislocation. Diffuse vascular calcifications. IMPRESSION: Moderate degenerative changes in the hips bilaterally. No acute bony abnormality. Electronically Signed   By: Rolm Baptise M.D.   On: 04/26/2019 20:02        Discharge Exam: Vitals:   04/27/19 0811 04/27/19 0820  BP:    Pulse:  (!) 107  Resp:  18  Temp: 98.4 F (36.9 C)   SpO2:  98%   Vitals:   04/27/19 0600 04/27/19 0800 04/27/19 0811 04/27/19 0820  BP: 134/73 (!) 115/93    Pulse: 94 74  (!) 107  Resp: 17 (!) 6  18  Temp:   98.4 F (36.9 C)   TempSrc:   Oral   SpO2: 100% 92%  98%  Weight:      Height:        General: Pt is alert, awake, not in acute distress Cardiovascular: RRR, S1/S2 +, no rubs, no gallops Respiratory:bibasilar crackles, no wheeze Abdominal: Soft, NT, ND, bowel sounds + Extremities: trace LE edema, no cyanosis   The results of significant diagnostics from this hospitalization (including imaging, microbiology, ancillary and laboratory) are listed below for reference.    Significant Diagnostic Studies: Ct Abdomen Pelvis Wo Contrast  Result Date: 04/24/2019 CLINICAL DATA:  Abdominal pain EXAM: CT ABDOMEN AND PELVIS WITHOUT CONTRAST TECHNIQUE: Multidetector CT imaging of the abdomen and pelvis was performed following the standard protocol without IV contrast. COMPARISON:  None. FINDINGS: Lower chest: Mild cardiomegaly. Coronary artery calcifications. Small bilateral pleural effusions with bibasilar atelectasis. Hepatobiliary: Small layering gallstone within  the gallbladder. No focal hepatic abnormality. No biliary ductal dilatation. Pancreas: No focal abnormality or ductal dilatation. Spleen: No focal abnormality.  Normal size. Adrenals/Urinary Tract: Punctate nonobstructing stone in the lower pole of the left kidney. No hydronephrosis. No renal or adrenal mass. Urinary bladder unremarkable. Stomach/Bowel: Descending colonic and sigmoid diverticulosis. No active diverticulitis. Appendix normal. Stomach and small bowel decompressed, unremarkable. Vascular/Lymphatic: Aortic atherosclerosis. No enlarged abdominal or pelvic lymph nodes. Reproductive: No visible focal abnormality. Other: No free fluid or free air. Musculoskeletal: No acute bony abnormality. Bilateral L5 pars defects. Degenerative disc and facet 1 disease in the lumbar spine. IMPRESSION: Punctate left lower pole nephrolithiasis.  No hydronephrosis. Small bilateral effusions.  Bibasilar atelectasis. Small layering gallstone within the gallbladder. Left colonic diverticulosis.  No active diverticulitis. Aortic atherosclerosis. Electronically Signed   By: Rolm Baptise M.D.   On: 04/24/2019 18:42   Dg Chest Portable 1 View  Result Date: 04/24/2019 CLINICAL DATA:  Anemia. EXAM: PORTABLE CHEST 1 VIEW COMPARISON:  None. FINDINGS: Mild cardiomegaly. Atherosclerosis of thoracic aorta is noted. Both lungs are clear. The visualized skeletal structures are unremarkable. IMPRESSION: No active disease. Electronically Signed   By: Marijo Conception M.D.   On: 04/24/2019 15:23   Dg Hip Unilat With Pelvis 2-3 Views Left  Result  Date: 04/26/2019 CLINICAL DATA:  Left hip pain EXAM: DG HIP (WITH OR WITHOUT PELVIS) 2-3V LEFT COMPARISON:  None. 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 Diffuse osteopenia. No acute bony abnormality. Specifically, no fracture, subluxation, or dislocation. Diffuse vascular calcifications. IMPRESSION: Moderate degenerative changes  in the hips bilaterally. No acute bony abnormality. Electronically Signed   By: Rolm Baptise M.D.   On: 04/26/2019 20:02     Microbiology: Recent Results (from the past 240 hour(s))  SARS Coronavirus 2 (CEPHEID - Performed in [place] hospital lab), Hosp Order     Status: None   Collection Time: 04/24/19  2:58 PM   Specimen: Nasopharyngeal Swab  Result Value Ref Range Status   SARS Coronavirus 2 NEGATIVE NEGATIVE Final    Comment: (NOTE) If result is NEGATIVE SARS-CoV-2 target nucleic acids are NOT DETECTED. The SARS-CoV-2 RNA is generally detectable in upper and lower  respiratory specimens during the acute phase of infection. The lowest  concentration of SARS-CoV-2 viral copies this assay can detect is 250  copies / mL. A negative result does not preclude SARS-CoV-2 infection  and should not be used as the sole basis for treatment or other  patient management decisions.  A negative result may occur with  improper specimen collection / handling, submission of specimen other  than nasopharyngeal swab, presence of viral mutation(s) within the  areas targeted by this assay, and inadequate number of viral copies  (<250 copies / mL). A negative result must be combined with clinical  observations, patient history, and epidemiological information. If result is POSITIVE SARS-CoV-2 target nucleic acids are DETECTED. The SARS-CoV-2 RNA is generally detectable in upper and lower  respiratory specimens dur ing the acute phase of infection.  Positive  results are indicative of active infection with SARS-CoV-2.  Clinical  correlation with patient history and other diagnostic information is  necessary to determine patient infection status.  Positive results do  not rule out bacterial infection or co-infection with other viruses. If result is PRESUMPTIVE POSTIVE SARS-CoV-2 nucleic acids MAY BE PRESENT.   A presumptive positive result was obtained on the submitted specimen  and confirmed on  repeat testing.  While 2019 novel coronavirus  (SARS-CoV-2) nucleic acids may be present in the submitted sample  additional confirmatory testing may be necessary for epidemiological  and / or clinical management purposes  to differentiate between  SARS-CoV-2 and other Sarbecovirus currently known to infect humans.  If clinically indicated additional testing with an alternate test  methodology [id number]) is advised. The SARS-CoV-2 RNA is generally  detectable in upper and lower respiratory sp ecimens during the acute  phase of infection. The expected result is Negative. Fact Sheet for Patients:  StrictlyIdeas.no Fact Sheet for Healthcare Providers: BankingDealers.co.za This test is not yet approved or cleared by the [place] FDA and has been authorized for detection and/or diagnosis of SARS-CoV-2 by FDA under an Emergency Use Authorization (EUA).  This EUA will remain in effect (meaning this test can be used) for the duration of the COVID-19 declaration under Section 564(b)(1) of the Act, 21 U.S.C. section 360bbb-3(b)(1), unless the authorization is terminated or revoked sooner. Performed at [facility], [address] [place], [place] [address]   MRSA PCR Screening     Status: None   Collection Time: 04/24/19 11:26 PM   Specimen: Nasal Mucosa; Nasopharyngeal  Result Value Ref Range Status   MRSA by PCR NEGATIVE NEGATIVE Final    Comment:  The GeneXpert MRSA Assay (FDA approved for NASAL specimens only), is one component of a comprehensive MRSA colonization surveillance program. It is not intended to diagnose MRSA infection nor to guide or monitor treatment for MRSA infections. Performed at [facility], [address] [place], [place] [address]      Labs: Basic Metabolic Panel: Recent Labs  Lab 04/24/19 1524 04/25/19 0849 04/26/19 0543 04/27/19 0614  NA 137 138 140 137  K 4.4 4.6 4.4 4.0  CL 107 105 108 105   CO2 23 24 23 25   GLUCOSE 146* 102* 89 93  BUN 97* 94* 85* 75*  CREATININE 3.84* 3.43* 3.28* 3.15*  CALCIUM 8.0* 8.5* 8.4* 8.1*   Liver Function Tests: Recent Labs  Lab 04/25/19 0849  AST 17  ALT 18  ALKPHOS 69  BILITOT 0.9  PROT 5.4*  ALBUMIN 2.6*   No results for input(s): LIPASE, AMYLASE in the last 168 hours. No results for input(s): AMMONIA in the last 168 hours. CBC: Recent Labs  Lab 04/24/19 1343 04/24/19 1524 04/25/19 0849 04/26/19 0543 04/27/19 0614  WBC  --  9.1 8.3 7.6 5.9  NEUTROABS  --  6.4 6.1  --   --   HGB 4.9* 4.8* 8.0* 7.9* 7.6*  HCT 15.0* 15.0* 24.4* 23.3* 23.3*  MCV  --  100.0 92.1 92.5 94.0  PLT  --  155 146* 151 143*   Cardiac Enzymes: No results for input(s): CKTOTAL, CKMB, CKMBINDEX, TROPONINI in the last 168 hours. BNP: Invalid input(s): POCBNP CBG: Recent Labs  Lab 04/26/19 1954 04/27/19 0045 04/27/19 0605 04/27/19 0755 04/27/19 1136  GLUCAP 157* 108* 102* 94 103*    Time coordinating discharge:  36 minutes  Signed:  Orson Eva, DO [facility] Pager: [phone] 04/27/2019, 11:45 AM

## 2019-04-28 LAB — TYPE AND SCREEN
ABO/RH(D): A POS
Antibody Screen: NEGATIVE
Unit division: 0
Unit division: 0
Unit division: 0
Unit division: 0

## 2019-04-28 LAB — BPAM RBC
Blood Product Expiration Date: 202008082359
Blood Product Expiration Date: 202008082359
Blood Product Expiration Date: 202008082359
Blood Product Expiration Date: 202008082359
ISSUE DATE / TIME: 202007141901
ISSUE DATE / TIME: 202007150002
ISSUE DATE / TIME: 202007150310
Unit Type and Rh: 6200
Unit Type and Rh: 6200
Unit Type and Rh: 6200
Unit Type and Rh: 6200

## 2019-05-09 ENCOUNTER — Other Ambulatory Visit: Payer: Self-pay

## 2019-05-09 ENCOUNTER — Encounter (HOSPITAL_COMMUNITY)
Admission: RE | Admit: 2019-05-09 | Discharge: 2019-05-09 | Disposition: A | Discharge status: Home / Self Care | Payer: Medicare Other | Source: Ambulatory Visit | Attending: Nephrology | Admitting: Nephrology

## 2019-05-09 ENCOUNTER — Encounter (HOSPITAL_COMMUNITY): Payer: Self-pay

## 2019-05-09 DIAGNOSIS — D631 Anemia in chronic kidney disease: Secondary | ICD-10-CM | POA: Diagnosis not present

## 2019-05-09 DIAGNOSIS — N185 Chronic kidney disease, stage 5: Secondary | ICD-10-CM | POA: Diagnosis not present

## 2019-05-09 LAB — POCT HEMOGLOBIN-HEMACUE: Hemoglobin: 7.5 g/dL — ABNORMAL LOW (ref 13.0–17.0)

## 2019-05-09 MED ORDER — DARBEPOETIN ALFA 60 MCG/0.3ML IJ SOSY
PREFILLED_SYRINGE | INTRAMUSCULAR | Status: AC
  Filled 2019-05-09: qty 0.3

## 2019-05-09 MED ORDER — DARBEPOETIN ALFA 100 MCG/0.5ML IJ SOSY
60.0000 ug | PREFILLED_SYRINGE | Freq: Once | INTRAMUSCULAR | Status: AC
  Administered 2019-05-09: 14:00:00 60 ug via SUBCUTANEOUS

## 2019-05-09 NOTE — Progress Notes (Signed)
Patient had a Hemoglobin of 7.5. Called Dr. Rhona Leavens office and informed them of result. I was instructed to give his Aranesp shot as usual. No further instructions were given.

## 2019-05-16 ENCOUNTER — Other Ambulatory Visit: Payer: Self-pay

## 2019-05-16 ENCOUNTER — Encounter (HOSPITAL_COMMUNITY)
Admission: RE | Admit: 2019-05-16 | Discharge: 2019-05-16 | Disposition: A | Discharge status: Home / Self Care | Payer: Medicare Other | Source: Ambulatory Visit | Attending: Nephrology | Admitting: Nephrology

## 2019-05-16 ENCOUNTER — Encounter (HOSPITAL_COMMUNITY): Payer: Self-pay

## 2019-05-16 DIAGNOSIS — D631 Anemia in chronic kidney disease: Secondary | ICD-10-CM | POA: Insufficient documentation

## 2019-05-16 DIAGNOSIS — N185 Chronic kidney disease, stage 5: Secondary | ICD-10-CM | POA: Insufficient documentation

## 2019-05-16 LAB — RENAL FUNCTION PANEL
Albumin: 2.7 g/dL — ABNORMAL LOW (ref 3.5–5.0)
Anion gap: 10 (ref 5–15)
BUN: 54 mg/dL — ABNORMAL HIGH (ref 8–23)
CO2: 25 mmol/L (ref 22–32)
Calcium: 8.6 mg/dL — ABNORMAL LOW (ref 8.9–10.3)
Chloride: 109 mmol/L (ref 98–111)
Creatinine, Ser: 3.32 mg/dL — ABNORMAL HIGH (ref 0.61–1.24)
GFR calc Af Amer: 19 mL/min — ABNORMAL LOW (ref >60)
GFR calc non Af Amer: 16 mL/min — ABNORMAL LOW (ref >60)
Glucose, Bld: 98 mg/dL (ref 70–99)
Phosphorus: 4 mg/dL (ref 2.5–4.6)
Potassium: 4.2 mmol/L (ref 3.5–5.1)
Sodium: 144 mmol/L (ref 135–145)

## 2019-05-16 LAB — PROTEIN / CREATININE RATIO, URINE
Creatinine, Urine: 53.94 mg/dL
Protein Creatinine Ratio: 1.35 mg/mg{Cre} — ABNORMAL HIGH (ref 0.00–0.15)
Total Protein, Urine: 73 mg/dL

## 2019-05-16 LAB — POCT HEMOGLOBIN-HEMACUE: Hemoglobin: 7.2 g/dL — ABNORMAL LOW (ref 13.0–17.0)

## 2019-05-16 MED ORDER — DARBEPOETIN ALFA 60 MCG/0.3ML IJ SOSY
60.0000 ug | PREFILLED_SYRINGE | Freq: Once | INTRAMUSCULAR | Status: AC
  Administered 2019-05-16: 09:00:00 60 ug via SUBCUTANEOUS
  Filled 2019-05-16: qty 0.3

## 2019-05-17 LAB — PTH, INTACT AND CALCIUM
Calcium, Total (PTH): 8.6 mg/dL (ref 8.6–10.2)
PTH: 33 pg/mL (ref 15–65)

## 2019-05-17 LAB — VITAMIN D 25 HYDROXY (VIT D DEFICIENCY, FRACTURES): Vit D, 25-Hydroxy: 81.5 ng/mL (ref 30.0–100.0)

## 2019-05-23 ENCOUNTER — Other Ambulatory Visit: Payer: Self-pay

## 2019-05-23 ENCOUNTER — Encounter (HOSPITAL_COMMUNITY)
Admission: RE | Admit: 2019-05-23 | Discharge: 2019-05-23 | Disposition: A | Discharge status: Home / Self Care | Payer: Medicare Other | Source: Ambulatory Visit | Attending: Nephrology | Admitting: Nephrology

## 2019-05-23 DIAGNOSIS — N185 Chronic kidney disease, stage 5: Secondary | ICD-10-CM | POA: Diagnosis not present

## 2019-05-23 LAB — POCT HEMOGLOBIN-HEMACUE: Hemoglobin: 7.2 g/dL — ABNORMAL LOW (ref 13.0–17.0)

## 2019-05-23 MED ORDER — DARBEPOETIN ALFA 60 MCG/0.3ML IJ SOSY
60.0000 ug | PREFILLED_SYRINGE | Freq: Once | INTRAMUSCULAR | Status: AC
  Administered 2019-05-23: 60 ug via SUBCUTANEOUS

## 2019-05-23 MED ORDER — DARBEPOETIN ALFA 60 MCG/0.3ML IJ SOSY
PREFILLED_SYRINGE | INTRAMUSCULAR | Status: AC
  Filled 2019-05-23: qty 0.3

## 2019-05-23 NOTE — Progress Notes (Signed)
Results for RIGGINS, HANSON (MRN [id number]) as of 05/23/2019 09:23  Ref. Range 05/23/2019 09:03  Hemoglobin Latest Ref Range: 13.0 - 17.0 g/dL 7.2 (L)

## 2019-05-30 ENCOUNTER — Other Ambulatory Visit: Payer: Self-pay

## 2019-05-30 ENCOUNTER — Encounter (HOSPITAL_COMMUNITY): Payer: Self-pay

## 2019-05-30 ENCOUNTER — Encounter (HOSPITAL_COMMUNITY)
Admission: RE | Admit: 2019-05-30 | Discharge: 2019-05-30 | Disposition: A | Discharge status: Home / Self Care | Payer: Medicare Other | Source: Ambulatory Visit | Attending: Nephrology | Admitting: Nephrology

## 2019-05-30 DIAGNOSIS — N185 Chronic kidney disease, stage 5: Secondary | ICD-10-CM | POA: Diagnosis not present

## 2019-05-30 LAB — POCT HEMOGLOBIN-HEMACUE: Hemoglobin: 8 g/dL — ABNORMAL LOW (ref 13.0–17.0)

## 2019-05-30 MED ORDER — DARBEPOETIN ALFA 60 MCG/0.3ML IJ SOSY
PREFILLED_SYRINGE | INTRAMUSCULAR | Status: AC
  Filled 2019-05-30: qty 0.3

## 2019-05-30 MED ORDER — DARBEPOETIN ALFA 60 MCG/0.3ML IJ SOSY
60.0000 ug | PREFILLED_SYRINGE | INTRAMUSCULAR | Status: DC
  Administered 2019-05-30: 60 ug via SUBCUTANEOUS

## 2019-06-06 ENCOUNTER — Other Ambulatory Visit: Payer: Self-pay

## 2019-06-06 ENCOUNTER — Encounter (HOSPITAL_COMMUNITY)
Admission: RE | Admit: 2019-06-06 | Discharge: 2019-06-06 | Disposition: A | Discharge status: Home / Self Care | Payer: Medicare Other | Source: Ambulatory Visit | Attending: Nephrology | Admitting: Nephrology

## 2019-06-06 DIAGNOSIS — N185 Chronic kidney disease, stage 5: Secondary | ICD-10-CM | POA: Diagnosis not present

## 2019-06-06 LAB — POCT HEMOGLOBIN-HEMACUE: Hemoglobin: 8.7 g/dL — ABNORMAL LOW (ref 13.0–17.0)

## 2019-06-06 MED ORDER — DARBEPOETIN ALFA 60 MCG/0.3ML IJ SOSY
60.0000 ug | PREFILLED_SYRINGE | Freq: Once | INTRAMUSCULAR | Status: AC
  Administered 2019-06-06: 60 ug via SUBCUTANEOUS
  Filled 2019-06-06: qty 0.3

## 2019-06-13 ENCOUNTER — Encounter (HOSPITAL_COMMUNITY)
Admission: RE | Admit: 2019-06-13 | Discharge: 2019-06-13 | Disposition: A | Discharge status: Home / Self Care | Payer: Medicare Other | Source: Ambulatory Visit | Attending: Nephrology | Admitting: Nephrology

## 2019-06-13 ENCOUNTER — Other Ambulatory Visit: Payer: Self-pay

## 2019-06-13 DIAGNOSIS — N185 Chronic kidney disease, stage 5: Secondary | ICD-10-CM | POA: Insufficient documentation

## 2019-06-13 DIAGNOSIS — D631 Anemia in chronic kidney disease: Secondary | ICD-10-CM | POA: Diagnosis not present

## 2019-06-13 LAB — POCT HEMOGLOBIN-HEMACUE: Hemoglobin: 8.2 g/dL — ABNORMAL LOW (ref 13.0–17.0)

## 2019-06-13 MED ORDER — DARBEPOETIN ALFA 60 MCG/0.3ML IJ SOSY
60.0000 ug | PREFILLED_SYRINGE | INTRAMUSCULAR | Status: DC
  Administered 2019-06-13: 09:00:00 60 ug via SUBCUTANEOUS
  Filled 2019-06-13: qty 0.3

## 2019-06-20 ENCOUNTER — Encounter (HOSPITAL_COMMUNITY): Payer: Self-pay

## 2019-06-20 ENCOUNTER — Encounter (HOSPITAL_COMMUNITY)
Admission: RE | Admit: 2019-06-20 | Discharge: 2019-06-20 | Disposition: A | Discharge status: Home / Self Care | Payer: Medicare Other | Source: Ambulatory Visit | Attending: Nephrology | Admitting: Nephrology

## 2019-06-20 ENCOUNTER — Other Ambulatory Visit: Payer: Self-pay

## 2019-06-20 DIAGNOSIS — N185 Chronic kidney disease, stage 5: Secondary | ICD-10-CM | POA: Diagnosis not present

## 2019-06-20 LAB — POCT HEMOGLOBIN-HEMACUE: Hemoglobin: 8.9 g/dL — ABNORMAL LOW (ref 13.0–17.0)

## 2019-06-20 MED ORDER — DARBEPOETIN ALFA 60 MCG/0.3ML IJ SOSY
60.0000 ug | PREFILLED_SYRINGE | Freq: Once | INTRAMUSCULAR | Status: AC
  Administered 2019-06-20: 60 ug via SUBCUTANEOUS
  Filled 2019-06-20: qty 0.3

## 2019-06-28 ENCOUNTER — Encounter (HOSPITAL_COMMUNITY)
Admission: RE | Admit: 2019-06-28 | Discharge: 2019-06-28 | Disposition: A | Discharge status: Home / Self Care | Payer: Medicare Other | Source: Ambulatory Visit | Attending: Nephrology | Admitting: Nephrology

## 2019-06-28 ENCOUNTER — Other Ambulatory Visit: Payer: Self-pay

## 2019-06-28 DIAGNOSIS — N185 Chronic kidney disease, stage 5: Secondary | ICD-10-CM | POA: Diagnosis not present

## 2019-06-28 LAB — POCT HEMOGLOBIN-HEMACUE: Hemoglobin: 8.9 g/dL — ABNORMAL LOW (ref 13.0–17.0)

## 2019-06-28 MED ORDER — DARBEPOETIN ALFA 60 MCG/0.3ML IJ SOSY
60.0000 ug | PREFILLED_SYRINGE | Freq: Once | INTRAMUSCULAR | Status: AC
  Administered 2019-06-28: 60 ug via SUBCUTANEOUS

## 2019-06-28 MED ORDER — DARBEPOETIN ALFA 60 MCG/0.3ML IJ SOSY
PREFILLED_SYRINGE | INTRAMUSCULAR | Status: AC
  Filled 2019-06-28: qty 0.3

## 2019-07-05 ENCOUNTER — Other Ambulatory Visit: Payer: Self-pay

## 2019-07-05 ENCOUNTER — Encounter (HOSPITAL_COMMUNITY)
Admission: RE | Admit: 2019-07-05 | Discharge: 2019-07-05 | Disposition: A | Discharge status: Home / Self Care | Payer: Medicare Other | Source: Ambulatory Visit | Attending: Nephrology | Admitting: Nephrology

## 2019-07-05 ENCOUNTER — Encounter (HOSPITAL_COMMUNITY): Payer: Self-pay

## 2019-07-05 DIAGNOSIS — N185 Chronic kidney disease, stage 5: Secondary | ICD-10-CM | POA: Diagnosis not present

## 2019-07-05 LAB — POCT HEMOGLOBIN-HEMACUE: Hemoglobin: 8.9 g/dL — ABNORMAL LOW (ref 13.0–17.0)

## 2019-07-05 MED ORDER — DARBEPOETIN ALFA 60 MCG/0.3ML IJ SOSY
60.0000 ug | PREFILLED_SYRINGE | Freq: Once | INTRAMUSCULAR | Status: AC
  Administered 2019-07-05: 09:00:00 60 ug via SUBCUTANEOUS
  Filled 2019-07-05: qty 0.3

## 2019-07-12 ENCOUNTER — Encounter (HOSPITAL_COMMUNITY)
Admission: RE | Admit: 2019-07-12 | Discharge: 2019-07-12 | Disposition: A | Discharge status: Home / Self Care | Payer: Medicare Other | Source: Ambulatory Visit | Attending: Nephrology | Admitting: Nephrology

## 2019-07-12 ENCOUNTER — Encounter (HOSPITAL_COMMUNITY): Payer: Self-pay

## 2019-07-12 ENCOUNTER — Other Ambulatory Visit: Payer: Self-pay

## 2019-07-12 DIAGNOSIS — D631 Anemia in chronic kidney disease: Secondary | ICD-10-CM | POA: Insufficient documentation

## 2019-07-12 DIAGNOSIS — N185 Chronic kidney disease, stage 5: Secondary | ICD-10-CM | POA: Diagnosis present

## 2019-07-12 LAB — IRON AND TIBC
Iron: 30 ug/dL — ABNORMAL LOW (ref 45–182)
Saturation Ratios: 10 % — ABNORMAL LOW (ref 17.9–39.5)
TIBC: 307 ug/dL (ref 250–450)
UIBC: 277 ug/dL

## 2019-07-12 LAB — RENAL FUNCTION PANEL
Albumin: 3.2 g/dL — ABNORMAL LOW (ref 3.5–5.0)
Anion gap: 14 (ref 5–15)
BUN: 87 mg/dL — ABNORMAL HIGH (ref 8–23)
CO2: 26 mmol/L (ref 22–32)
Calcium: 8.6 mg/dL — ABNORMAL LOW (ref 8.9–10.3)
Chloride: 101 mmol/L (ref 98–111)
Creatinine, Ser: 4.75 mg/dL — ABNORMAL HIGH (ref 0.61–1.24)
GFR calc Af Amer: 12 mL/min — ABNORMAL LOW (ref >60)
GFR calc non Af Amer: 11 mL/min — ABNORMAL LOW (ref >60)
Glucose, Bld: 110 mg/dL — ABNORMAL HIGH (ref 70–99)
Phosphorus: 5.5 mg/dL — ABNORMAL HIGH (ref 2.5–4.6)
Potassium: 3.7 mmol/L (ref 3.5–5.1)
Sodium: 141 mmol/L (ref 135–145)

## 2019-07-12 LAB — FERRITIN: Ferritin: 39 ng/mL (ref 24–336)

## 2019-07-12 LAB — POCT HEMOGLOBIN-HEMACUE: Hemoglobin: 9.3 g/dL — ABNORMAL LOW (ref 13.0–17.0)

## 2019-07-12 MED ORDER — DARBEPOETIN ALFA 60 MCG/0.3ML IJ SOSY
60.0000 ug | PREFILLED_SYRINGE | INTRAMUSCULAR | Status: DC
  Administered 2019-07-12: 60 ug via SUBCUTANEOUS
  Filled 2019-07-12: qty 0.3

## 2019-07-19 ENCOUNTER — Encounter (HOSPITAL_COMMUNITY)
Admission: RE | Admit: 2019-07-19 | Discharge: 2019-07-19 | Disposition: A | Discharge status: Home / Self Care | Payer: Medicare Other | Source: Ambulatory Visit | Attending: Nephrology | Admitting: Nephrology

## 2019-07-19 ENCOUNTER — Other Ambulatory Visit: Payer: Self-pay

## 2019-07-19 ENCOUNTER — Encounter (HOSPITAL_COMMUNITY): Payer: Self-pay

## 2019-07-19 DIAGNOSIS — N185 Chronic kidney disease, stage 5: Secondary | ICD-10-CM | POA: Diagnosis not present

## 2019-07-19 LAB — POCT HEMOGLOBIN-HEMACUE: Hemoglobin: 9.5 g/dL — ABNORMAL LOW (ref 13.0–17.0)

## 2019-07-19 MED ORDER — DARBEPOETIN ALFA 60 MCG/0.3ML IJ SOSY
60.0000 ug | PREFILLED_SYRINGE | Freq: Once | INTRAMUSCULAR | Status: AC
  Administered 2019-07-19: 09:00:00 60 ug via SUBCUTANEOUS

## 2019-07-19 MED ORDER — DARBEPOETIN ALFA 60 MCG/0.3ML IJ SOSY
PREFILLED_SYRINGE | INTRAMUSCULAR | Status: AC
  Filled 2019-07-19: qty 0.3

## 2019-07-24 ENCOUNTER — Other Ambulatory Visit: Payer: Self-pay

## 2019-07-24 ENCOUNTER — Encounter (HOSPITAL_COMMUNITY): Payer: Self-pay

## 2019-07-24 ENCOUNTER — Encounter (HOSPITAL_COMMUNITY)
Admission: RE | Admit: 2019-07-24 | Discharge: 2019-07-24 | Disposition: A | Discharge status: Home / Self Care | Payer: Medicare Other | Source: Ambulatory Visit | Attending: Nephrology | Admitting: Nephrology

## 2019-07-24 DIAGNOSIS — D509 Iron deficiency anemia, unspecified: Secondary | ICD-10-CM | POA: Diagnosis not present

## 2019-07-24 DIAGNOSIS — N185 Chronic kidney disease, stage 5: Secondary | ICD-10-CM | POA: Diagnosis present

## 2019-07-24 LAB — RENAL FUNCTION PANEL
Albumin: 3.1 g/dL — ABNORMAL LOW (ref 3.5–5.0)
Anion gap: 15 (ref 5–15)
BUN: 98 mg/dL — ABNORMAL HIGH (ref 8–23)
CO2: 25 mmol/L (ref 22–32)
Calcium: 8.7 mg/dL — ABNORMAL LOW (ref 8.9–10.3)
Chloride: 102 mmol/L (ref 98–111)
Creatinine, Ser: 4.42 mg/dL — ABNORMAL HIGH (ref 0.61–1.24)
GFR calc Af Amer: 13 mL/min — ABNORMAL LOW (ref >60)
GFR calc non Af Amer: 12 mL/min — ABNORMAL LOW (ref >60)
Glucose, Bld: 151 mg/dL — ABNORMAL HIGH (ref 70–99)
Phosphorus: 4.8 mg/dL — ABNORMAL HIGH (ref 2.5–4.6)
Potassium: 3.5 mmol/L (ref 3.5–5.1)
Sodium: 142 mmol/L (ref 135–145)

## 2019-07-24 LAB — HEMOGLOBIN AND HEMATOCRIT, BLOOD
HCT: 31.2 % — ABNORMAL LOW (ref 39.0–52.0)
Hemoglobin: 9.7 g/dL — ABNORMAL LOW (ref 13.0–17.0)

## 2019-07-24 LAB — PROTEIN / CREATININE RATIO, URINE
Creatinine, Urine: 48.06 mg/dL
Protein Creatinine Ratio: 1.94 mg/mg{Cre} — ABNORMAL HIGH (ref 0.00–0.15)
Total Protein, Urine: 93 mg/dL

## 2019-07-24 LAB — VITAMIN D 25 HYDROXY (VIT D DEFICIENCY, FRACTURES): Vit D, 25-Hydroxy: 138.81 ng/mL — ABNORMAL HIGH (ref 30–100)

## 2019-07-24 MED ORDER — SODIUM CHLORIDE 0.9 % IV SOLN
510.0000 mg | Freq: Once | INTRAVENOUS | Status: AC
  Administered 2019-07-24: 10:00:00 510 mg via INTRAVENOUS
  Filled 2019-07-24: qty 510

## 2019-07-24 MED ORDER — SODIUM CHLORIDE 0.9 % IV SOLN
Freq: Once | INTRAVENOUS | Status: AC
  Administered 2019-07-24: 09:00:00 via INTRAVENOUS

## 2019-07-25 LAB — PTH, INTACT AND CALCIUM
Calcium, Total (PTH): 8.4 mg/dL — ABNORMAL LOW (ref 8.6–10.2)
PTH: 75 pg/mL — ABNORMAL HIGH (ref 15–65)

## 2019-07-26 ENCOUNTER — Encounter (HOSPITAL_COMMUNITY)
Admission: RE | Admit: 2019-07-26 | Discharge: 2019-07-26 | Disposition: A | Discharge status: Home / Self Care | Payer: Medicare Other | Source: Ambulatory Visit | Attending: Nephrology | Admitting: Nephrology

## 2019-07-26 ENCOUNTER — Other Ambulatory Visit: Payer: Self-pay

## 2019-07-26 ENCOUNTER — Encounter (HOSPITAL_COMMUNITY): Payer: Self-pay

## 2019-07-26 DIAGNOSIS — N185 Chronic kidney disease, stage 5: Secondary | ICD-10-CM | POA: Diagnosis not present

## 2019-07-26 LAB — POCT HEMOGLOBIN-HEMACUE: Hemoglobin: 9.8 g/dL — ABNORMAL LOW (ref 13.0–17.0)

## 2019-07-26 MED ORDER — DARBEPOETIN ALFA 60 MCG/0.3ML IJ SOSY
60.0000 ug | PREFILLED_SYRINGE | INTRAMUSCULAR | Status: DC
  Administered 2019-07-26: 10:00:00 60 ug via SUBCUTANEOUS

## 2019-07-26 MED ORDER — DARBEPOETIN ALFA 60 MCG/0.3ML IJ SOSY
PREFILLED_SYRINGE | INTRAMUSCULAR | Status: AC
  Filled 2019-07-26: qty 0.3

## 2019-07-31 ENCOUNTER — Encounter (HOSPITAL_COMMUNITY)
Admission: RE | Admit: 2019-07-31 | Discharge: 2019-07-31 | Disposition: A | Discharge status: Home / Self Care | Payer: Medicare Other | Source: Ambulatory Visit | Attending: Nephrology | Admitting: Nephrology

## 2019-07-31 ENCOUNTER — Other Ambulatory Visit: Payer: Self-pay

## 2019-07-31 ENCOUNTER — Encounter (HOSPITAL_COMMUNITY): Payer: Self-pay

## 2019-07-31 DIAGNOSIS — N185 Chronic kidney disease, stage 5: Secondary | ICD-10-CM | POA: Diagnosis not present

## 2019-07-31 MED ORDER — SODIUM CHLORIDE 0.9 % IV SOLN
Freq: Once | INTRAVENOUS | Status: AC
  Administered 2019-07-31: 09:00:00 via INTRAVENOUS

## 2019-07-31 MED ORDER — SODIUM CHLORIDE 0.9 % IV SOLN
510.0000 mg | Freq: Once | INTRAVENOUS | Status: AC
  Administered 2019-07-31: 09:00:00 510 mg via INTRAVENOUS
  Filled 2019-07-31: qty 510

## 2019-08-02 ENCOUNTER — Other Ambulatory Visit: Payer: Self-pay

## 2019-08-02 ENCOUNTER — Encounter (HOSPITAL_COMMUNITY): Payer: Self-pay

## 2019-08-02 ENCOUNTER — Encounter (HOSPITAL_COMMUNITY)
Admission: RE | Admit: 2019-08-02 | Discharge: 2019-08-02 | Disposition: A | Discharge status: Home / Self Care | Payer: Medicare Other | Source: Ambulatory Visit | Attending: Nephrology | Admitting: Nephrology

## 2019-08-02 DIAGNOSIS — N185 Chronic kidney disease, stage 5: Secondary | ICD-10-CM | POA: Diagnosis not present

## 2019-08-02 LAB — CBC
HCT: 32.2 % — ABNORMAL LOW (ref 39.0–52.0)
Hemoglobin: 9.9 g/dL — ABNORMAL LOW (ref 13.0–17.0)
MCH: 29.8 pg (ref 26.0–34.0)
MCHC: 30.7 g/dL (ref 30.0–36.0)
MCV: 97 fL (ref 80.0–100.0)
Platelets: 116 10*3/uL — ABNORMAL LOW (ref 150–400)
RBC: 3.32 MIL/uL — ABNORMAL LOW (ref 4.22–5.81)
RDW: 15.4 % (ref 11.5–15.5)
WBC: 6.4 10*3/uL (ref 4.0–10.5)
nRBC: 0 % (ref 0.0–0.2)

## 2019-08-02 LAB — RENAL FUNCTION PANEL
Albumin: 3.3 g/dL — ABNORMAL LOW (ref 3.5–5.0)
Anion gap: 13 (ref 5–15)
BUN: 97 mg/dL — ABNORMAL HIGH (ref 8–23)
CO2: 27 mmol/L (ref 22–32)
Calcium: 8.8 mg/dL — ABNORMAL LOW (ref 8.9–10.3)
Chloride: 100 mmol/L (ref 98–111)
Creatinine, Ser: 4.42 mg/dL — ABNORMAL HIGH (ref 0.61–1.24)
GFR calc Af Amer: 13 mL/min — ABNORMAL LOW (ref >60)
GFR calc non Af Amer: 12 mL/min — ABNORMAL LOW (ref >60)
Glucose, Bld: 137 mg/dL — ABNORMAL HIGH (ref 70–99)
Phosphorus: 4.6 mg/dL (ref 2.5–4.6)
Potassium: 3.6 mmol/L (ref 3.5–5.1)
Sodium: 140 mmol/L (ref 135–145)

## 2019-08-02 LAB — IRON AND TIBC
Iron: 142 ug/dL (ref 45–182)
Saturation Ratios: 49 % — ABNORMAL HIGH (ref 17.9–39.5)
TIBC: 289 ug/dL (ref 250–450)
UIBC: 147 ug/dL

## 2019-08-02 LAB — POCT HEMOGLOBIN-HEMACUE: Hemoglobin: 10 g/dL — ABNORMAL LOW (ref 13.0–17.0)

## 2019-08-02 LAB — PROTEIN / CREATININE RATIO, URINE
Creatinine, Urine: 48.96 mg/dL
Protein Creatinine Ratio: 2.33 mg/mg{Cre} — ABNORMAL HIGH (ref 0.00–0.15)
Total Protein, Urine: 114 mg/dL

## 2019-08-02 LAB — VITAMIN D 25 HYDROXY (VIT D DEFICIENCY, FRACTURES): Vit D, 25-Hydroxy: 148.9 ng/mL — ABNORMAL HIGH (ref 30–100)

## 2019-08-02 MED ORDER — DARBEPOETIN ALFA 60 MCG/0.3ML IJ SOSY: 60.0000 ug | PREFILLED_SYRINGE | Freq: Once | INTRAMUSCULAR | Status: DC

## 2019-08-03 LAB — PTH, INTACT AND CALCIUM
Calcium, Total (PTH): 8.9 mg/dL (ref 8.6–10.2)
PTH: 49 pg/mL (ref 15–65)

## 2019-08-09 ENCOUNTER — Encounter (HOSPITAL_COMMUNITY): Payer: Medicare Other

## 2019-08-09 ENCOUNTER — Encounter (HOSPITAL_COMMUNITY): Admission: RE | Admit: 2019-08-09 | Payer: Medicare Other | Source: Ambulatory Visit

## 2019-08-16 ENCOUNTER — Encounter (HOSPITAL_COMMUNITY)
Admission: RE | Admit: 2019-08-16 | Discharge: 2019-08-16 | Disposition: A | Discharge status: Home / Self Care | Payer: Medicare Other | Source: Ambulatory Visit | Attending: Nephrology | Admitting: Nephrology

## 2019-08-16 ENCOUNTER — Other Ambulatory Visit: Payer: Self-pay

## 2019-08-16 ENCOUNTER — Encounter (HOSPITAL_COMMUNITY): Payer: Self-pay

## 2019-08-16 DIAGNOSIS — D631 Anemia in chronic kidney disease: Secondary | ICD-10-CM | POA: Diagnosis not present

## 2019-08-16 DIAGNOSIS — N185 Chronic kidney disease, stage 5: Secondary | ICD-10-CM | POA: Insufficient documentation

## 2019-08-16 LAB — POCT HEMOGLOBIN-HEMACUE: Hemoglobin: 10.3 g/dL — ABNORMAL LOW (ref 13.0–17.0)

## 2019-08-16 MED ORDER — DARBEPOETIN ALFA 60 MCG/0.3ML IJ SOSY: 60.0000 ug | PREFILLED_SYRINGE | Freq: Once | INTRAMUSCULAR | Status: DC

## 2019-08-23 ENCOUNTER — Encounter (HOSPITAL_COMMUNITY)
Admission: RE | Admit: 2019-08-23 | Discharge: 2019-08-23 | Disposition: A | Discharge status: Home / Self Care | Payer: Medicare Other | Source: Ambulatory Visit | Attending: Nephrology | Admitting: Nephrology

## 2019-08-23 ENCOUNTER — Other Ambulatory Visit: Payer: Self-pay

## 2019-08-23 ENCOUNTER — Encounter (HOSPITAL_COMMUNITY): Payer: Self-pay

## 2019-08-23 DIAGNOSIS — N185 Chronic kidney disease, stage 5: Secondary | ICD-10-CM | POA: Diagnosis not present

## 2019-08-23 LAB — POCT HEMOGLOBIN-HEMACUE: Hemoglobin: 9.4 g/dL — ABNORMAL LOW (ref 13.0–17.0)

## 2019-08-23 MED ORDER — DARBEPOETIN ALFA 60 MCG/0.3ML IJ SOSY
60.0000 ug | PREFILLED_SYRINGE | Freq: Once | INTRAMUSCULAR | Status: AC
  Administered 2019-08-23: 60 ug via SUBCUTANEOUS
  Filled 2019-08-23: qty 0.3

## 2019-08-30 ENCOUNTER — Encounter (HOSPITAL_COMMUNITY)
Admission: RE | Admit: 2019-08-30 | Discharge: 2019-08-30 | Disposition: A | Discharge status: Home / Self Care | Payer: Medicare Other | Source: Ambulatory Visit | Attending: Nephrology | Admitting: Nephrology

## 2019-08-30 ENCOUNTER — Other Ambulatory Visit: Payer: Self-pay

## 2019-08-30 ENCOUNTER — Encounter (HOSPITAL_COMMUNITY): Payer: Self-pay

## 2019-08-30 DIAGNOSIS — N185 Chronic kidney disease, stage 5: Secondary | ICD-10-CM | POA: Diagnosis not present

## 2019-08-30 LAB — POCT HEMOGLOBIN-HEMACUE: Hemoglobin: 10.5 g/dL — ABNORMAL LOW (ref 13.0–17.0)

## 2019-08-30 MED ORDER — DARBEPOETIN ALFA 60 MCG/0.3ML IJ SOSY: 60.0000 ug | PREFILLED_SYRINGE | INTRAMUSCULAR | Status: DC

## 2019-09-10 ENCOUNTER — Encounter (HOSPITAL_COMMUNITY): Admission: RE | Admit: 2019-09-10 | Payer: Medicare Other | Source: Ambulatory Visit

## 2019-09-10 ENCOUNTER — Encounter (HOSPITAL_COMMUNITY): Payer: Medicare Other

## 2019-09-12 ENCOUNTER — Encounter (HOSPITAL_COMMUNITY): Payer: Self-pay

## 2019-09-12 ENCOUNTER — Encounter (HOSPITAL_COMMUNITY)
Admission: RE | Admit: 2019-09-12 | Discharge: 2019-09-12 | Disposition: A | Discharge status: Home / Self Care | Payer: Medicare Other | Source: Ambulatory Visit | Attending: Nephrology | Admitting: Nephrology

## 2019-09-12 ENCOUNTER — Other Ambulatory Visit: Payer: Self-pay

## 2019-09-12 DIAGNOSIS — N185 Chronic kidney disease, stage 5: Secondary | ICD-10-CM | POA: Diagnosis not present

## 2019-09-12 DIAGNOSIS — D631 Anemia in chronic kidney disease: Secondary | ICD-10-CM | POA: Diagnosis not present

## 2019-09-12 LAB — PROTEIN / CREATININE RATIO, URINE
Creatinine, Urine: 39.1 mg/dL
Protein Creatinine Ratio: 2.02 mg/mg{Cre} — ABNORMAL HIGH (ref 0.00–0.15)
Total Protein, Urine: 79 mg/dL

## 2019-09-12 LAB — RENAL FUNCTION PANEL
Albumin: 3.2 g/dL — ABNORMAL LOW (ref 3.5–5.0)
Anion gap: 12 (ref 5–15)
BUN: 95 mg/dL — ABNORMAL HIGH (ref 8–23)
CO2: 26 mmol/L (ref 22–32)
Calcium: 8.8 mg/dL — ABNORMAL LOW (ref 8.9–10.3)
Chloride: 101 mmol/L (ref 98–111)
Creatinine, Ser: 4.3 mg/dL — ABNORMAL HIGH (ref 0.61–1.24)
GFR calc Af Amer: 14 mL/min — ABNORMAL LOW (ref >60)
GFR calc non Af Amer: 12 mL/min — ABNORMAL LOW (ref >60)
Glucose, Bld: 145 mg/dL — ABNORMAL HIGH (ref 70–99)
Phosphorus: 4.7 mg/dL — ABNORMAL HIGH (ref 2.5–4.6)
Potassium: 3.7 mmol/L (ref 3.5–5.1)
Sodium: 139 mmol/L (ref 135–145)

## 2019-09-12 LAB — POCT HEMOGLOBIN-HEMACUE: Hemoglobin: 9.4 g/dL — ABNORMAL LOW (ref 13.0–17.0)

## 2019-09-12 LAB — CBC
HCT: 29 % — ABNORMAL LOW (ref 39.0–52.0)
Hemoglobin: 9.4 g/dL — ABNORMAL LOW (ref 13.0–17.0)
MCH: 30.6 pg (ref 26.0–34.0)
MCHC: 32.4 g/dL (ref 30.0–36.0)
MCV: 94.5 fL (ref 80.0–100.0)
Platelets: 110 10*3/uL — ABNORMAL LOW (ref 150–400)
RBC: 3.07 MIL/uL — ABNORMAL LOW (ref 4.22–5.81)
RDW: 15.2 % (ref 11.5–15.5)
WBC: 6.4 10*3/uL (ref 4.0–10.5)
nRBC: 0 % (ref 0.0–0.2)

## 2019-09-12 LAB — IRON AND TIBC
Iron: 59 ug/dL (ref 45–182)
Saturation Ratios: 25 % (ref 17.9–39.5)
TIBC: 239 ug/dL — ABNORMAL LOW (ref 250–450)
UIBC: 180 ug/dL

## 2019-09-12 LAB — VITAMIN D 25 HYDROXY (VIT D DEFICIENCY, FRACTURES): Vit D, 25-Hydroxy: 113.97 ng/mL — ABNORMAL HIGH (ref 30–100)

## 2019-09-12 LAB — FERRITIN: Ferritin: 259 ng/mL (ref 24–336)

## 2019-09-12 MED ORDER — DARBEPOETIN ALFA 60 MCG/0.3ML IJ SOSY
60.0000 ug | PREFILLED_SYRINGE | Freq: Once | INTRAMUSCULAR | Status: AC
  Administered 2019-09-12: 60 ug via SUBCUTANEOUS

## 2019-09-12 MED ORDER — DARBEPOETIN ALFA 60 MCG/0.3ML IJ SOSY
PREFILLED_SYRINGE | INTRAMUSCULAR | Status: AC
  Filled 2019-09-12: qty 0.3

## 2019-09-13 LAB — PARATHYROID HORMONE, INTACT (NO CA): PTH: 62 pg/mL (ref 15–65)

## 2019-09-19 ENCOUNTER — Encounter (HOSPITAL_COMMUNITY)
Admission: RE | Admit: 2019-09-19 | Discharge: 2019-09-19 | Disposition: A | Discharge status: Home / Self Care | Payer: Medicare Other | Source: Ambulatory Visit | Attending: Nephrology | Admitting: Nephrology

## 2019-09-19 ENCOUNTER — Encounter (HOSPITAL_COMMUNITY): Payer: Self-pay

## 2019-09-19 ENCOUNTER — Other Ambulatory Visit: Payer: Self-pay

## 2019-09-19 DIAGNOSIS — N185 Chronic kidney disease, stage 5: Secondary | ICD-10-CM | POA: Diagnosis not present

## 2019-09-19 LAB — POCT HEMOGLOBIN-HEMACUE: Hemoglobin: 9.3 g/dL — ABNORMAL LOW (ref 13.0–17.0)

## 2019-09-19 MED ORDER — DARBEPOETIN ALFA 60 MCG/0.3ML IJ SOSY
60.0000 ug | PREFILLED_SYRINGE | Freq: Once | INTRAMUSCULAR | Status: AC
  Administered 2019-09-19: 60 ug via SUBCUTANEOUS
  Filled 2019-09-19: qty 0.3

## 2019-09-24 ENCOUNTER — Other Ambulatory Visit (HOSPITAL_COMMUNITY): Payer: Medicare Other

## 2019-09-24 ENCOUNTER — Ambulatory Visit (HOSPITAL_COMMUNITY): Payer: Medicare Other

## 2019-09-26 ENCOUNTER — Encounter (HOSPITAL_COMMUNITY): Payer: Medicare Other

## 2019-09-26 ENCOUNTER — Encounter (HOSPITAL_COMMUNITY): Admission: RE | Admit: 2019-09-26 | Payer: Medicare Other | Source: Ambulatory Visit

## 2019-09-28 ENCOUNTER — Encounter (HOSPITAL_COMMUNITY)
Admission: RE | Admit: 2019-09-28 | Discharge: 2019-09-28 | Disposition: A | Discharge status: Home / Self Care | Payer: Medicare Other | Source: Ambulatory Visit | Attending: Nephrology | Admitting: Nephrology

## 2019-09-28 ENCOUNTER — Other Ambulatory Visit: Payer: Self-pay

## 2019-09-28 ENCOUNTER — Encounter (HOSPITAL_COMMUNITY): Payer: Self-pay

## 2019-09-28 DIAGNOSIS — N185 Chronic kidney disease, stage 5: Secondary | ICD-10-CM | POA: Diagnosis not present

## 2019-09-28 LAB — POCT HEMOGLOBIN-HEMACUE: Hemoglobin: 9.7 g/dL — ABNORMAL LOW (ref 13.0–17.0)

## 2019-09-28 MED ORDER — DARBEPOETIN ALFA 60 MCG/0.3ML IJ SOSY
60.0000 ug | PREFILLED_SYRINGE | Freq: Once | INTRAMUSCULAR | Status: AC
  Administered 2019-09-28: 09:00:00 60 ug via SUBCUTANEOUS
  Filled 2019-09-28: qty 0.3

## 2019-10-10 ENCOUNTER — Encounter (HOSPITAL_COMMUNITY): Payer: Self-pay

## 2019-10-10 ENCOUNTER — Encounter (HOSPITAL_COMMUNITY)
Admission: RE | Admit: 2019-10-10 | Discharge: 2019-10-10 | Disposition: A | Discharge status: Home / Self Care | Payer: Medicare Other | Source: Ambulatory Visit | Attending: Nephrology | Admitting: Nephrology

## 2019-10-10 ENCOUNTER — Other Ambulatory Visit: Payer: Self-pay

## 2019-10-10 DIAGNOSIS — N185 Chronic kidney disease, stage 5: Secondary | ICD-10-CM | POA: Diagnosis not present

## 2019-10-10 LAB — IRON AND TIBC
Iron: 63 ug/dL (ref 45–182)
Saturation Ratios: 27 % (ref 17.9–39.5)
TIBC: 232 ug/dL — ABNORMAL LOW (ref 250–450)
UIBC: 169 ug/dL

## 2019-10-10 LAB — CBC
HCT: 30.2 % — ABNORMAL LOW (ref 39.0–52.0)
Hemoglobin: 9.7 g/dL — ABNORMAL LOW (ref 13.0–17.0)
MCH: 31.1 pg (ref 26.0–34.0)
MCHC: 32.1 g/dL (ref 30.0–36.0)
MCV: 96.8 fL (ref 80.0–100.0)
Platelets: 127 10*3/uL — ABNORMAL LOW (ref 150–400)
RBC: 3.12 MIL/uL — ABNORMAL LOW (ref 4.22–5.81)
RDW: 15.7 % — ABNORMAL HIGH (ref 11.5–15.5)
WBC: 7 10*3/uL (ref 4.0–10.5)
nRBC: 0 % (ref 0.0–0.2)

## 2019-10-10 LAB — FERRITIN: Ferritin: 249 ng/mL (ref 24–336)

## 2019-10-10 LAB — RENAL FUNCTION PANEL
Albumin: 3.1 g/dL — ABNORMAL LOW (ref 3.5–5.0)
Anion gap: 12 (ref 5–15)
BUN: 96 mg/dL — ABNORMAL HIGH (ref 8–23)
CO2: 26 mmol/L (ref 22–32)
Calcium: 8.7 mg/dL — ABNORMAL LOW (ref 8.9–10.3)
Chloride: 100 mmol/L (ref 98–111)
Creatinine, Ser: 4.84 mg/dL — ABNORMAL HIGH (ref 0.61–1.24)
GFR calc Af Amer: 12 mL/min — ABNORMAL LOW (ref >60)
GFR calc non Af Amer: 10 mL/min — ABNORMAL LOW (ref >60)
Glucose, Bld: 131 mg/dL — ABNORMAL HIGH (ref 70–99)
Phosphorus: 4.8 mg/dL — ABNORMAL HIGH (ref 2.5–4.6)
Potassium: 3.9 mmol/L (ref 3.5–5.1)
Sodium: 138 mmol/L (ref 135–145)

## 2019-10-10 LAB — PROTEIN / CREATININE RATIO, URINE
Creatinine, Urine: 59.63 mg/dL
Protein Creatinine Ratio: 1.16 mg/mg{Cre} — ABNORMAL HIGH (ref 0.00–0.15)
Total Protein, Urine: 69 mg/dL

## 2019-10-10 LAB — VITAMIN D 25 HYDROXY (VIT D DEFICIENCY, FRACTURES): Vit D, 25-Hydroxy: 100.67 ng/mL — ABNORMAL HIGH (ref 30–100)

## 2019-10-10 LAB — POCT HEMOGLOBIN-HEMACUE: Hemoglobin: 9.6 g/dL — ABNORMAL LOW (ref 13.0–17.0)

## 2019-10-10 MED ORDER — DARBEPOETIN ALFA 60 MCG/0.3ML IJ SOSY
60.0000 ug | PREFILLED_SYRINGE | Freq: Once | INTRAMUSCULAR | Status: AC
  Administered 2019-10-10: 60 ug via SUBCUTANEOUS

## 2019-10-10 MED ORDER — DARBEPOETIN ALFA 60 MCG/0.3ML IJ SOSY
PREFILLED_SYRINGE | INTRAMUSCULAR | Status: AC
  Filled 2019-10-10: qty 0.3

## 2019-10-11 LAB — PARATHYROID HORMONE, INTACT (NO CA): PTH: 55 pg/mL (ref 15–65)

## 2019-10-17 ENCOUNTER — Encounter (HOSPITAL_COMMUNITY): Payer: Self-pay

## 2019-10-17 ENCOUNTER — Other Ambulatory Visit: Payer: Self-pay

## 2019-10-17 ENCOUNTER — Encounter (HOSPITAL_COMMUNITY)
Admission: RE | Admit: 2019-10-17 | Discharge: 2019-10-17 | Disposition: A | Discharge status: Home / Self Care | Payer: Medicare Other | Source: Ambulatory Visit | Attending: Nephrology | Admitting: Nephrology

## 2019-10-17 DIAGNOSIS — D631 Anemia in chronic kidney disease: Secondary | ICD-10-CM | POA: Diagnosis present

## 2019-10-17 DIAGNOSIS — N185 Chronic kidney disease, stage 5: Secondary | ICD-10-CM | POA: Diagnosis present

## 2019-10-17 LAB — POCT HEMOGLOBIN-HEMACUE: Hemoglobin: 9.2 g/dL — ABNORMAL LOW (ref 13.0–17.0)

## 2019-10-17 MED ORDER — DARBEPOETIN ALFA 60 MCG/0.3ML IJ SOSY
PREFILLED_SYRINGE | INTRAMUSCULAR | Status: AC
  Filled 2019-10-17: qty 0.3

## 2019-10-17 MED ORDER — DARBEPOETIN ALFA 60 MCG/0.3ML IJ SOSY
60.0000 ug | PREFILLED_SYRINGE | Freq: Once | INTRAMUSCULAR | Status: AC
  Administered 2019-10-17: 60 ug via SUBCUTANEOUS

## 2019-10-24 ENCOUNTER — Other Ambulatory Visit: Payer: Self-pay

## 2019-10-24 ENCOUNTER — Encounter (HOSPITAL_COMMUNITY)
Admission: RE | Admit: 2019-10-24 | Discharge: 2019-10-24 | Disposition: A | Discharge status: Home / Self Care | Payer: Medicare Other | Source: Ambulatory Visit | Attending: Nephrology | Admitting: Nephrology

## 2019-10-24 ENCOUNTER — Encounter (HOSPITAL_COMMUNITY): Payer: Self-pay

## 2019-10-24 DIAGNOSIS — N185 Chronic kidney disease, stage 5: Secondary | ICD-10-CM | POA: Diagnosis not present

## 2019-10-24 LAB — POCT HEMOGLOBIN-HEMACUE: Hemoglobin: 9.8 g/dL — ABNORMAL LOW (ref 13.0–17.0)

## 2019-10-24 MED ORDER — DARBEPOETIN ALFA 60 MCG/0.3ML IJ SOSY
60.0000 ug | PREFILLED_SYRINGE | Freq: Once | INTRAMUSCULAR | Status: AC
  Administered 2019-10-24: 60 ug via SUBCUTANEOUS
  Filled 2019-10-24: qty 0.3

## 2019-10-31 ENCOUNTER — Encounter (HOSPITAL_COMMUNITY): Payer: Self-pay

## 2019-10-31 ENCOUNTER — Other Ambulatory Visit: Payer: Self-pay

## 2019-10-31 ENCOUNTER — Encounter (HOSPITAL_COMMUNITY)
Admission: RE | Admit: 2019-10-31 | Discharge: 2019-10-31 | Disposition: A | Discharge status: Home / Self Care | Payer: Medicare Other | Source: Ambulatory Visit | Attending: Nephrology | Admitting: Nephrology

## 2019-10-31 DIAGNOSIS — N185 Chronic kidney disease, stage 5: Secondary | ICD-10-CM | POA: Diagnosis not present

## 2019-10-31 LAB — POCT HEMOGLOBIN-HEMACUE: Hemoglobin: 10.2 g/dL — ABNORMAL LOW (ref 13.0–17.0)

## 2019-10-31 MED ORDER — DARBEPOETIN ALFA 60 MCG/0.3ML IJ SOSY: 60.0000 ug | PREFILLED_SYRINGE | INTRAMUSCULAR | Status: DC

## 2019-11-08 ENCOUNTER — Encounter (HOSPITAL_COMMUNITY): Admission: RE | Admit: 2019-11-08 | Payer: Medicare Other | Source: Ambulatory Visit

## 2019-11-15 ENCOUNTER — Encounter (HOSPITAL_COMMUNITY): Payer: Self-pay

## 2019-11-15 ENCOUNTER — Other Ambulatory Visit: Payer: Self-pay

## 2019-11-15 ENCOUNTER — Encounter (HOSPITAL_COMMUNITY)
Admission: RE | Admit: 2019-11-15 | Discharge: 2019-11-15 | Disposition: A | Discharge status: Home / Self Care | Payer: Medicare Other | Source: Ambulatory Visit | Attending: Nephrology | Admitting: Nephrology

## 2019-11-15 DIAGNOSIS — D631 Anemia in chronic kidney disease: Secondary | ICD-10-CM | POA: Diagnosis not present

## 2019-11-15 DIAGNOSIS — N185 Chronic kidney disease, stage 5: Secondary | ICD-10-CM | POA: Diagnosis present

## 2019-11-15 LAB — IRON AND TIBC
Iron: 81 ug/dL (ref 45–182)
Saturation Ratios: 33 % (ref 17.9–39.5)
TIBC: 247 ug/dL — ABNORMAL LOW (ref 250–450)
UIBC: 166 ug/dL

## 2019-11-15 LAB — CBC
HCT: 28.7 % — ABNORMAL LOW (ref 39.0–52.0)
Hemoglobin: 9.6 g/dL — ABNORMAL LOW (ref 13.0–17.0)
MCH: 32.8 pg (ref 26.0–34.0)
MCHC: 33.4 g/dL (ref 30.0–36.0)
MCV: 98 fL (ref 80.0–100.0)
Platelets: 111 10*3/uL — ABNORMAL LOW (ref 150–400)
RBC: 2.93 MIL/uL — ABNORMAL LOW (ref 4.22–5.81)
RDW: 13.9 % (ref 11.5–15.5)
WBC: 5.9 10*3/uL (ref 4.0–10.5)
nRBC: 0 % (ref 0.0–0.2)

## 2019-11-15 LAB — RENAL FUNCTION PANEL
Albumin: 3.5 g/dL (ref 3.5–5.0)
Anion gap: 15 (ref 5–15)
BUN: 110 mg/dL — ABNORMAL HIGH (ref 8–23)
CO2: 24 mmol/L (ref 22–32)
Calcium: 9 mg/dL (ref 8.9–10.3)
Chloride: 100 mmol/L (ref 98–111)
Creatinine, Ser: 4.94 mg/dL — ABNORMAL HIGH (ref 0.61–1.24)
GFR calc Af Amer: 12 mL/min — ABNORMAL LOW (ref >60)
GFR calc non Af Amer: 10 mL/min — ABNORMAL LOW (ref >60)
Glucose, Bld: 133 mg/dL — ABNORMAL HIGH (ref 70–99)
Phosphorus: 4.5 mg/dL (ref 2.5–4.6)
Potassium: 4.2 mmol/L (ref 3.5–5.1)
Sodium: 139 mmol/L (ref 135–145)

## 2019-11-15 LAB — FERRITIN: Ferritin: 258 ng/mL (ref 24–336)

## 2019-11-15 LAB — PROTEIN / CREATININE RATIO, URINE
Creatinine, Urine: 39.88 mg/dL
Protein Creatinine Ratio: 1.73 mg/mg{Cre} — ABNORMAL HIGH (ref 0.00–0.15)
Total Protein, Urine: 69 mg/dL

## 2019-11-15 LAB — VITAMIN D 25 HYDROXY (VIT D DEFICIENCY, FRACTURES): Vit D, 25-Hydroxy: 86.83 ng/mL (ref 30–100)

## 2019-11-15 LAB — POCT HEMOGLOBIN-HEMACUE: Hemoglobin: 9.4 g/dL — ABNORMAL LOW (ref 13.0–17.0)

## 2019-11-15 MED ORDER — DARBEPOETIN ALFA 60 MCG/0.3ML IJ SOSY
60.0000 ug | PREFILLED_SYRINGE | Freq: Once | INTRAMUSCULAR | Status: AC
  Administered 2019-11-15: 10:00:00 60 ug via SUBCUTANEOUS
  Filled 2019-11-15: qty 0.3

## 2019-11-16 LAB — PARATHYROID HORMONE, INTACT (NO CA): PTH: 80 pg/mL — ABNORMAL HIGH (ref 15–65)

## 2019-11-22 ENCOUNTER — Encounter (HOSPITAL_COMMUNITY)
Admission: RE | Admit: 2019-11-22 | Discharge: 2019-11-22 | Disposition: A | Discharge status: Home / Self Care | Payer: Medicare Other | Source: Ambulatory Visit | Attending: Nephrology | Admitting: Nephrology

## 2019-11-22 ENCOUNTER — Other Ambulatory Visit: Payer: Self-pay

## 2019-11-22 ENCOUNTER — Encounter (HOSPITAL_COMMUNITY): Payer: Self-pay

## 2019-11-22 DIAGNOSIS — N185 Chronic kidney disease, stage 5: Secondary | ICD-10-CM | POA: Diagnosis not present

## 2019-11-22 LAB — POCT HEMOGLOBIN-HEMACUE: Hemoglobin: 8.9 g/dL — ABNORMAL LOW (ref 13.0–17.0)

## 2019-11-22 MED ORDER — DARBEPOETIN ALFA 60 MCG/0.3ML IJ SOSY
60.0000 ug | PREFILLED_SYRINGE | Freq: Once | INTRAMUSCULAR | Status: AC
  Administered 2019-11-22: 10:00:00 60 ug via SUBCUTANEOUS
  Filled 2019-11-22: qty 0.3

## 2019-11-29 ENCOUNTER — Encounter (HOSPITAL_COMMUNITY): Payer: Medicare Other

## 2019-11-29 ENCOUNTER — Encounter (HOSPITAL_COMMUNITY): Admission: RE | Admit: 2019-11-29 | Payer: Medicare Other | Source: Ambulatory Visit

## 2019-12-04 ENCOUNTER — Encounter (HOSPITAL_COMMUNITY)
Admission: RE | Admit: 2019-12-04 | Discharge: 2019-12-04 | Disposition: A | Discharge status: Home / Self Care | Payer: Medicare Other | Source: Ambulatory Visit | Attending: Nephrology | Admitting: Nephrology

## 2019-12-04 ENCOUNTER — Other Ambulatory Visit: Payer: Self-pay

## 2019-12-04 ENCOUNTER — Encounter (HOSPITAL_COMMUNITY): Payer: Self-pay

## 2019-12-04 DIAGNOSIS — N185 Chronic kidney disease, stage 5: Secondary | ICD-10-CM | POA: Diagnosis not present

## 2019-12-04 LAB — POCT HEMOGLOBIN-HEMACUE: Hemoglobin: 9.2 g/dL — ABNORMAL LOW (ref 13.0–17.0)

## 2019-12-04 MED ORDER — DARBEPOETIN ALFA 60 MCG/0.3ML IJ SOSY
60.0000 ug | PREFILLED_SYRINGE | Freq: Once | INTRAMUSCULAR | Status: AC
  Administered 2019-12-04: 14:00:00 60 ug via SUBCUTANEOUS
  Filled 2019-12-04: qty 0.3

## 2019-12-13 ENCOUNTER — Encounter (HOSPITAL_COMMUNITY)
Admission: RE | Admit: 2019-12-13 | Discharge: 2019-12-13 | Disposition: A | Discharge status: Home / Self Care | Payer: Medicare Other | Source: Ambulatory Visit | Attending: Nephrology | Admitting: Nephrology

## 2019-12-13 ENCOUNTER — Encounter (HOSPITAL_COMMUNITY): Payer: Self-pay

## 2019-12-13 ENCOUNTER — Other Ambulatory Visit: Payer: Self-pay

## 2019-12-13 DIAGNOSIS — N185 Chronic kidney disease, stage 5: Secondary | ICD-10-CM | POA: Diagnosis present

## 2019-12-13 DIAGNOSIS — D631 Anemia in chronic kidney disease: Secondary | ICD-10-CM | POA: Insufficient documentation

## 2019-12-13 LAB — POCT HEMOGLOBIN-HEMACUE: Hemoglobin: 9.3 g/dL — ABNORMAL LOW (ref 13.0–17.0)

## 2019-12-13 MED ORDER — DARBEPOETIN ALFA 60 MCG/0.3ML IJ SOSY
60.0000 ug | PREFILLED_SYRINGE | Freq: Once | INTRAMUSCULAR | Status: AC
  Administered 2019-12-13: 60 ug via SUBCUTANEOUS
  Filled 2019-12-13: qty 0.3

## 2019-12-20 ENCOUNTER — Encounter (HOSPITAL_COMMUNITY)
Admission: RE | Admit: 2019-12-20 | Discharge: 2019-12-20 | Disposition: A | Discharge status: Home / Self Care | Payer: Medicare Other | Source: Ambulatory Visit | Attending: Nephrology | Admitting: Nephrology

## 2019-12-20 ENCOUNTER — Encounter (HOSPITAL_COMMUNITY): Payer: Self-pay

## 2019-12-20 ENCOUNTER — Other Ambulatory Visit: Payer: Self-pay

## 2019-12-20 DIAGNOSIS — N185 Chronic kidney disease, stage 5: Secondary | ICD-10-CM | POA: Diagnosis not present

## 2019-12-20 LAB — PROTEIN / CREATININE RATIO, URINE
Creatinine, Urine: 64.67 mg/dL
Protein Creatinine Ratio: 1.11 mg/mg{Cre} — ABNORMAL HIGH (ref 0.00–0.15)
Total Protein, Urine: 72 mg/dL

## 2019-12-20 LAB — RENAL FUNCTION PANEL
Albumin: 3.4 g/dL — ABNORMAL LOW (ref 3.5–5.0)
Anion gap: 12 (ref 5–15)
BUN: 130 mg/dL — ABNORMAL HIGH (ref 8–23)
CO2: 24 mmol/L (ref 22–32)
Calcium: 8.6 mg/dL — ABNORMAL LOW (ref 8.9–10.3)
Chloride: 100 mmol/L (ref 98–111)
Creatinine, Ser: 5.27 mg/dL — ABNORMAL HIGH (ref 0.61–1.24)
GFR calc Af Amer: 11 mL/min — ABNORMAL LOW (ref >60)
GFR calc non Af Amer: 9 mL/min — ABNORMAL LOW (ref >60)
Glucose, Bld: 170 mg/dL — ABNORMAL HIGH (ref 70–99)
Phosphorus: 5.8 mg/dL — ABNORMAL HIGH (ref 2.5–4.6)
Potassium: 3.4 mmol/L — ABNORMAL LOW (ref 3.5–5.1)
Sodium: 136 mmol/L (ref 135–145)

## 2019-12-20 LAB — CBC
HCT: 29.9 % — ABNORMAL LOW (ref 39.0–52.0)
Hemoglobin: 9.7 g/dL — ABNORMAL LOW (ref 13.0–17.0)
MCH: 31.7 pg (ref 26.0–34.0)
MCHC: 32.4 g/dL (ref 30.0–36.0)
MCV: 97.7 fL (ref 80.0–100.0)
Platelets: 107 10*3/uL — ABNORMAL LOW (ref 150–400)
RBC: 3.06 MIL/uL — ABNORMAL LOW (ref 4.22–5.81)
RDW: 14.2 % (ref 11.5–15.5)
WBC: 6.8 10*3/uL (ref 4.0–10.5)
nRBC: 0 % (ref 0.0–0.2)

## 2019-12-20 LAB — POCT HEMOGLOBIN-HEMACUE: Hemoglobin: 9.6 g/dL — ABNORMAL LOW (ref 13.0–17.0)

## 2019-12-20 LAB — VITAMIN D 25 HYDROXY (VIT D DEFICIENCY, FRACTURES): Vit D, 25-Hydroxy: 64.34 ng/mL (ref 30–100)

## 2019-12-20 LAB — IRON AND TIBC
Iron: 43 ug/dL — ABNORMAL LOW (ref 45–182)
Saturation Ratios: 17 % — ABNORMAL LOW (ref 17.9–39.5)
TIBC: 248 ug/dL — ABNORMAL LOW (ref 250–450)
UIBC: 205 ug/dL

## 2019-12-20 LAB — FERRITIN: Ferritin: 105 ng/mL (ref 24–336)

## 2019-12-20 MED ORDER — DARBEPOETIN ALFA 60 MCG/0.3ML IJ SOSY
60.0000 ug | PREFILLED_SYRINGE | Freq: Once | INTRAMUSCULAR | Status: AC
  Administered 2019-12-20: 60 ug via SUBCUTANEOUS
  Filled 2019-12-20: qty 0.3

## 2019-12-21 LAB — PARATHYROID HORMONE, INTACT (NO CA): PTH: 85 pg/mL — ABNORMAL HIGH (ref 15–65)

## 2019-12-27 ENCOUNTER — Other Ambulatory Visit: Payer: Self-pay

## 2019-12-27 ENCOUNTER — Other Ambulatory Visit: Payer: Self-pay | Admitting: *Deleted

## 2019-12-27 ENCOUNTER — Encounter (HOSPITAL_COMMUNITY)
Admission: RE | Admit: 2019-12-27 | Discharge: 2019-12-27 | Disposition: A | Discharge status: Home / Self Care | Payer: Medicare Other | Source: Ambulatory Visit | Attending: Nephrology | Admitting: Nephrology

## 2019-12-27 ENCOUNTER — Telehealth (HOSPITAL_COMMUNITY): Payer: Self-pay

## 2019-12-27 ENCOUNTER — Encounter (HOSPITAL_COMMUNITY): Payer: Self-pay

## 2019-12-27 DIAGNOSIS — N186 End stage renal disease: Secondary | ICD-10-CM

## 2019-12-27 DIAGNOSIS — N185 Chronic kidney disease, stage 5: Secondary | ICD-10-CM | POA: Diagnosis not present

## 2019-12-27 LAB — POCT HEMOGLOBIN-HEMACUE: Hemoglobin: 9 g/dL — ABNORMAL LOW (ref 13.0–17.0)

## 2019-12-27 MED ORDER — DARBEPOETIN ALFA 60 MCG/0.3ML IJ SOSY
PREFILLED_SYRINGE | INTRAMUSCULAR | Status: AC
  Filled 2019-12-27: qty 0.3

## 2019-12-27 MED ORDER — DARBEPOETIN ALFA 60 MCG/0.3ML IJ SOSY
60.0000 ug | PREFILLED_SYRINGE | Freq: Once | INTRAMUSCULAR | Status: AC
  Administered 2019-12-27: 60 ug via SUBCUTANEOUS

## 2019-12-27 NOTE — Telephone Encounter (Signed)

## 2019-12-28 ENCOUNTER — Encounter: Payer: Self-pay | Admitting: Vascular Surgery

## 2019-12-28 ENCOUNTER — Ambulatory Visit (HOSPITAL_COMMUNITY)
Admission: RE | Admit: 2019-12-28 | Discharge: 2019-12-28 | Disposition: A | Discharge status: Home / Self Care | Payer: Medicare Other | Source: Ambulatory Visit | Attending: Vascular Surgery | Admitting: Vascular Surgery

## 2019-12-28 ENCOUNTER — Other Ambulatory Visit: Payer: Self-pay

## 2019-12-28 ENCOUNTER — Inpatient Hospital Stay (HOSPITAL_COMMUNITY): Admission: RE | Admit: 2019-12-28 | Payer: Medicare Other | Source: Ambulatory Visit

## 2019-12-28 ENCOUNTER — Ambulatory Visit (INDEPENDENT_AMBULATORY_CARE_PROVIDER_SITE_OTHER): Payer: Medicare Other | Admitting: Vascular Surgery

## 2019-12-28 ENCOUNTER — Ambulatory Visit (INDEPENDENT_AMBULATORY_CARE_PROVIDER_SITE_OTHER)
Admission: RE | Admit: 2019-12-28 | Discharge: 2019-12-28 | Disposition: A | Discharge status: Home / Self Care | Payer: Medicare Other | Source: Ambulatory Visit | Attending: Vascular Surgery | Admitting: Vascular Surgery

## 2019-12-28 ENCOUNTER — Encounter: Payer: Self-pay | Admitting: *Deleted

## 2019-12-28 VITALS — BP 156/75 | HR 76 | Temp 97.3°F | Resp 20 | Ht 69.0 in | Wt 183.0 lb

## 2019-12-28 DIAGNOSIS — N184 Chronic kidney disease, stage 4 (severe): Secondary | ICD-10-CM

## 2019-12-28 DIAGNOSIS — I739 Peripheral vascular disease, unspecified: Secondary | ICD-10-CM | POA: Diagnosis not present

## 2019-12-28 DIAGNOSIS — N186 End stage renal disease: Secondary | ICD-10-CM | POA: Diagnosis present

## 2019-12-28 NOTE — Progress Notes (Signed)
Patient ID: Terry Hanover., male   DOB: Feb 22, 1935, 85 y.o.   MRN: [id number]  Reason for Consult: New Patient (Initial Visit)   Referred by [name], MD  Subjective:     HPI:  Terry Mcmannis. is a 85 y.o. male history of chronic kidney disease stage V.  This is secondary to diabetes hypertension.  Recently was started on Xarelto for atrial fibrillation.  He does walk with the help of a walker.  He has never had dialysis.  Is never had chest breast or upper extremity surgery.  He is right-hand dominant.  Past Medical History:  Diagnosis Date  . Anemia   . Chronic kidney disease    Stage 5  . Chronic kidney disease-mineral and bone disorder   . Hyperlipidemia   . Hypertension   . Proteinuria   . Type 2 diabetes mellitus with stage 5 chronic kidney disease (HCC)    Family History  Problem Relation Age of Onset  . Lung cancer Father    Past Surgical History:  Procedure Laterality Date  . COLONOSCOPY    . ESOPHAGOGASTRODUODENOSCOPY N/A 04/25/2019   Procedure: ESOPHAGOGASTRODUODENOSCOPY (EGD);  Surgeon: [name], MD;  Location: [facility];  Service: Endoscopy;  Laterality: N/A;  . HIP ARTHROSCOPY Left     Short Social History:  Social History   Tobacco Use  . Smoking status: Current Every Day Smoker    Packs/day: 0.25    Types: Cigars  . Smokeless tobacco: Never Used  Substance Use Topics  . Alcohol use: Yes    Comment: rarely    Allergies  Allergen Reactions  . Quinine   . Penicillins Rash    Did it involve swelling of the face/tongue/throat, SOB, or low BP? No Did it involve sudden or severe rash/hives, skin peeling, or any reaction on the inside of your mouth or nose? Yes Did you need to seek medical attention at a hospital or doctor's office? Unknown When did it last happen?60 years If all above answers are "NO", may proceed with cephalosporin use.    Current Outpatient Medications  Medication Sig Dispense Refill  .  atorvastatin (LIPITOR) 40 MG tablet Take 40 mg by mouth every evening.     . carvedilol (COREG) 12.5 MG tablet Take 12.5 mg by mouth 2 (two) times daily.    . Darbepoetin Alfa (ARANESP) 60 MCG/0.3ML SOSY injection Inject 60 mcg into the skin every 14 (fourteen) days.    Marland Kitchen levothyroxine (SYNTHROID) 25 MCG tablet Take 25 mcg by mouth daily before breakfast.    . nitroGLYCERIN (NITROSTAT) 0.4 MG SL tablet Place 0.4 mg under the tongue every 5 (five) minutes as needed for chest pain.    . pantoprazole (PROTONIX) 40 MG tablet Take 40 mg by mouth every morning.     . sodium bicarbonate 650 MG tablet Take 1 tablet by mouth 2 (two) times a week.     . terazosin (HYTRIN) 1 MG capsule Take 1 mg by mouth at bedtime.    . torsemide (DEMADEX) 20 MG tablet Take 20 mg by mouth daily.    Alveda Reasons 15 MG TABS tablet Take 15 mg by mouth at bedtime.     No current facility-administered medications for this visit.    Review of Systems  Constitutional:  Constitutional negative. HENT: HENT negative.  Eyes: Eyes negative.  Respiratory: Positive for shortness of breath.  Cardiovascular: Positive for leg swelling.  GI: Gastrointestinal negative.  Musculoskeletal: Positive for gait  problem and leg pain.  Hematologic: Hematologic/lymphatic negative.  Psychiatric: Psychiatric negative.        Objective:  Objective   Vitals:   12/28/19 1022  BP: (!) 156/75  Pulse: 76  Resp: 20  Temp: (!) 97.3 F (36.3 C)  SpO2: 100%  Weight: 183 lb (83 kg)  Height: 5\' 9"  (1.753 m)   Body mass index is 27.02 kg/m.  Physical Exam HENT:     Head: Normocephalic.     Nose:     Comments: Mask in place Eyes:     Pupils: Pupils are equal, round, and reactive to light.  Cardiovascular:     Comments: 2+ palpable brachial pulses Pulmonary:     Effort: Pulmonary effort is normal.  Abdominal:     General: Abdomen is flat.  Musculoskeletal:        General: Swelling present.     Right lower leg: Edema present.      Left lower leg: Edema present.  Neurological:     General: No focal deficit present.     Mental Status: He is alert.  Psychiatric:        Mood and Affect: Mood normal.        Behavior: Behavior normal.        Judgment: Judgment normal.     Data: I independently interpreted his bilateral extremity vein mapping.  Appears to have marginal basilic vein bilaterally cephalic veins are probably not suitable bilaterally.  I have independently interpreted his bilateral upper extremity duplexes.  Right side brachial artery triphasic 0.59 cm and left side 0.54 cm and triphasic.     Assessment/Plan:     85 year old male presents for evaluation for dialysis access creation.  He is scheduled to begin dialysis next Thursday.  He will need to hold Xarelto for 2 days prior to surgery.  We will set him up for left arm fistula versus graft as well as tunneled dialysis catheter placement early next week.  He will then follow-up in 4 weeks with ABIs to evaluate his lower extremities.  He does have some ulceration of the right lower extremity hopefully this will heal if edema can improve after initiating dialysis.  I discussed the risk benefits alternatives with the patient as well as with his daughter via telephone including steal, Imn and primary nonfunction.  They demonstrate good understanding.     [name] [facility]

## 2019-12-28 NOTE — H&P (View-Only) (Signed)
Patient ID: Terry Thor., male   DOB: Dec 02, 1934, 85 y.o.   MRN: [id number]  Reason for Consult: New Patient (Initial Visit)   Referred by Terry Scarce, MD  Subjective:     HPI:  [name] is a 85 y.o. male history of chronic kidney disease stage V.  This is secondary to diabetes hypertension.  Recently was started on Xarelto for atrial fibrillation.  He does walk with the help of a walker.  He has never had dialysis.  Is never had chest breast or upper extremity surgery.  He is right-hand dominant.  Past Medical History:  Diagnosis Date  . Anemia   . Chronic kidney disease    Stage 5  . Chronic kidney disease-mineral and bone disorder   . Hyperlipidemia   . Hypertension   . Proteinuria   . Type 2 diabetes mellitus with stage 5 chronic kidney disease (HCC)    Family History  Problem Relation Age of Onset  . Lung cancer Father    Past Surgical History:  Procedure Laterality Date  . COLONOSCOPY    . ESOPHAGOGASTRODUODENOSCOPY N/A 04/25/2019   Procedure: ESOPHAGOGASTRODUODENOSCOPY (EGD);  Surgeon: [name], MD;  [facility];  Service: Endoscopy;  Laterality: N/A;  . HIP ARTHROSCOPY Left     Short Social History:  Social History   Tobacco Use  . Smoking status: Current Every Day Smoker    Packs/day: 0.25    Types: Cigars  . Smokeless tobacco: Never Used  Substance Use Topics  . Alcohol use: Yes    Comment: rarely    Allergies  Allergen Reactions  . Quinine   . Penicillins Rash    Did it involve swelling of the face/tongue/throat, SOB, or low BP? No Did it involve sudden or severe rash/hives, skin peeling, or any reaction on the inside of your mouth or nose? Yes Did you need to seek medical attention at a hospital or doctor's office? Unknown When did it last happen?60 years If all above answers are "NO", may proceed with cephalosporin use.    Current Outpatient Medications  Medication Sig Dispense Refill  .  atorvastatin (LIPITOR) 40 MG tablet Take 40 mg by mouth every evening.     . carvedilol (COREG) 12.5 MG tablet Take 12.5 mg by mouth 2 (two) times daily.    . Darbepoetin Alfa (ARANESP) 60 MCG/0.3ML SOSY injection Inject 60 mcg into the skin every 14 (fourteen) days.    Marland Kitchen levothyroxine (SYNTHROID) 25 MCG tablet Take 25 mcg by mouth daily before breakfast.    . nitroGLYCERIN (NITROSTAT) 0.4 MG SL tablet Place 0.4 mg under the tongue every 5 (five) minutes as needed for chest pain.    . pantoprazole (PROTONIX) 40 MG tablet Take 40 mg by mouth every morning.     . sodium bicarbonate 650 MG tablet Take 1 tablet by mouth 2 (two) times a week.     . terazosin (HYTRIN) 1 MG capsule Take 1 mg by mouth at bedtime.    . torsemide (DEMADEX) 20 MG tablet Take 20 mg by mouth daily.    Alveda Reasons 15 MG TABS tablet Take 15 mg by mouth at bedtime.     No current facility-administered medications for this visit.    Review of Systems  Constitutional:  Constitutional negative. HENT: HENT negative.  Eyes: Eyes negative.  Respiratory: Positive for shortness of breath.  Cardiovascular: Positive for leg swelling.  GI: Gastrointestinal negative.  Musculoskeletal: Positive for gait  problem and leg pain.  Hematologic: Hematologic/lymphatic negative.  Psychiatric: Psychiatric negative.        Objective:  Objective   Vitals:   12/28/19 1022  BP: (!) 156/75  Pulse: 76  Resp: 20  Temp: (!) 97.3 F (36.3 C)  SpO2: 100%  Weight: 183 lb (83 kg)  Height: 5\' 9"  (1.753 m)   Body mass index is 27.02 kg/m.  Physical Exam HENT:     Head: Normocephalic.     Nose:     Comments: Mask in place Eyes:     Pupils: Pupils are equal, round, and reactive to light.  Cardiovascular:     Comments: 2+ palpable brachial pulses Pulmonary:     Effort: Pulmonary effort is normal.  Abdominal:     General: Abdomen is flat.  Musculoskeletal:        General: Swelling present.     Right lower leg: Edema present.      Left lower leg: Edema present.  Neurological:     General: No focal deficit present.     Mental Status: He is alert.  Psychiatric:        Mood and Affect: Mood normal.        Behavior: Behavior normal.        Judgment: Judgment normal.     Data: I independently interpreted his bilateral extremity vein mapping.  Appears to have marginal basilic vein bilaterally cephalic veins are probably not suitable bilaterally.  I have independently interpreted his bilateral upper extremity duplexes.  Right side brachial artery triphasic 0.59 cm and left side 0.54 cm and triphasic.     Assessment/Plan:     85 year old male presents for evaluation for dialysis access creation.  He is scheduled to begin dialysis next Thursday.  He will need to hold Xarelto for 2 days prior to surgery.  We will set him up for left arm fistula versus graft as well as tunneled dialysis catheter placement early next week.  He will then follow-up in 4 weeks with ABIs to evaluate his lower extremities.  He does have some ulceration of the right lower extremity hopefully this will heal if edema can improve after initiating dialysis.  I discussed the risk benefits alternatives with the patient as well as with his daughter via telephone including steal, Imn and primary nonfunction.  They demonstrate good understanding.     Terry Sandy MD [facility]

## 2019-12-31 ENCOUNTER — Other Ambulatory Visit: Payer: Self-pay

## 2019-12-31 ENCOUNTER — Encounter (HOSPITAL_COMMUNITY): Payer: Self-pay | Admitting: Vascular Surgery

## 2019-12-31 ENCOUNTER — Other Ambulatory Visit: Payer: Self-pay | Admitting: *Deleted

## 2019-12-31 ENCOUNTER — Other Ambulatory Visit (HOSPITAL_COMMUNITY)
Admission: RE | Admit: 2019-12-31 | Discharge: 2019-12-31 | Disposition: A | Discharge status: Home / Self Care | Payer: Medicare Other | Source: Ambulatory Visit | Attending: Vascular Surgery | Admitting: Vascular Surgery

## 2019-12-31 DIAGNOSIS — Z01812 Encounter for preprocedural laboratory examination: Secondary | ICD-10-CM | POA: Diagnosis present

## 2019-12-31 DIAGNOSIS — Z20822 Contact with and (suspected) exposure to covid-19: Secondary | ICD-10-CM | POA: Insufficient documentation

## 2019-12-31 DIAGNOSIS — I739 Peripheral vascular disease, unspecified: Secondary | ICD-10-CM

## 2019-12-31 LAB — SARS CORONAVIRUS 2 (TAT 6-24 HRS): SARS Coronavirus 2: NEGATIVE

## 2019-12-31 MED ORDER — ATROPINE SULFATE 1 MG/10ML IJ SOSY
PREFILLED_SYRINGE | INTRAMUSCULAR | Status: AC
  Filled 2019-12-31: qty 10

## 2019-12-31 MED ORDER — ATROPINE SULFATE 1 MG/ML IJ SOLN
INTRAMUSCULAR | Status: AC
  Filled 2019-12-31: qty 1

## 2019-12-31 NOTE — Progress Notes (Addendum)
Anesthesia Chart Review: Terry Graves    Case: [id number] Date/Time: 01/01/20 1323   Procedures:      LEFT ARM ARTERIOVENOUS (AV) FISTULA CREATION VERSES ARTERIOVENOUS GRAFT (Left )     INSERTION OF DIALYSIS CATHETER (N/A )   Anesthesia type: Monitor Anesthesia Care   Pre-op diagnosis: END STAGE RENAL DISEASE   Location: [facility] OR ROOM 10 / [place] OR   Surgeons: Waynetta Sandy, MD      DISCUSSION: Patient is an 85 year old male scheduled for the above procedure.  History includes smoking, CKD stage V, HTN, DM2, HLD, afib, CHF, anemia (s/p 3 Units PRBC 04/2019, EGD: gastritis, nonbleeding gastric ulcer), falls (questionable nondisplaced impacted fracture involving the superior base of the left femoral neck 04/10/19 CT, [facility]; no hip fracture noted 04/26/19 xray).  He was referred to vascular surgery for hemodialysis access following 12/24/19 routine follow-up with nephrologist Dr. Fabiola Backer has known CKD stage V but was having increasing LE edema unresponsive to diuretics.   Admission to [facility] 04/24/19-04/27/19 for anemia, s/p 3 Units PRBC. 04/25/19 EGD showed gastritis, non-bleeding gastric ulcer. EKG showed afib with RVR, metoprolol added. Echo showed LVEF 35-40%, moderate-severe TR, severe pulmonary hypertension with PASP 92 mmHg, probable small PFO with left-to-right shunt. No specific cardiology follow-up outlined in Discharge Summary, but nephrology notes do indicate that he is followed by cardiologist Dr. Alroy Dust, so records have been requested.   He is for ISTAT labs on arrival. HGB 9.0 on 12/27/19. Dr. Donzetta Matters has asked him to hold Xarelto for 2 days prior to surgery.   ADDENDUM 01/01/20 9:09 AM: Last Xarelto 12/27/19. 12/31/19 COVID-19 test negative. Cardiology records received this morning. Last visit 11/19/19 with Valorie Roosevelt, NP-C for afib and cardiomyopathy follow-up. He had been started on Eliquis approximately 10 days prior without bleeding issues (although changed from  Eliquis to Xarelto at that visit given his insurance's preferred medication). Medications had been adjusted for CM (metoprolol changed to carvedilol). Notes indicate known OM stenosis (75% 2002) otherwise no significant CAD. Recent adenosine stress test showed lateral wall ischemia, but unchanged from 2016. No chest pain. No plans to re-cath currently as he is nearing need for hemodialysis. Discussed consideration of AICD if LVEF remained depressed on follow-up echo (although EF 55-60% on 12/11/19 follow-up echo). Afib was rate controlled. Edema remained an issue, and worsening CKD felt to be a contributing factor. Two month follow-up planned.      VS:  BP Readings from Last 3 Encounters:  12/28/19 (!) 156/75  12/27/19 126/68  12/20/19 121/64   Pulse Readings from Last 3 Encounters:  12/28/19 76  12/27/19 74  12/20/19 72    PROVIDERS: [name], MD is listed as PCP Ulice Bold, MD is neprhrologist ([facility] Everywhere) Lona Kettle, MD is cardiologist ([place])   LABS: He is for ISTAT labs on arrival. As of 12/24/19 ([facility]), WBC 8.2, H/H 10.7/31.8, PLT 119K, glucose 144, Cr 4.98.    IMAGES: 1V PCXR 04/24/19: FINDINGS: Mild cardiomegaly. Atherosclerosis of thoracic aorta is noted. Both lungs are clear. The visualized skeletal structures are unremarkable. IMPRESSION: No active disease.   EKG: EKG 11/19/19 ([facility]):  Possible atrial fibrillation at 83 bpm Right bundle branch block  EKG 04/26/19:  V-rate 123 bpm Atrial fibrillation with rapid ventricular response Incomplete right bundle branch block Nonspecific ST abnormality Abnormal ECG Confirmed by Asencion Noble [phone]) on 04/29/2019 11:43:11 PM   CV: Echo 12/11/19 ([facility]): Impression: Poorly seen  LV with normal systolic function. The ejection fraction is 55-60%. There is mildly increased left ventricular wall thickness.  Patient is in atrial fibrillation. Normal  right ventricular size. The left atrium is severely dilated.  There is moderate mitral regurgitation. There is moderate tricuspid regurgitation. The pulmonary artery is not well seen. (Comparison echo 04/26/19 in setting of severe anemia: LVEF 35-40%, moderate-severe TR, pulmonary hypertension with severely elevated PASP 92 mmHg, probable small PFO with left-to-right-shunt.)  Nuclear stress tet 11/14/19 ([facility]): Overall: This vasodilator stress test is positive for ischemia.  LVEF 72%. Perfusion imaging: There is a small reversible perfusion abnormality of mild intensity in the lateral wall from the mid-wall to the apex.  The remainder of the ventricle has normal perfusion at rest and with stress. Wall motion: Gated SPECT demonstrates normal myocardial thickening and wall motion.  The LV cavity size is normal at rest and with stress, is unchanged. Impression: Abnormal. - By cardiology notes, results felt stable when compared to 2016 stress test. No chest pain. Plan for continued medical therapy recommended as of 11/19/19, particularly given worsening CKD.  Additional testing outlined in 11/19/19 [facility] note: "DHV, 2002 Cath revealed 75% stenosis in the OM" "07-31-15 Lexiscan: EF 65/lateral wall ischemia 11/14/2019 Adenosine MPI: EF 72%, lateral wall ischemia, unchanged from 2016"     Past Medical History:  Diagnosis Date  . Anemia   . Atrial fibrillation ([place])   . CHF (congestive heart failure) ([place])   . Chronic kidney disease    Stage 5  . Chronic kidney disease-mineral and bone disorder   . Hyperlipidemia   . Hypertension   . Proteinuria   . Type 2 diabetes mellitus with stage 5 chronic kidney disease ([place])     Past Surgical History:  Procedure Laterality Date  . COLONOSCOPY    . ESOPHAGOGASTRODUODENOSCOPY N/A 04/25/2019   Procedure: ESOPHAGOGASTRODUODENOSCOPY (EGD);  Surgeon: [name], MD;  [facility];  Service: Endoscopy;  Laterality: N/A;  . HIP  ARTHROSCOPY Left     MEDICATIONS: No current facility-administered medications for this encounter.   Marland Kitchen acetaminophen (TYLENOL) 650 MG CR tablet  . atorvastatin (LIPITOR) 40 MG tablet  . b complex vitamins tablet  . carvedilol (COREG) 12.5 MG tablet  . levothyroxine (SYNTHROID) 25 MCG tablet  . nitroGLYCERIN (NITROSTAT) 0.4 MG SL tablet  . OVER THE COUNTER MEDICATION  . OVER THE COUNTER MEDICATION  . pantoprazole (PROTONIX) 40 MG tablet  . rOPINIRole (REQUIP) 0.5 MG tablet  . sodium bicarbonate 650 MG tablet  . terazosin (HYTRIN) 1 MG capsule  . torsemide (DEMADEX) 20 MG tablet  . XARELTO 15 MG TABS tablet  . Darbepoetin Alfa (ARANESP) 60 MCG/0.3ML SOSY injection    Myra Gianotti, PA-C Surgical Short Stay/Anesthesiology [facility] Phone [phone] [facility] Phone [phone] 12/31/2019 5:01 PM

## 2019-12-31 NOTE — Progress Notes (Signed)
SDW-pre-op call completed by both pt and pt daughter, Terry Graves, as requested by pt. Pt denies any acute cardiopulmonary issues. Pt stated that he is under the care of Dr. Rosalita Chessman, Cardiology and Dr. Valentina Shaggy, PCP. Pt stated that his last visit with his cardiologist was " last month. "  Pt denies having a cardiac cath but stated that an echo was performed 12/10/19 and a nuclear stress test on 11/14/19; awaiting requested records from Cardiologist. Pt stated that last dose of Xarelto was Thursday, 12/27/19 as requested by surgeon. Pt daughter made aware to have pt stop taking vitamins, fish oil, [employer], Rena Food supplements. and herbal medications. Do not take any NSAIDs ie: Ibuprofen, Advil, Naproxen (Aleve), Motrin, BC and Goody Powder. Daughter made aware to have pt check CBG every 2 hours prior to arrival to hospital on DOS. Daughter made aware to treat a CBG < 70 with4 ounces of apple or cranberry juice, wait 15 minutes after intervention to recheck CBG, if CBG remains < 70, call Short Stay unit to speak with a nurse. Daughter reminded to have pt quarantine. Daughter verbalized understanding of all pre-op instructions. PA, Anesthesiology, asked to review pt cardiac history.

## 2019-12-31 NOTE — Anesthesia Preprocedure Evaluation (Addendum)
Anesthesia Evaluation  Patient identified by MRN, date of birth, ID band Patient awake    Reviewed: Allergy & Precautions, NPO status , Patient's Chart, lab work & pertinent test results  Airway Mallampati: II   Neck ROM: Full    Dental   Pulmonary Current Smoker and Patient abstained from smoking.,    breath sounds clear to auscultation       Cardiovascular hypertension,  Rhythm:Regular Rate:Normal     Neuro/Psych    GI/Hepatic   Endo/Other  diabetes  Renal/GU      Musculoskeletal   Abdominal   Peds  Hematology   Anesthesia Other Findings   Reproductive/Obstetrics                            Anesthesia Physical Anesthesia Plan  ASA: III  Anesthesia Plan: MAC   Post-op Pain Management:    Induction: Intravenous  PONV Risk Score and Plan: Propofol infusion  Airway Management Planned:   Additional Equipment:   Intra-op Plan:   Post-operative Plan: Extubation in OR  Informed Consent: I have reviewed the patients History and Physical, chart, labs and discussed the procedure including the risks, benefits and alternatives for the proposed anesthesia with the patient or authorized representative who has indicated his/her understanding and acceptance.       Plan Discussed with:   Anesthesia Plan Comments: (See PAT note written 12/31/2019 by Myra Gianotti, PA-C. Cardiologist is Dr. Rosalita Chessman at [place]. Last visit 11/19/19.   Echo 12/11/19 ([facility]): Impression: Poorly seen LV with normal systolic function. The ejection fraction is 55-60%. There is mildly increased left ventricular wall thickness.  Patient is in atrial fibrillation. Normal right ventricular size. The left atrium is severely dilated.  There is moderate mitral regurgitation. There is moderate tricuspid regurgitation. The pulmonary artery is not well seen. (Comparison echo 04/26/19 in setting  of severe anemia: LVEF 35-40%, moderate-severe TR, pulmonary hypertension with severely elevated PASP 92 mmHg, probable small PFO with left-to-right-shunt.)  Nuclear stress tet 11/14/19 ([facility]): Overall: This vasodilator stress test is positive for ischemia.  LVEF 72%. Perfusion imaging: There is a small reversible perfusion abnormality of mild intensity in the lateral wall from the mid-wall to the apex.  The remainder of the ventricle has normal perfusion at rest and with stress. Wall motion: Gated SPECT demonstrates normal myocardial thickening and wall motion.  The LV cavity size is normal at rest and with stress, is unchanged. Impression: Abnormal. - By cardiology notes, results felt stable when compared to 2016 stress test. No chest pain. Plan for continued medical therapy recommended as of 11/19/19, particularly given worsening CKD.  Additional testing outlined in 11/19/19 [facility] note: "DHV, 2002 Cath revealed 75% stenosis in the OM" "07-31-15 Lexiscan: EF 65/lateral wall ischemia 11/14/2019 Adenosine MPI: EF 72%, lateral wall ischemia, unchanged from 2016"  )     Anesthesia Quick Evaluation

## 2020-01-01 ENCOUNTER — Ambulatory Visit (HOSPITAL_COMMUNITY): Payer: Medicare Other | Admitting: Vascular Surgery

## 2020-01-01 ENCOUNTER — Other Ambulatory Visit: Payer: Self-pay

## 2020-01-01 ENCOUNTER — Ambulatory Visit (HOSPITAL_COMMUNITY): Payer: Medicare Other

## 2020-01-01 ENCOUNTER — Encounter (HOSPITAL_COMMUNITY)
Admission: RE | Disposition: A | Discharge status: Home / Self Care | Payer: Self-pay | Source: Ambulatory Visit | Attending: Vascular Surgery

## 2020-01-01 ENCOUNTER — Ambulatory Visit (HOSPITAL_COMMUNITY)
Admission: RE | Admit: 2020-01-01 | Discharge: 2020-01-01 | Disposition: A | Discharge status: Home / Self Care | Payer: Medicare Other | Source: Ambulatory Visit | Attending: Vascular Surgery | Admitting: Vascular Surgery

## 2020-01-01 ENCOUNTER — Encounter (HOSPITAL_COMMUNITY): Payer: Self-pay | Admitting: Vascular Surgery

## 2020-01-01 DIAGNOSIS — D649 Anemia, unspecified: Secondary | ICD-10-CM | POA: Insufficient documentation

## 2020-01-01 DIAGNOSIS — E785 Hyperlipidemia, unspecified: Secondary | ICD-10-CM | POA: Diagnosis not present

## 2020-01-01 DIAGNOSIS — Z880 Allergy status to penicillin: Secondary | ICD-10-CM | POA: Insufficient documentation

## 2020-01-01 DIAGNOSIS — I4891 Unspecified atrial fibrillation: Secondary | ICD-10-CM | POA: Insufficient documentation

## 2020-01-01 DIAGNOSIS — Z79899 Other long term (current) drug therapy: Secondary | ICD-10-CM | POA: Insufficient documentation

## 2020-01-01 DIAGNOSIS — N186 End stage renal disease: Secondary | ICD-10-CM | POA: Diagnosis not present

## 2020-01-01 DIAGNOSIS — I120 Hypertensive chronic kidney disease with stage 5 chronic kidney disease or end stage renal disease: Secondary | ICD-10-CM | POA: Insufficient documentation

## 2020-01-01 DIAGNOSIS — Z7901 Long term (current) use of anticoagulants: Secondary | ICD-10-CM | POA: Diagnosis not present

## 2020-01-01 DIAGNOSIS — E1122 Type 2 diabetes mellitus with diabetic chronic kidney disease: Secondary | ICD-10-CM | POA: Insufficient documentation

## 2020-01-01 DIAGNOSIS — Z79890 Hormone replacement therapy (postmenopausal): Secondary | ICD-10-CM | POA: Insufficient documentation

## 2020-01-01 DIAGNOSIS — F17210 Nicotine dependence, cigarettes, uncomplicated: Secondary | ICD-10-CM | POA: Insufficient documentation

## 2020-01-01 DIAGNOSIS — Z419 Encounter for procedure for purposes other than remedying health state, unspecified: Secondary | ICD-10-CM

## 2020-01-01 DIAGNOSIS — N185 Chronic kidney disease, stage 5: Secondary | ICD-10-CM | POA: Diagnosis not present

## 2020-01-01 HISTORY — PX: AV FISTULA PLACEMENT: SHX1204

## 2020-01-01 HISTORY — DX: Headache, unspecified: R51.9

## 2020-01-01 HISTORY — DX: Sleep apnea, unspecified: G47.30

## 2020-01-01 HISTORY — DX: Heart failure, unspecified: I50.9

## 2020-01-01 HISTORY — DX: Peripheral vascular disease, unspecified: I73.9

## 2020-01-01 HISTORY — PX: INSERTION OF DIALYSIS CATHETER: SHX1324

## 2020-01-01 HISTORY — DX: Unspecified atrial fibrillation: I48.91

## 2020-01-01 HISTORY — DX: Presence of spectacles and contact lenses: Z97.3

## 2020-01-01 HISTORY — DX: Hypothyroidism, unspecified: E03.9

## 2020-01-01 HISTORY — DX: Unspecified osteoarthritis, unspecified site: M19.90

## 2020-01-01 LAB — POCT I-STAT, CHEM 8
BUN: 128 mg/dL — ABNORMAL HIGH (ref 8–23)
Calcium, Ion: 1.06 mmol/L — ABNORMAL LOW (ref 1.15–1.40)
Chloride: 103 mmol/L (ref 98–111)
Creatinine, Ser: 5.9 mg/dL — ABNORMAL HIGH (ref 0.61–1.24)
Glucose, Bld: 130 mg/dL — ABNORMAL HIGH (ref 70–99)
HCT: 29 % — ABNORMAL LOW (ref 39.0–52.0)
Hemoglobin: 9.9 g/dL — ABNORMAL LOW (ref 13.0–17.0)
Potassium: 3.8 mmol/L (ref 3.5–5.1)
Sodium: 140 mmol/L (ref 135–145)
TCO2: 28 mmol/L (ref 22–32)

## 2020-01-01 LAB — GLUCOSE, CAPILLARY
Glucose-Capillary: 134 mg/dL — ABNORMAL HIGH (ref 70–99)
Glucose-Capillary: 140 mg/dL — ABNORMAL HIGH (ref 70–99)

## 2020-01-01 SURGERY — ARTERIOVENOUS (AV) FISTULA CREATION
Anesthesia: Monitor Anesthesia Care | Site: Neck | Laterality: Right

## 2020-01-01 MED ORDER — ONDANSETRON HCL 4 MG/2ML IJ SOLN
INTRAMUSCULAR | Status: AC
  Filled 2020-01-01: qty 2

## 2020-01-01 MED ORDER — HEPARIN SODIUM (PORCINE) 1000 UNIT/ML IJ SOLN
INTRAMUSCULAR | Status: AC
  Filled 2020-01-01: qty 1

## 2020-01-01 MED ORDER — LIDOCAINE HCL (PF) 1 % IJ SOLN
INTRAMUSCULAR | Status: DC | PRN
  Administered 2020-01-01: 12.5 mL

## 2020-01-01 MED ORDER — CHLORHEXIDINE GLUCONATE 4 % EX LIQD: 60.0000 mL | Freq: Once | CUTANEOUS | Status: DC

## 2020-01-01 MED ORDER — PHENYLEPHRINE HCL-NACL 10-0.9 MG/250ML-% IV SOLN
INTRAVENOUS | Status: DC | PRN
  Administered 2020-01-01: 20 ug/min via INTRAVENOUS

## 2020-01-01 MED ORDER — PROPOFOL 1000 MG/100ML IV EMUL
INTRAVENOUS | Status: AC
  Filled 2020-01-01: qty 100

## 2020-01-01 MED ORDER — SODIUM CHLORIDE 0.9 % IV SOLN
INTRAVENOUS | Status: AC
  Filled 2020-01-01: qty 1.2

## 2020-01-01 MED ORDER — SODIUM CHLORIDE 0.9 % IV SOLN: INTRAVENOUS | Status: DC | PRN

## 2020-01-01 MED ORDER — HEPARIN SODIUM (PORCINE) 1000 UNIT/ML IJ SOLN
INTRAMUSCULAR | Status: DC | PRN
  Administered 2020-01-01: 3400 [IU]

## 2020-01-01 MED ORDER — ONDANSETRON HCL 4 MG/2ML IJ SOLN
INTRAMUSCULAR | Status: DC | PRN
  Administered 2020-01-01: 4 mg via INTRAVENOUS

## 2020-01-01 MED ORDER — LIDOCAINE-EPINEPHRINE 1 %-1:100000 IJ SOLN
INTRAMUSCULAR | Status: AC
  Filled 2020-01-01: qty 1

## 2020-01-01 MED ORDER — LIDOCAINE-EPINEPHRINE 1 %-1:100000 IJ SOLN
INTRAMUSCULAR | Status: DC | PRN
  Administered 2020-01-01: 4 mL
  Administered 2020-01-01: 16 mL

## 2020-01-01 MED ORDER — PROPOFOL 10 MG/ML IV BOLUS
INTRAVENOUS | Status: DC | PRN
  Administered 2020-01-01: 10 mg via INTRAVENOUS

## 2020-01-01 MED ORDER — LIDOCAINE HCL (PF) 1 % IJ SOLN
INTRAMUSCULAR | Status: AC
  Filled 2020-01-01: qty 30

## 2020-01-01 MED ORDER — 0.9 % SODIUM CHLORIDE (POUR BTL) OPTIME
TOPICAL | Status: DC | PRN
  Administered 2020-01-01: 1000 mL

## 2020-01-01 MED ORDER — PHENYLEPHRINE HCL (PRESSORS) 10 MG/ML IV SOLN
INTRAVENOUS | Status: AC
  Filled 2020-01-01: qty 1

## 2020-01-01 MED ORDER — ALBUMIN HUMAN 5 % IV SOLN: INTRAVENOUS | Status: DC | PRN

## 2020-01-01 MED ORDER — VANCOMYCIN HCL IN DEXTROSE 1-5 GM/200ML-% IV SOLN
1000.0000 mg | INTRAVENOUS | Status: AC
  Administered 2020-01-01: 1000 mg via INTRAVENOUS
  Filled 2020-01-01: qty 200

## 2020-01-01 MED ORDER — PROPOFOL 500 MG/50ML IV EMUL
INTRAVENOUS | Status: DC | PRN
  Administered 2020-01-01: 50 ug/kg/min via INTRAVENOUS

## 2020-01-01 MED ORDER — PAPAVERINE HCL 30 MG/ML IJ SOLN
INTRAMUSCULAR | Status: AC
  Filled 2020-01-01: qty 2

## 2020-01-01 MED ORDER — OXYCODONE HCL 5 MG PO TABS: 5.0000 mg | ORAL_TABLET | ORAL | 0 refills | Status: DC | PRN

## 2020-01-01 MED ORDER — FENTANYL CITRATE (PF) 250 MCG/5ML IJ SOLN
INTRAMUSCULAR | Status: AC
  Filled 2020-01-01: qty 5

## 2020-01-01 MED ORDER — SODIUM CHLORIDE 0.9 % IV SOLN: INTRAVENOUS | Status: DC

## 2020-01-01 MED ORDER — PROTAMINE SULFATE 10 MG/ML IV SOLN
INTRAVENOUS | Status: DC | PRN
  Administered 2020-01-01: 40 mg via INTRAVENOUS

## 2020-01-01 MED ORDER — PHENYLEPHRINE 40 MCG/ML (10ML) SYRINGE FOR IV PUSH (FOR BLOOD PRESSURE SUPPORT)
PREFILLED_SYRINGE | INTRAVENOUS | Status: DC | PRN
  Administered 2020-01-01 (×4): 80 ug via INTRAVENOUS

## 2020-01-01 MED ORDER — HEPARIN SODIUM (PORCINE) 1000 UNIT/ML IJ SOLN
INTRAMUSCULAR | Status: DC | PRN
  Administered 2020-01-01: 7000 [IU] via INTRAVENOUS

## 2020-01-01 SURGICAL SUPPLY — 52 items
ARMBAND PINK RESTRICT EXTREMIT (MISCELLANEOUS) ×4 IMPLANT
BAG DECANTER FOR FLEXI CONT (MISCELLANEOUS) ×2 IMPLANT
BIOPATCH RED 1 DISK 7.0 (GAUZE/BANDAGES/DRESSINGS) ×3 IMPLANT
BIOPATCH RED 1IN DISK 7.0MM (GAUZE/BANDAGES/DRESSINGS) ×1
CANISTER SUCT 3000ML PPV (MISCELLANEOUS) ×4 IMPLANT
CATH PALINDROME RT-P 15FX19CM (CATHETERS) IMPLANT
CATH PALINDROME RT-P 15FX23CM (CATHETERS) ×2 IMPLANT
CATH PALINDROME RT-P 15FX28CM (CATHETERS) IMPLANT
CATH PALINDROME RT-P 15FX55CM (CATHETERS) IMPLANT
CLIP VESOCCLUDE MED 6/CT (CLIP) ×4 IMPLANT
CLIP VESOCCLUDE SM WIDE 6/CT (CLIP) ×4 IMPLANT
COVER PROBE W GEL 5X96 (DRAPES) ×4 IMPLANT
COVER SURGICAL LIGHT HANDLE (MISCELLANEOUS) ×2 IMPLANT
COVER WAND RF STERILE (DRAPES) ×4 IMPLANT
DERMABOND ADVANCED (GAUZE/BANDAGES/DRESSINGS) ×4
DERMABOND ADVANCED .7 DNX12 (GAUZE/BANDAGES/DRESSINGS) ×2 IMPLANT
DRAPE C-ARM 42X72 X-RAY (DRAPES) ×4 IMPLANT
DRAPE CHEST BREAST 15X10 FENES (DRAPES) ×4 IMPLANT
ELECT REM PT RETURN 9FT ADLT (ELECTROSURGICAL) ×4
ELECTRODE REM PT RTRN 9FT ADLT (ELECTROSURGICAL) ×2 IMPLANT
GAUZE 4X4 16PLY RFD (DISPOSABLE) ×4 IMPLANT
GLOVE BIO SURGEON STRL SZ7.5 (GLOVE) ×4 IMPLANT
GOWN STRL REUS W/ TWL LRG LVL3 (GOWN DISPOSABLE) ×4 IMPLANT
GOWN STRL REUS W/ TWL XL LVL3 (GOWN DISPOSABLE) ×2 IMPLANT
GOWN STRL REUS W/TWL LRG LVL3 (GOWN DISPOSABLE) ×4
GOWN STRL REUS W/TWL XL LVL3 (GOWN DISPOSABLE) ×2
INSERT FOGARTY SM (MISCELLANEOUS) IMPLANT
KIT BASIN OR (CUSTOM PROCEDURE TRAY) ×4 IMPLANT
KIT TURNOVER KIT B (KITS) ×4 IMPLANT
NDL 18GX1X1/2 (RX/OR ONLY) (NEEDLE) ×2 IMPLANT
NDL HYPO 25GX1X1/2 BEV (NEEDLE) ×2 IMPLANT
NEEDLE 18GX1X1/2 (RX/OR ONLY) (NEEDLE) ×4 IMPLANT
NEEDLE HYPO 25GX1X1/2 BEV (NEEDLE) ×8 IMPLANT
NS IRRIG 1000ML POUR BTL (IV SOLUTION) ×4 IMPLANT
PACK CV ACCESS (CUSTOM PROCEDURE TRAY) ×4 IMPLANT
PACK SURGICAL SETUP 50X90 (CUSTOM PROCEDURE TRAY) ×4 IMPLANT
PAD ARMBOARD 7.5X6 YLW CONV (MISCELLANEOUS) ×8 IMPLANT
SOAP 2 % CHG 4 OZ (WOUND CARE) ×4 IMPLANT
SUT ETHILON 3 0 PS 1 (SUTURE) ×4 IMPLANT
SUT MNCRL AB 4-0 PS2 18 (SUTURE) ×2 IMPLANT
SUT PROLENE 6 0 BV (SUTURE) ×6 IMPLANT
SUT VIC AB 3-0 SH 27 (SUTURE) ×4
SUT VIC AB 3-0 SH 27X BRD (SUTURE) ×2 IMPLANT
SYR 10ML LL (SYRINGE) ×6 IMPLANT
SYR 20ML LL LF (SYRINGE) ×8 IMPLANT
SYR 5ML LL (SYRINGE) ×4 IMPLANT
SYR CONTROL 10ML LL (SYRINGE) ×4 IMPLANT
TOWEL GREEN STERILE (TOWEL DISPOSABLE) ×4 IMPLANT
TOWEL GREEN STERILE FF (TOWEL DISPOSABLE) ×8 IMPLANT
UNDERPAD 30X30 (UNDERPADS AND DIAPERS) ×4 IMPLANT
WATER STERILE IRR 1000ML POUR (IV SOLUTION) ×4 IMPLANT
WIRE MICRO SET SILHO 5FR 7 (SHEATH) ×2 IMPLANT

## 2020-01-01 NOTE — Transfer of Care (Signed)
Immediate Anesthesia Transfer of Care Note  Patient: Terry Graves.  Procedure(s) Performed: LEFT ARM ARTERIOVENOUS (AV) FISTULA CREATION (Left Arm Upper) INSERTION OF RIGHT INTERNAL JUGULAR DIALYSIS CATHETER (Right Neck)  Patient Location: PACU  Anesthesia Type:MAC  Level of Consciousness: drowsy and patient cooperative  Airway & Oxygen Therapy: Patient Spontanous Breathing  Post-op Assessment: Report given to RN, Post -op Vital signs reviewed and stable and Patient moving all extremities X 4  Post vital signs: Reviewed and stable  Last Vitals:  Vitals Value Taken Time  BP 128/74 01/01/20 1230  Temp 36.5 C 01/01/20 1230  Pulse 58 01/01/20 1233  Resp 18 01/01/20 1233  SpO2 93 % 01/01/20 1233  Vitals shown include unvalidated device data.  Last Pain:  Vitals:   01/01/20 0944  TempSrc:   PainSc: 0-No pain         Complications: No apparent anesthesia complications

## 2020-01-01 NOTE — Op Note (Signed)
    NAME: Terry Graves.    MRN: [id number] DOB: 1935/10/07    DATE OF OPERATION: 01/01/2020  PREOP DIAGNOSIS:    End-stage renal disease  POSTOP DIAGNOSIS:    Same  PROCEDURE:    1.  Ultrasound-guided placement of 23 cm tunneled dialysis catheter (right IJ) 2.  Left brachiocephalic AV fistula  SURGEON: Judeth Cornfield. Scot Dock, MD  ASSIST: Risa Grill, PA  ANESTHESIA: Local with sedation  EBL: Minimal  INDICATIONS:    Terry Graves. is a 85 y.o. male who was to begin dialysis in 2 days.  We were asked to place a catheter and long-term access.  FINDINGS:   The brachial artery was calcified with moderate disease.  The upper arm cephalic vein was 3.5 mm.  There was a good radial and ulnar signal with a Doppler at the completion of the procedure.  TECHNIQUE:   The patient was taken to the operating room and sedated by anesthesia.  The neck and upper chest were prepped and draped in the usual sterile fashion.  Under ultrasound guidance, after the skin was anesthetized, the right IJ was cannulated with a micropuncture needle and a punk micropuncture sheath introduced over the wire.  A J-wire was then advanced through the sheath and followed under fluoroscopy.  The tract over the wire was dilated and then the dilator and peel-away sheath were advanced over the wire and the wire and dilator removed.  A 23 cm catheter was passed through the peel-away sheath and the peel-away sheath removed.  The exit site for the catheter was selected and the skin anesthetized between the 2 areas.  The catheter was brought to the tunnel cut to the appropriate length and the distal ports were attached.  Both ports withdrew easily with and flushed with heparinized saline and filled with concentrated heparin.  The catheter was secured at its exit site with a 3-0 nylon suture.  The IJ cannulation site was closed with a 4-0 subcuticular stitch.  Sterile dressing was applied.  Attention was then turned  to the left arm.  I did look at the veins myself with the SonoSite and I felt that he was a candidate for a brachiocephalic fistula.  After the skin was anesthetized, after the arm had been prepped and draped in usual sterile fashion, a transverse incision was made just above the antecubital level.  Here the cephalic vein was dissected free and branches divided between clips and 3-0 silk ties.  The vein was ligated distally and irrigated up nicely with heparinized saline.  The brachial artery was dissected free beneath the fascia.  The patient was heparinized.  The artery was calcified.  I was able to clamp proximally and distally and a longitudinal arteriotomy was made.  The vein was slightly spatulated and sewn end-to-side to the artery using continuous 6-0 Prolene suture.  At the completion was a good thrill in the fistula.  There was a radial and ulnar signal with the Doppler.  The heparin was partially reversed with protamine.  The wound was closed with a deep layer of 3-0 Vicryl and the skin closed with 4-0 Vicryl.  Dermabond was applied.  The patient tolerated the procedure well was transferred to the recovery room in stable condition.  All needle and sponge counts were correct.  Deitra Mayo, MD, FACS [facility]  DATE OF DICTATION:   01/01/2020

## 2020-01-01 NOTE — Anesthesia Postprocedure Evaluation (Signed)
Anesthesia Post Note  Patient: Terry Graves.  Procedure(s) Performed: LEFT ARM ARTERIOVENOUS (AV) FISTULA CREATION (Left Arm Upper) INSERTION OF RIGHT INTERNAL JUGULAR DIALYSIS CATHETER (Right Neck)     Patient location during evaluation: PACU Anesthesia Type: MAC Level of consciousness: awake and alert Pain management: pain level controlled Vital Signs Assessment: post-procedure vital signs reviewed and stable Respiratory status: spontaneous breathing, nonlabored ventilation, respiratory function stable and patient connected to nasal cannula oxygen Cardiovascular status: stable and blood pressure returned to baseline Postop Assessment: no apparent nausea or vomiting Anesthetic complications: no    Last Vitals:  Vitals:   01/01/20 1315 01/01/20 1322  BP: (!) 132/53 127/60  Pulse: (!) 54 65  Resp: 12 12  Temp:  36.5 C  SpO2: 98% 98%    Last Pain:  Vitals:   01/01/20 1300  TempSrc:   PainSc: 0-No pain                 Revecca Nachtigal COKER

## 2020-01-01 NOTE — Interval H&P Note (Signed)
History and Physical Interval Note:  01/01/2020 10:04 AM  Terry Graves.  has presented today for surgery, with the diagnosis of END STAGE RENAL DISEASE.  The various methods of treatment have been discussed with the patient and family. After consideration of risks, benefits and other options for treatment, the patient has consented to  Procedure(s): LEFT ARM ARTERIOVENOUS (AV) FISTULA CREATION VERSES ARTERIOVENOUS GRAFT (Left) INSERTION OF DIALYSIS CATHETER (N/A) as a surgical intervention.  The patient's history has been reviewed, patient examined, no change in status, stable for surgery.  I have reviewed the patient's chart and labs.  Questions were answered to the patient's satisfaction.     Deitra Mayo

## 2020-01-03 ENCOUNTER — Encounter (HOSPITAL_COMMUNITY): Payer: Medicare Other

## 2020-01-03 ENCOUNTER — Encounter (HOSPITAL_COMMUNITY)
Admission: RE | Admit: 2020-01-03 | Discharge: 2020-01-03 | Disposition: A | Discharge status: Home / Self Care | Payer: Medicare Other | Source: Ambulatory Visit | Attending: Nephrology | Admitting: Nephrology

## 2020-01-18 ENCOUNTER — Other Ambulatory Visit: Payer: Self-pay | Admitting: *Deleted

## 2020-01-18 DIAGNOSIS — N184 Chronic kidney disease, stage 4 (severe): Secondary | ICD-10-CM

## 2020-01-18 DIAGNOSIS — I739 Peripheral vascular disease, unspecified: Secondary | ICD-10-CM

## 2020-01-24 ENCOUNTER — Ambulatory Visit: Payer: Medicare Other

## 2020-01-24 ENCOUNTER — Encounter (HOSPITAL_COMMUNITY): Payer: Medicare Other

## 2020-02-05 ENCOUNTER — Telehealth (HOSPITAL_COMMUNITY): Payer: Self-pay

## 2020-02-05 NOTE — Telephone Encounter (Signed)

## 2020-02-06 ENCOUNTER — Ambulatory Visit (INDEPENDENT_AMBULATORY_CARE_PROVIDER_SITE_OTHER): Payer: Self-pay | Admitting: Physician Assistant

## 2020-02-06 ENCOUNTER — Ambulatory Visit (INDEPENDENT_AMBULATORY_CARE_PROVIDER_SITE_OTHER)
Admission: RE | Admit: 2020-02-06 | Discharge: 2020-02-06 | Disposition: A | Discharge status: Home / Self Care | Payer: Medicare Other | Source: Ambulatory Visit | Attending: Vascular Surgery | Admitting: Vascular Surgery

## 2020-02-06 ENCOUNTER — Ambulatory Visit (HOSPITAL_COMMUNITY)
Admission: RE | Admit: 2020-02-06 | Discharge: 2020-02-06 | Disposition: A | Discharge status: Home / Self Care | Payer: Medicare Other | Source: Ambulatory Visit | Attending: Vascular Surgery | Admitting: Vascular Surgery

## 2020-02-06 ENCOUNTER — Other Ambulatory Visit: Payer: Self-pay

## 2020-02-06 VITALS — BP 110/56 | HR 62 | Temp 97.7°F | Resp 18 | Ht 69.0 in | Wt 185.0 lb

## 2020-02-06 DIAGNOSIS — N184 Chronic kidney disease, stage 4 (severe): Secondary | ICD-10-CM

## 2020-02-06 DIAGNOSIS — I739 Peripheral vascular disease, unspecified: Secondary | ICD-10-CM

## 2020-02-06 DIAGNOSIS — N186 End stage renal disease: Secondary | ICD-10-CM

## 2020-02-06 NOTE — Progress Notes (Signed)
    Postoperative Access Visit   History of Present Illness   Terry Graves. is a 85 y.o. year old male who presents for postoperative follow-up for: left brachiocephalic arteriovenous fistula by Dr. Scot Dock (Date: 01/01/20).  The patient's wounds are healed.  The patient denies steal symptoms.  The patient is able to complete their activities of daily living.  He is dialyzing via R IJ TDC on a TTS schedule.  He is also here to go over ABI study.  He denies claudication, rest pain, or non healing wounds.  He is ambulatory with a cane however admittedly is not walking much due to arthritis of both knees.   Physical Examination   Vitals:   02/06/20 0850  BP: (!) 110/56  Pulse: 62  Resp: 18  Temp: 97.7 F (36.5 C)  TempSrc: Temporal  SpO2: 99%  Weight: 185 lb (83.9 kg)  Height: 5\' 9"  (1.753 m)   Body mass index is 27.32 kg/m.  left arm Incision is healed, faintly palpable radial pulse, hand grip is 5/5, sensation in digits is intact, soft thrill, bruit can be auscultated     Medical Decision Making   Terry Graves. is a 85 y.o. year old male who presents s/p left brachiocephalic arteriovenous fistula   Patent without signs or symptoms of steal syndrome  Soft thrill noted on exam however patient has a narrowing in outflow vein noted on duplex  Recheck duplex in 1 month to determine if patient will require a fistulogram prior to using fistula for HD   Decreased ABIs however no rest pain or non healing wounds  No indication for revascularization currently  Recheck ABIs in 1 year   Dagoberto Ligas PA-C [facility] [place] Office: [phone]  Clinic MD: Scot Dock

## 2020-02-07 ENCOUNTER — Other Ambulatory Visit: Payer: Self-pay | Admitting: *Deleted

## 2020-02-07 DIAGNOSIS — I739 Peripheral vascular disease, unspecified: Secondary | ICD-10-CM

## 2020-02-07 DIAGNOSIS — N184 Chronic kidney disease, stage 4 (severe): Secondary | ICD-10-CM

## 2020-03-06 ENCOUNTER — Telehealth (HOSPITAL_COMMUNITY): Payer: Self-pay

## 2020-03-06 NOTE — Telephone Encounter (Signed)

## 2020-03-07 ENCOUNTER — Encounter: Payer: Self-pay | Admitting: Physician Assistant

## 2020-03-07 ENCOUNTER — Other Ambulatory Visit: Payer: Self-pay

## 2020-03-07 ENCOUNTER — Ambulatory Visit (INDEPENDENT_AMBULATORY_CARE_PROVIDER_SITE_OTHER): Payer: Self-pay | Admitting: Physician Assistant

## 2020-03-07 ENCOUNTER — Ambulatory Visit (HOSPITAL_COMMUNITY): Admission: RE | Admit: 2020-03-07 | Payer: Medicare Other | Source: Ambulatory Visit

## 2020-03-07 ENCOUNTER — Ambulatory Visit (HOSPITAL_COMMUNITY)
Admission: RE | Admit: 2020-03-07 | Discharge: 2020-03-07 | Disposition: A | Discharge status: Home / Self Care | Payer: Medicare Other | Source: Ambulatory Visit | Attending: Physician Assistant | Admitting: Physician Assistant

## 2020-03-07 VITALS — BP 130/69 | HR 85 | Temp 97.7°F | Resp 16 | Ht 69.0 in | Wt 185.0 lb

## 2020-03-07 DIAGNOSIS — N186 End stage renal disease: Secondary | ICD-10-CM

## 2020-03-07 DIAGNOSIS — N184 Chronic kidney disease, stage 4 (severe): Secondary | ICD-10-CM

## 2020-03-07 MED ORDER — SODIUM CHLORIDE 0.9 % IV SOLN: 250.0000 mL | INTRAVENOUS | Status: DC | PRN

## 2020-03-07 MED ORDER — SODIUM CHLORIDE 0.9% FLUSH: 3.0000 mL | INTRAVENOUS | Status: DC | PRN

## 2020-03-07 MED ORDER — SODIUM CHLORIDE 0.9% FLUSH: 3.0000 mL | Freq: Two times a day (BID) | INTRAVENOUS | Status: DC

## 2020-03-07 NOTE — H&P (View-Only) (Signed)
Established Dialysis Access   History of Present Illness   Kerney Hopfensperger. is a 85 y.o. (1935/07/07) male who presents for re-evaluation of left arm fistula.  Left brachiocephalic fistula was created by Dr. Scot Dock on 01/01/2020.  He denies signs or symptoms of steal syndrome left hand.  He is dialyzing via right IJ [facility] on a Tuesday Thursday Saturday schedule.  He is on Xarelto.    Current Outpatient Medications  Medication Sig Dispense Refill  . acetaminophen (TYLENOL) 650 MG CR tablet Take 1,300 mg by mouth in the morning and at bedtime.    Marland Kitchen atorvastatin (LIPITOR) 40 MG tablet Take 40 mg by mouth every evening.     . carvedilol (COREG) 12.5 MG tablet Take 12.5 mg by mouth 2 (two) times daily.    . Darbepoetin Alfa (ARANESP) 60 MCG/0.3ML SOSY injection Inject 60 mcg into the skin every 14 (fourteen) days.    Marland Kitchen levothyroxine (SYNTHROID) 25 MCG tablet Take 25 mcg by mouth daily before breakfast.    . nitroGLYCERIN (NITROSTAT) 0.4 MG SL tablet Place 0.4 mg under the tongue every 5 (five) minutes as needed for chest pain.    Marland Kitchen OVER THE COUNTER MEDICATION Take 1 tablet by mouth in the morning and at bedtime. [employer] blood builder Minis    . OVER THE COUNTER MEDICATION Take 1 tablet by mouth in the morning, at noon, and at bedtime. [place]    . pantoprazole (PROTONIX) 40 MG tablet Take 40 mg by mouth daily.     Marland Kitchen rOPINIRole (REQUIP) 0.5 MG tablet Take 0.5 mg by mouth at bedtime.    Marland Kitchen terazosin (HYTRIN) 1 MG capsule Take 1 mg by mouth at bedtime.    Alveda Reasons 15 MG TABS tablet Take 15 mg by mouth at bedtime.     No current facility-administered medications for this visit.    REVIEW OF SYSTEMS (negative unless checked):   Cardiac:  []  Chest pain or chest pressure? []  Shortness of breath upon activity? []  Shortness of breath when lying flat? []  Irregular heart rhythm?  Vascular:  []  Pain in calf, thigh, or hip brought on by walking? []  Pain in feet at night that wakes you up  from your sleep? []  Blood clot in your veins? []  Leg swelling?  Pulmonary:  []  Oxygen at home? []  Productive cough? []  Wheezing?  Neurologic:  []  Sudden weakness in arms or legs? []  Sudden numbness in arms or legs? []  Sudden onset of difficult speaking or slurred speech? []  Temporary loss of vision in one eye? []  Problems with dizziness?  Gastrointestinal:  []  Blood in stool? []  Vomited blood?  Genitourinary:  []  Burning when urinating? []  Blood in urine?  Psychiatric:  []  Major depression  Hematologic:  []  Bleeding problems? []  Problems with blood clotting?  Dermatologic:  []  Rashes or ulcers?  Constitutional:  []  Fever or chills?  Ear/Nose/Throat:  []  Change in hearing? []  Nose bleeds? []  Sore throat?  Musculoskeletal:  []  Back pain? []  Joint pain? []  Muscle pain?   Physical Examination   Vitals:   03/07/20 1500  BP: 130/69  Pulse: 85  Resp: 16  Temp: 97.7 F (36.5 C)  TempSrc: Temporal  SpO2: 98%  Weight: 185 lb (83.9 kg)  Height: 5\' 9"  (1.753 m)   Body mass index is 27.32 kg/m.  General:  WDWN in NAD; vital signs documented above Gait: Not observed HENT: WNL, normocephalic Pulmonary: normal non-labored breathing , without Rales, rhonchi,  wheezing  Cardiac: regular HR Abdomen: soft, NT, no masses Skin: without rashes Vascular Exam/Pulses:  Right Left  Radial 2+ (normal) 1+ (weak)  Ulnar absent absent   Extremities: easily palpable thrill L AC fossa which becomes more pulsatile in mid to upper arm Musculoskeletal: no muscle wasting or atrophy  Neurologic: A&O X 3;  No focal weakness or paresthesias are detected Psychiatric:  The pt has Normal affect.   Non-invasive Vascular Imaging   left Arm Access Duplex:   Diameters:  5 mm  Depth:  <6 mm  Branching in distal arm and mid/proximal arm     Medical Decision Making   Ameer Sanden. is a 85 y.o. male who presents with ESRD requiring hemodialysis.    Patent L  brachiocephalic fistula without signs of steal syndrome  Easily palpable thrill near anastomosis however this becomes more pulsatile in the mid to upper arm  I discussed options of allowing dialysis access fistula at the 12-week mark versus proceeding with left arm fistulogram.  In discussing these options with the patient and his daughter they would like to proceed with fistulogram at the next available nondialysis day.  I think this is appropriate given the soft thrill and somewhat pulsatile flow as well as borderline diameter measurement of the fistula  Risks and benefits of fistulogram were discussed with the patient and he agrees to proceed   Dagoberto Ligas PA-C [facility] [place] Office: [facility] MD: Donzetta Matters

## 2020-03-07 NOTE — Progress Notes (Signed)
Established Dialysis Access   History of Present Illness   Terry Graves. is a 85 y.o. (1935-04-19) male who presents for re-evaluation of left arm fistula.  Left brachiocephalic fistula was created by Dr. Scot Graves on 01/01/2020.  He denies signs or symptoms of steal syndrome left hand.  He is dialyzing via right IJ [facility] on a Tuesday Thursday Saturday schedule.  He is on Xarelto.    Current Outpatient Medications  Medication Sig Dispense Refill  . acetaminophen (TYLENOL) 650 MG CR tablet Take 1,300 mg by mouth in the morning and at bedtime.    Marland Kitchen atorvastatin (LIPITOR) 40 MG tablet Take 40 mg by mouth every evening.     . carvedilol (COREG) 12.5 MG tablet Take 12.5 mg by mouth 2 (two) times daily.    . Darbepoetin Alfa (ARANESP) 60 MCG/0.3ML SOSY injection Inject 60 mcg into the skin every 14 (fourteen) days.    Marland Kitchen levothyroxine (SYNTHROID) 25 MCG tablet Take 25 mcg by mouth daily before breakfast.    . nitroGLYCERIN (NITROSTAT) 0.4 MG SL tablet Place 0.4 mg under the tongue every 5 (five) minutes as needed for chest pain.    Marland Kitchen OVER THE COUNTER MEDICATION Take 1 tablet by mouth in the morning and at bedtime. [employer] blood builder [employer]is    . OVER THE COUNTER MEDICATION Take 1 tablet by mouth in the morning, at noon, and at bedtime. [place]    . pantoprazole (PROTONIX) 40 MG tablet Take 40 mg by mouth daily.     Marland Kitchen rOPINIRole (REQUIP) 0.5 MG tablet Take 0.5 mg by mouth at bedtime.    Marland Kitchen terazosin (HYTRIN) 1 MG capsule Take 1 mg by mouth at bedtime.    Alveda Reasons 15 MG TABS tablet Take 15 mg by mouth at bedtime.     No current facility-administered medications for this visit.    REVIEW OF SYSTEMS (negative unless checked):   Cardiac:  []  Chest pain or chest pressure? []  Shortness of breath upon activity? []  Shortness of breath when lying flat? []  Irregular heart rhythm?  Vascular:  []  Pain in calf, thigh, or hip brought on by walking? []  Pain in feet at night that wakes you up  from your sleep? []  Blood clot in your veins? []  Leg swelling?  Pulmonary:  []  Oxygen at home? []  Productive cough? []  Wheezing?  Neurologic:  []  Sudden weakness in arms or legs? []  Sudden numbness in arms or legs? []  Sudden onset of difficult speaking or slurred speech? []  Temporary loss of vision in one eye? []  Problems with dizziness?  Gastrointestinal:  []  Blood in stool? []  Vomited blood?  Genitourinary:  []  Burning when urinating? []  Blood in urine?  Psychiatric:  []  Major depression  Hematologic:  []  Bleeding problems? []  Problems with blood clotting?  Dermatologic:  []  Rashes or ulcers?  Constitutional:  []  Fever or chills?  Ear/Nose/Throat:  []  Change in hearing? []  Nose bleeds? []  Sore throat?  Musculoskeletal:  []  Back pain? []  Joint pain? []  Muscle pain?   Physical Examination   Vitals:   03/07/20 1500  BP: 130/69  Pulse: 85  Resp: 16  Temp: 97.7 F (36.5 C)  TempSrc: Temporal  SpO2: 98%  Weight: 185 lb (83.9 kg)  Height: 5\' 9"  (1.753 m)   Body mass index is 27.32 kg/m.  General:  WDWN in NAD; vital signs documented above Gait: Not observed HENT: WNL, normocephalic Pulmonary: normal non-labored breathing , without Rales, rhonchi,  wheezing  Cardiac: regular HR Abdomen: soft, NT, no masses Skin: without rashes Vascular Exam/Pulses:  Right Left  Radial 2+ (normal) 1+ (weak)  Ulnar absent absent   Extremities: easily palpable thrill L AC fossa which becomes more pulsatile in mid to upper arm Musculoskeletal: no muscle wasting or atrophy  Neurologic: A&O X 3;  No focal weakness or paresthesias are detected Psychiatric:  The pt has Normal affect.   Non-invasive Vascular Imaging   left Arm Access Duplex:   Diameters:  5 mm  Depth:  <6 mm  Branching in distal arm and mid/proximal arm     Medical Decision Making   Terry Clink. is a 85 y.o. male who presents with ESRD requiring hemodialysis.    Patent L  brachiocephalic fistula without signs of steal syndrome  Easily palpable thrill near anastomosis however this becomes more pulsatile in the mid to upper arm  I discussed options of allowing dialysis access fistula at the 12-week mark versus proceeding with left arm fistulogram.  In discussing these options with the patient and his daughter they would like to proceed with fistulogram at the next available nondialysis day.  I think this is appropriate given the soft thrill and somewhat pulsatile flow as well as borderline diameter measurement of the fistula  Risks and benefits of fistulogram were discussed with the patient and he agrees to proceed   Terry Ligas PA-C [facility] [place] Office: [facility] MD: Terry Graves

## 2020-03-12 ENCOUNTER — Telehealth: Payer: Self-pay

## 2020-03-12 ENCOUNTER — Other Ambulatory Visit (HOSPITAL_COMMUNITY)
Admission: RE | Admit: 2020-03-12 | Discharge: 2020-03-12 | Disposition: A | Discharge status: Home / Self Care | Payer: Medicare Other | Source: Ambulatory Visit | Attending: Vascular Surgery | Admitting: Vascular Surgery

## 2020-03-12 DIAGNOSIS — Z20822 Contact with and (suspected) exposure to covid-19: Secondary | ICD-10-CM | POA: Insufficient documentation

## 2020-03-12 DIAGNOSIS — Z01812 Encounter for preprocedural laboratory examination: Secondary | ICD-10-CM | POA: Diagnosis present

## 2020-03-12 LAB — SARS CORONAVIRUS 2 (TAT 6-24 HRS): SARS Coronavirus 2: NEGATIVE

## 2020-03-12 NOTE — Telephone Encounter (Signed)
Butch Penny called from [employer] requesting [place] call them after pt has fistulogram on Friday with the results.

## 2020-03-14 ENCOUNTER — Encounter (HOSPITAL_COMMUNITY)
Admission: RE | Disposition: A | Discharge status: Home / Self Care | Payer: Self-pay | Source: Home / Self Care | Attending: Vascular Surgery

## 2020-03-14 ENCOUNTER — Ambulatory Visit (HOSPITAL_COMMUNITY)
Admission: RE | Admit: 2020-03-14 | Discharge: 2020-03-14 | Disposition: A | Discharge status: Home / Self Care | Payer: Medicare Other | Attending: Vascular Surgery | Admitting: Vascular Surgery

## 2020-03-14 DIAGNOSIS — T82510A Breakdown (mechanical) of surgically created arteriovenous fistula, initial encounter: Secondary | ICD-10-CM | POA: Diagnosis present

## 2020-03-14 DIAGNOSIS — Z7901 Long term (current) use of anticoagulants: Secondary | ICD-10-CM | POA: Insufficient documentation

## 2020-03-14 DIAGNOSIS — Y841 Kidney dialysis as the cause of abnormal reaction of the patient, or of later complication, without mention of misadventure at the time of the procedure: Secondary | ICD-10-CM | POA: Diagnosis not present

## 2020-03-14 DIAGNOSIS — N186 End stage renal disease: Secondary | ICD-10-CM | POA: Insufficient documentation

## 2020-03-14 DIAGNOSIS — Z992 Dependence on renal dialysis: Secondary | ICD-10-CM

## 2020-03-14 DIAGNOSIS — Z79899 Other long term (current) drug therapy: Secondary | ICD-10-CM | POA: Diagnosis not present

## 2020-03-14 DIAGNOSIS — Z79890 Hormone replacement therapy (postmenopausal): Secondary | ICD-10-CM | POA: Diagnosis not present

## 2020-03-14 DIAGNOSIS — T82898A Other specified complication of vascular prosthetic devices, implants and grafts, initial encounter: Secondary | ICD-10-CM

## 2020-03-14 HISTORY — PX: A/V FISTULAGRAM: CATH118298

## 2020-03-14 LAB — POCT I-STAT, CHEM 8
BUN: 29 mg/dL — ABNORMAL HIGH (ref 8–23)
Calcium, Ion: 1.17 mmol/L (ref 1.15–1.40)
Chloride: 93 mmol/L — ABNORMAL LOW (ref 98–111)
Creatinine, Ser: 3.8 mg/dL — ABNORMAL HIGH (ref 0.61–1.24)
Glucose, Bld: 166 mg/dL — ABNORMAL HIGH (ref 70–99)
HCT: 40 % (ref 39.0–52.0)
Hemoglobin: 13.6 g/dL (ref 13.0–17.0)
Potassium: 3.5 mmol/L (ref 3.5–5.1)
Sodium: 136 mmol/L (ref 135–145)
TCO2: 32 mmol/L (ref 22–32)

## 2020-03-14 LAB — GLUCOSE, CAPILLARY
Glucose-Capillary: 147 mg/dL — ABNORMAL HIGH (ref 70–99)
Glucose-Capillary: 130 mg/dL — ABNORMAL HIGH (ref 70–99)

## 2020-03-14 IMAGING — CT CT ABDOMEN AND PELVIS WITHOUT CONTRAST
2 of 4 series · 17 of 46 positions shown, 19 images · non-contrast
Comparison: None.

CLINICAL DATA: Abdominal pain

EXAM:
CT ABDOMEN AND PELVIS WITHOUT CONTRAST
TECHNIQUE: Multidetector CT imaging of the abdomen and pelvis was performed
following the standard protocol without IV contrast.

[Series 2: axial st · axial · 0.88mm/px · z∈[+739,+1149]mm · 14 of 92 slices shown, 16 images]
[im 5/92  soft-tissue]
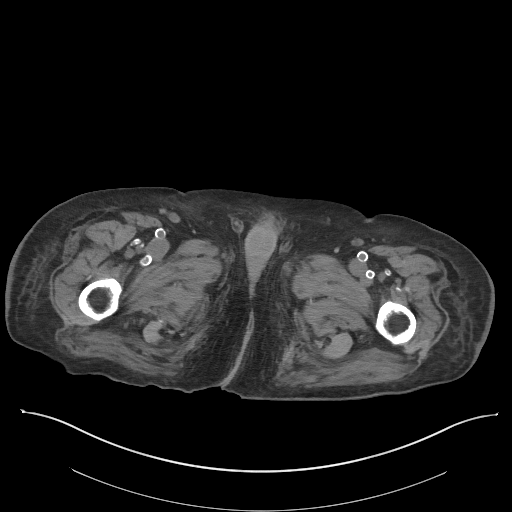
[im 5/92  bone]
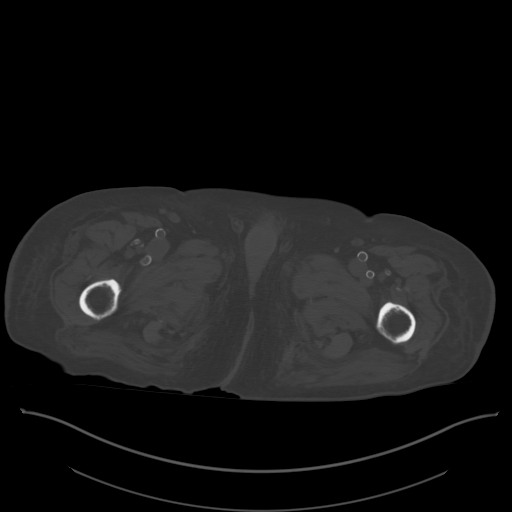
[im 10/92  soft-tissue]
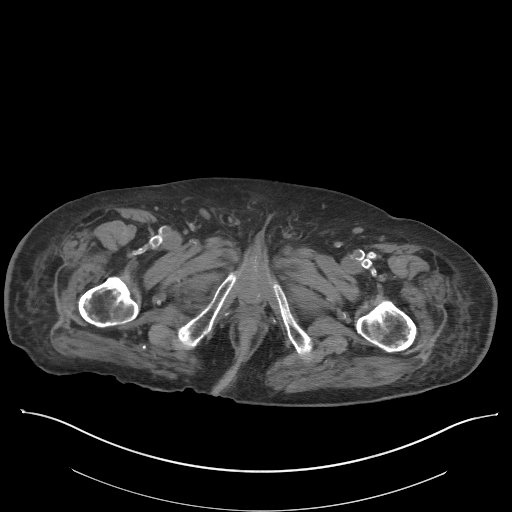
[im 20/92  soft-tissue]
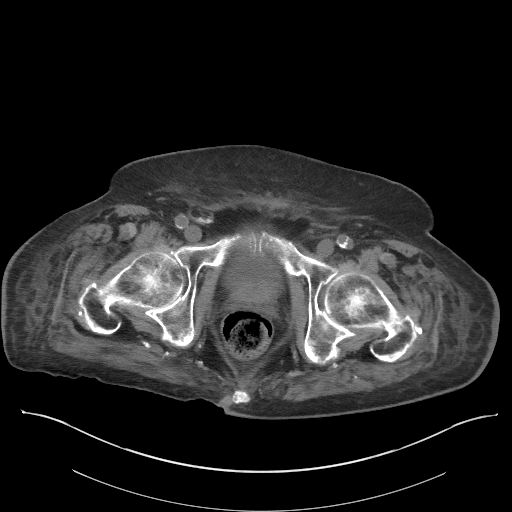
[im 24/92  soft-tissue]
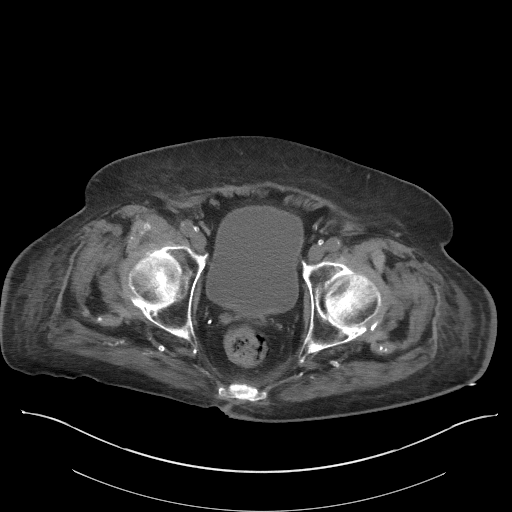
[im 29/92  soft-tissue]
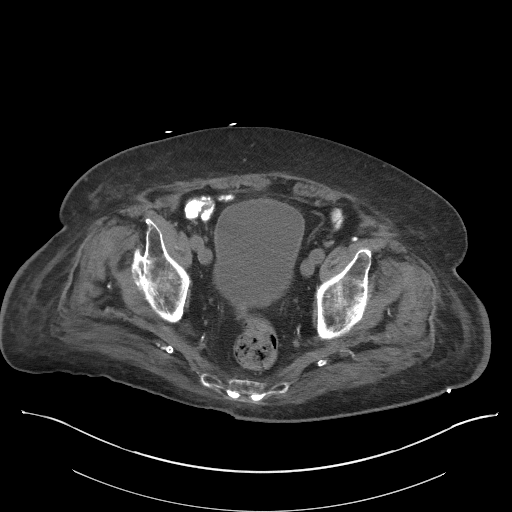
[im 39/92  soft-tissue]
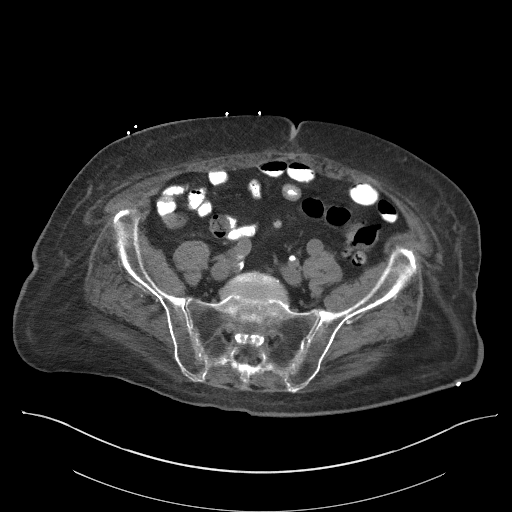
[im 44/92  soft-tissue]
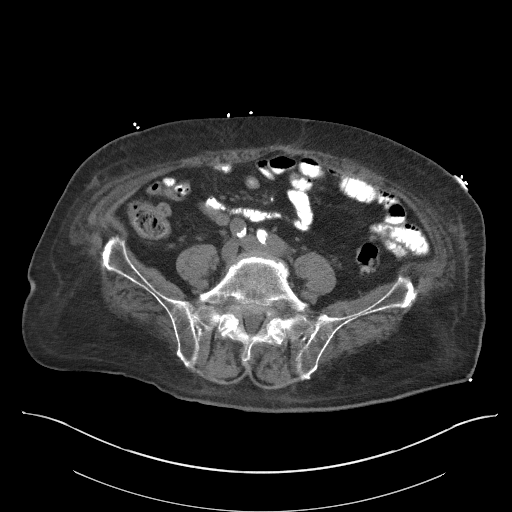
[im 48/92  soft-tissue]
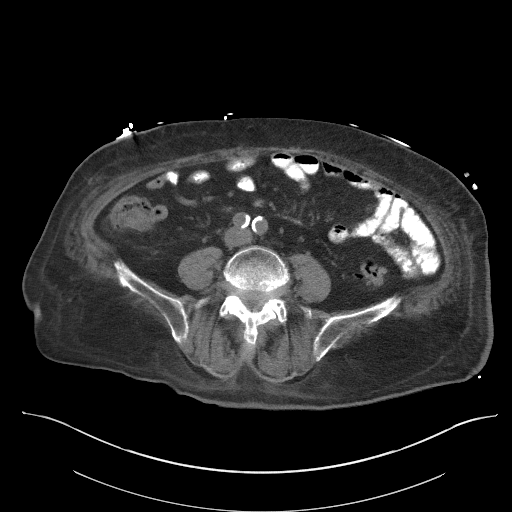
[im 53/92  soft-tissue]
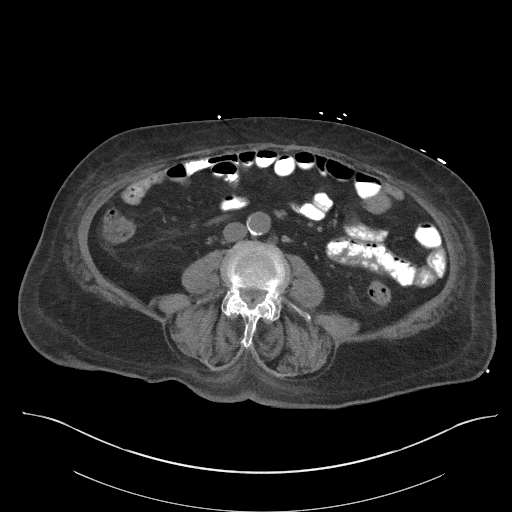
[im 53/92  bone]
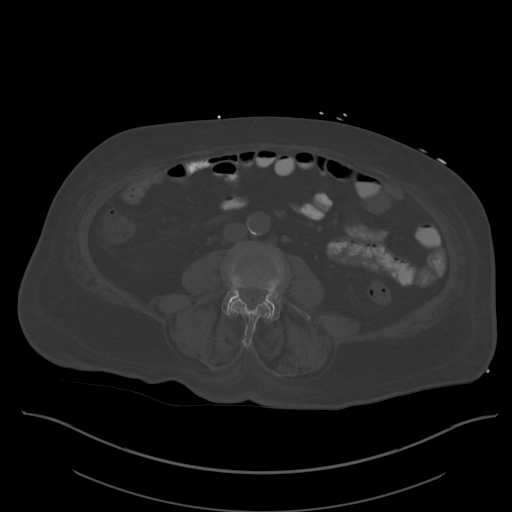
[im 63/92  soft-tissue]
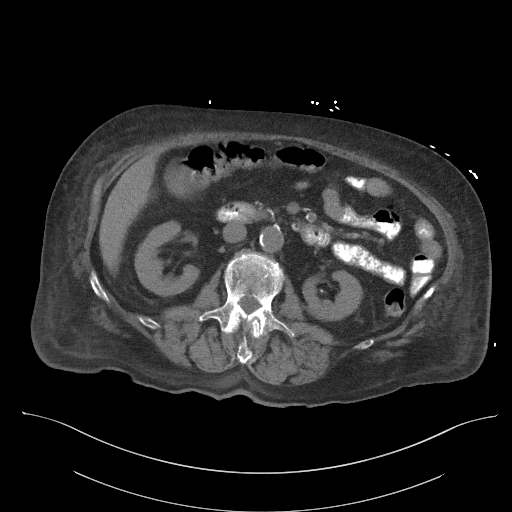
[im 68/92  soft-tissue]
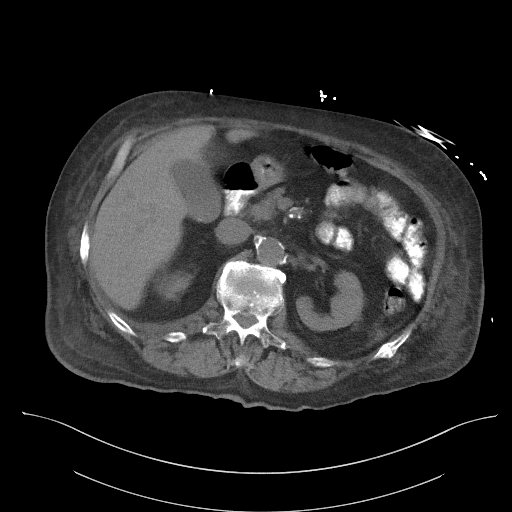
[im 72/92  soft-tissue]
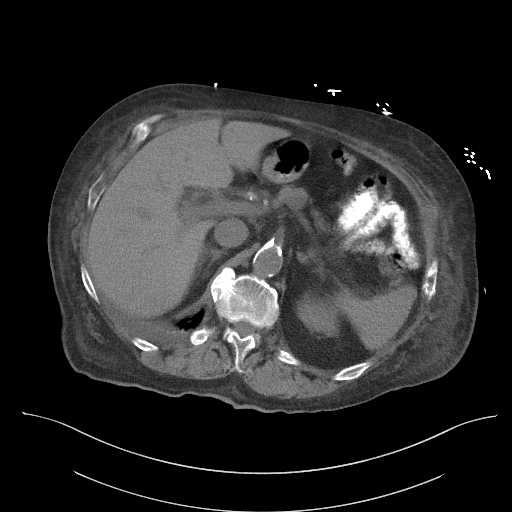
[im 82/92  soft-tissue]
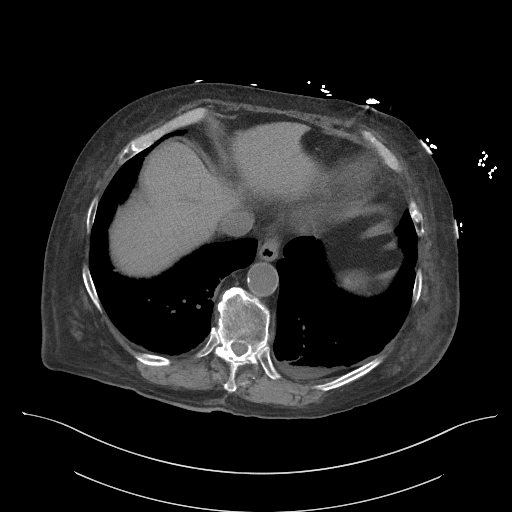
[im 87/92  soft-tissue]
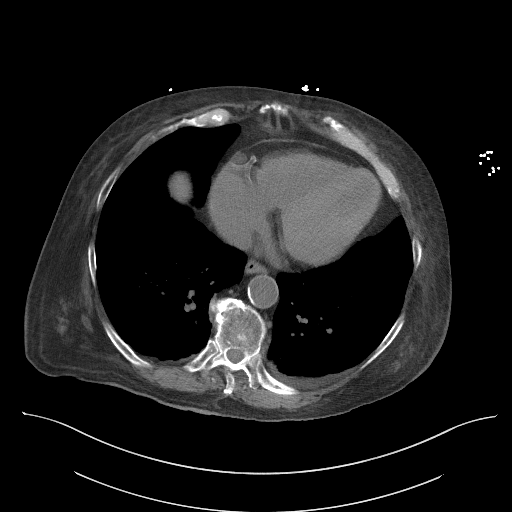

[Series 5: coronal st · coronal · 0.79mm/px · 3 of 92 slices shown]
[im 31/92  soft-tissue]
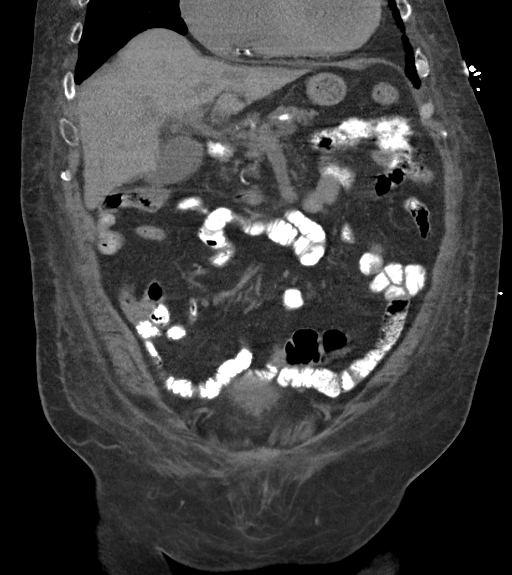
[im 41/92  soft-tissue]
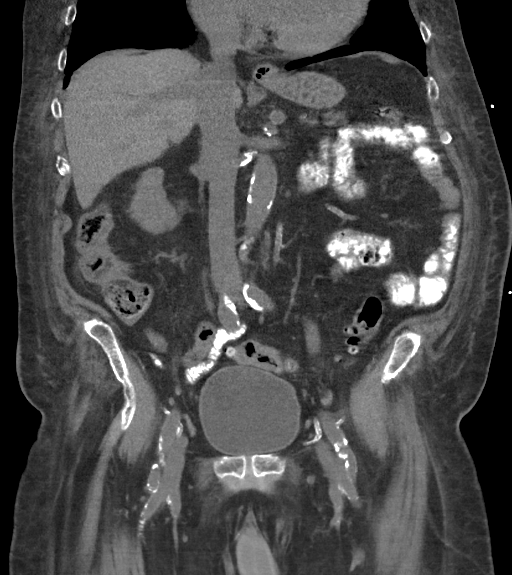
[im 51/92  soft-tissue]
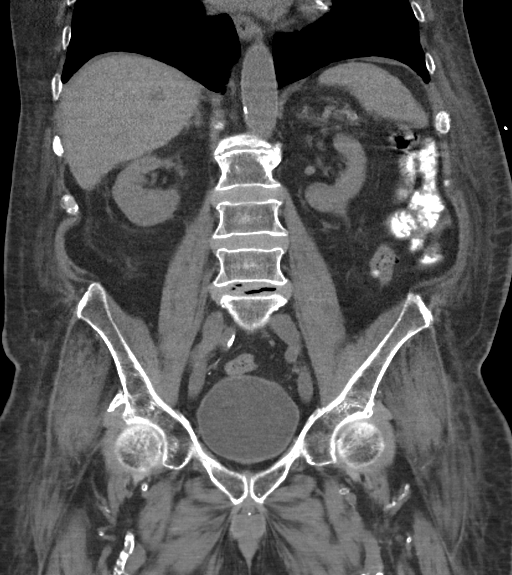

[17 of 46 positions shown; findings below may reference images not displayed]

FINDINGS: Lower chest: Mild cardiomegaly. Coronary artery calcifications.
Small bilateral pleural effusions with bibasilar atelectasis.

Hepatobiliary: Small layering gallstone within the gallbladder. No
focal hepatic abnormality. No biliary ductal dilatation.

Pancreas: No focal abnormality or ductal dilatation.

Spleen: No focal abnormality.  Normal size.

Adrenals/Urinary Tract: Punctate nonobstructing stone in the lower
pole of the left kidney. No hydronephrosis. No renal or adrenal
mass. Urinary bladder unremarkable.

Stomach/Bowel: Descending colonic and sigmoid diverticulosis. No
active diverticulitis. Appendix normal. Stomach and small bowel
decompressed, unremarkable.

Vascular/Lymphatic: Aortic atherosclerosis. No enlarged abdominal or
pelvic lymph nodes.

Reproductive: No visible focal abnormality.

Other: No free fluid or free air.

Musculoskeletal: No acute bony abnormality. Bilateral L5 pars
defects. Degenerative disc and facet 1 disease in the lumbar spine.
IMPRESSION: Punctate left lower pole nephrolithiasis.  No hydronephrosis.

Small bilateral effusions.  Bibasilar atelectasis.

Small layering gallstone within the gallbladder.

Left colonic diverticulosis.  No active diverticulitis.

Aortic atherosclerosis.

## 2020-03-14 SURGERY — A/V FISTULAGRAM
Anesthesia: LOCAL

## 2020-03-14 MED ORDER — HYDRALAZINE HCL 20 MG/ML IJ SOLN: 5.0000 mg | INTRAMUSCULAR | Status: DC | PRN

## 2020-03-14 MED ORDER — SODIUM CHLORIDE 0.9% FLUSH: 3.0000 mL | Freq: Two times a day (BID) | INTRAVENOUS | Status: DC

## 2020-03-14 MED ORDER — ONDANSETRON HCL 4 MG/2ML IJ SOLN: 4.0000 mg | Freq: Four times a day (QID) | INTRAMUSCULAR | Status: DC | PRN

## 2020-03-14 MED ORDER — HEPARIN (PORCINE) IN NACL 1000-0.9 UT/500ML-% IV SOLN
INTRAVENOUS | Status: DC | PRN
  Administered 2020-03-14: 500 mL

## 2020-03-14 MED ORDER — IODIXANOL 320 MG/ML IV SOLN
INTRAVENOUS | Status: DC | PRN
  Administered 2020-03-14: 40 mL

## 2020-03-14 MED ORDER — SODIUM CHLORIDE 0.9% FLUSH: 3.0000 mL | INTRAVENOUS | Status: DC | PRN

## 2020-03-14 MED ORDER — HEPARIN (PORCINE) IN NACL 1000-0.9 UT/500ML-% IV SOLN
INTRAVENOUS | Status: AC
  Filled 2020-03-14: qty 1000

## 2020-03-14 MED ORDER — SODIUM CHLORIDE 0.9 % IV SOLN: 250.0000 mL | INTRAVENOUS | Status: DC | PRN

## 2020-03-14 MED ORDER — LIDOCAINE HCL (PF) 1 % IJ SOLN
INTRAMUSCULAR | Status: AC
  Filled 2020-03-14: qty 30

## 2020-03-14 MED ORDER — LIDOCAINE HCL (PF) 1 % IJ SOLN
INTRAMUSCULAR | Status: DC | PRN
  Administered 2020-03-14: 3 mL

## 2020-03-14 MED ORDER — ACETAMINOPHEN 325 MG PO TABS: 650.0000 mg | ORAL_TABLET | ORAL | Status: DC | PRN

## 2020-03-14 MED ORDER — LABETALOL HCL 5 MG/ML IV SOLN: 10.0000 mg | INTRAVENOUS | Status: DC | PRN

## 2020-03-14 SURGICAL SUPPLY — 7 items
COVER DOME SNAP 22 D (MISCELLANEOUS) ×4 IMPLANT
KIT MICROPUNCTURE NIT STIFF (SHEATH) ×2 IMPLANT
PROTECTION STATION PRESSURIZED (MISCELLANEOUS) ×2
SHEATH PROBE COVER 6X72 (BAG) ×2 IMPLANT
STATION PROTECTION PRESSURIZED (MISCELLANEOUS) ×1 IMPLANT
TRAY PV CATH (CUSTOM PROCEDURE TRAY) ×2 IMPLANT
TUBING CIL FLEX 10 FLL-RA (TUBING) ×2 IMPLANT

## 2020-03-14 NOTE — Op Note (Signed)
Procedure: Left arm fistulogram, ultrasound left arm AV fistula  Preoperative diagnosis: Poorly functioning AV fistula left arm  Postoperative diagnosis: Same  Anesthesia: Local  Operative findings: Widely patent AV fistula left upper arm and central venous structures  Multiple side branches proximal third of AV fistula  Operative details: After the informed consent, patient taken the PV lab.  The patient placed in supine position angio table.  Patient's left upper extremities prepped and draped in usual sterile fashion.  Local anesthesia was infiltrated near the antecubital crease.  Ultrasound was used to identify the patient's left arm AV fistula.  Local anesthesia was infiltrated over this.  Micropuncture needle was used to cannulate the AV fistula micropuncture wire advanced into the fistula.  Micropuncture sheath was then placed over this.  Contrast angiogram was then obtained to the micropuncture sheath.  The superior vena cava and innominate left subclavian veins are all widely patent.  Left upper arm AV fistula is widely patent.  Blood pressure cuff was inflated in the upper arm to reflux contrast across the arterial anastomosis.  This was also widely patent.  There were multiple side branches in the proximal one third of the fistula.  Some of these were as large as 2-1/2 to 3 mm.  These may be diverging some flow of the fistula.  There was no narrowing of the fistula noted.  At this point the procedure was concluded a figure-of-eight stitch was placed at the micropuncture sheath exit site and the micropuncture sheath removed and hemostasis obtained with direct pressure.  Patient tired procedure well and there were no complications.  Patient was taken the holding area in stable condition.  Operative management: The patient will be scheduled in the near future for sidebranch ligation of his left arm AV fistula to assist with maturity of this.  Ruta Hinds, MD [facility] [place] Office: [phone]

## 2020-03-14 NOTE — Discharge Instructions (Signed)
Per Dr. Oneida Alar, pt may resume Xarelto this evening 03/14/20  Dialysis Fistulogram, Care After This sheet gives you information about how to care for yourself after your procedure. Your health care provider may also give you more specific instructions. If you have problems or questions, contact your health care provider. What can I expect after the procedure? After the procedure, it is common to have:  A small amount of discomfort in the area where the small, thin tube (catheter) was placed for the procedure.  A small amount of bruising around the fistula.  Sleepiness and tiredness (fatigue). Follow these instructions at home: Activity   Rest at home and do not lift anything that is heavier than 5 lb (2.3 kg) on the day after your procedure.  Return to your normal activities as told by your health care provider. Ask your health care provider what activities are safe for you.  Do not drive or use heavy machinery while taking prescription pain medicine.  Do not drive for 24 hours if you were given a medicine to help you relax (sedative) during your procedure. Medicines   Take over-the-counter and prescription medicines only as told by your health care provider. Puncture site care  Follow instructions from your health care provider about how to take care of the site where catheters were inserted. Make sure you: ? Wash your hands with soap and water before you change your bandage (dressing). If soap and water are not available, use hand sanitizer. ? Change your dressing as told by your health care provider. May remove dressing in 24 hours. They will remove at Dialysis ? Leave stitches (sutures), skin glue, or adhesive strips in place. These skin closures may need to stay in place for 2 weeks or longer. If adhesive strip edges start to loosen and curl up, you may trim the loose edges. Do not remove adhesive strips completely unless your health care provider tells you to do that.  Check your  puncture area every day for signs of infection. Check for: ? Redness, swelling, or pain. ? Fluid or blood. ? Warmth. ? Pus or a bad smell. General instructions  Do not take baths, swim, or use a hot tub until your health care provider approves. Ask your health care provider if you may take showers. You may only be allowed to take sponge baths.  Monitor your dialysis fistula closely. Check to make sure that you can feel a vibration or buzz (a thrill) when you put your fingers over the fistula.  Prevent damage to your graft or fistula: ? Do not wear tight-fitting clothing or jewelry on the arm or leg that has your graft or fistula. ? Tell all your health care providers that you have a dialysis fistula or graft. ? Do not allow blood draws, IVs, or blood pressure readings to be done in the arm that has your fistula or graft. ? Do not allow flu shots or vaccinations in the arm with your fistula or graft.  Keep all follow-up visits as told by your health care provider. This is important. Contact a health care provider if:  You have redness, swelling, or pain at the site where the catheter was put in.  You have fluid or blood coming from the catheter site.  The catheter site feels warm to the touch.  You have pus or a bad smell coming from the catheter site.  You have a fever or chills. Get help right away if:  You feel weak.  You have  trouble balancing.  You have trouble moving your arms or legs.  You have problems with your speech or vision.  You can no longer feel a vibration or buzz when you put your fingers over your dialysis fistula.  The limb that was used for the procedure: ? Swells. ? Is painful. ? Is cold. ? Is discolored, such as blue or pale white.  You have chest pain or shortness of breath. Summary  After a dialysis fistulogram, it is common to have a small amount of discomfort or bruising in the area where the small, thin tube (catheter) was placed.  Rest  at home on the day after your procedure. Return to your normal activities as told by your health care provider.  Take over-the-counter and prescription medicines only as told by your health care provider.  Follow instructions from your health care provider about how to take care of the site where the catheter was inserted.  Keep all follow-up visits as told by your health care provider. This information is not intended to replace advice given to you by your health care provider. Make sure you discuss any questions you have with your health care provider. Document Revised: 10/28/2017 Document Reviewed: 10/28/2017 Elsevier Patient Education  2020 [employer].

## 2020-03-14 NOTE — Interval H&P Note (Signed)
History and Physical Interval Note:  03/14/2020 8:25 AM  Terry Graves.  has presented today for surgery, with the diagnosis of instage renal.  The various methods of treatment have been discussed with the patient and family. After consideration of risks, benefits and other options for treatment, the patient has consented to  Procedure(s): A/V FISTULAGRAM - left arm (N/A) as a surgical intervention.  The patient's history has been reviewed, patient examined, no change in status, stable for surgery.  I have reviewed the patient's chart and labs.  Questions were answered to the patient's satisfaction.     Ruta Hinds

## 2020-03-16 IMAGING — DX DG HIP (WITH OR WITHOUT PELVIS) 2-3V LEFT
3 series · 3 of 3 positions shown · non-contrast
Comparison: None.

CLINICAL DATA: Left hip pain

EXAM:
DG HIP (WITH OR WITHOUT PELVIS) 2-3V LEFT

[pelvis ap]
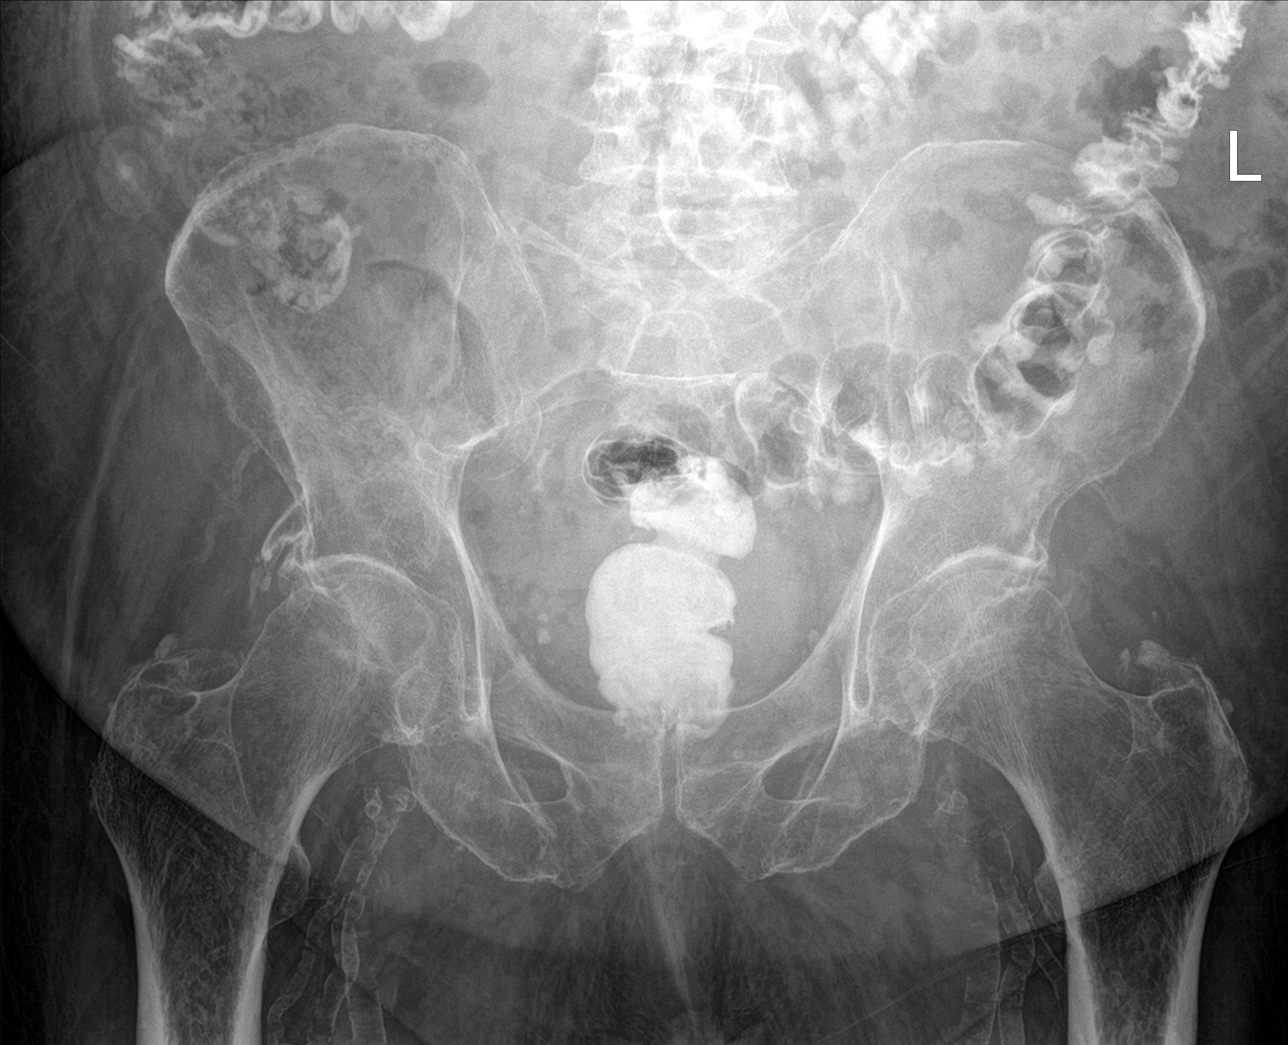

[hip ap]
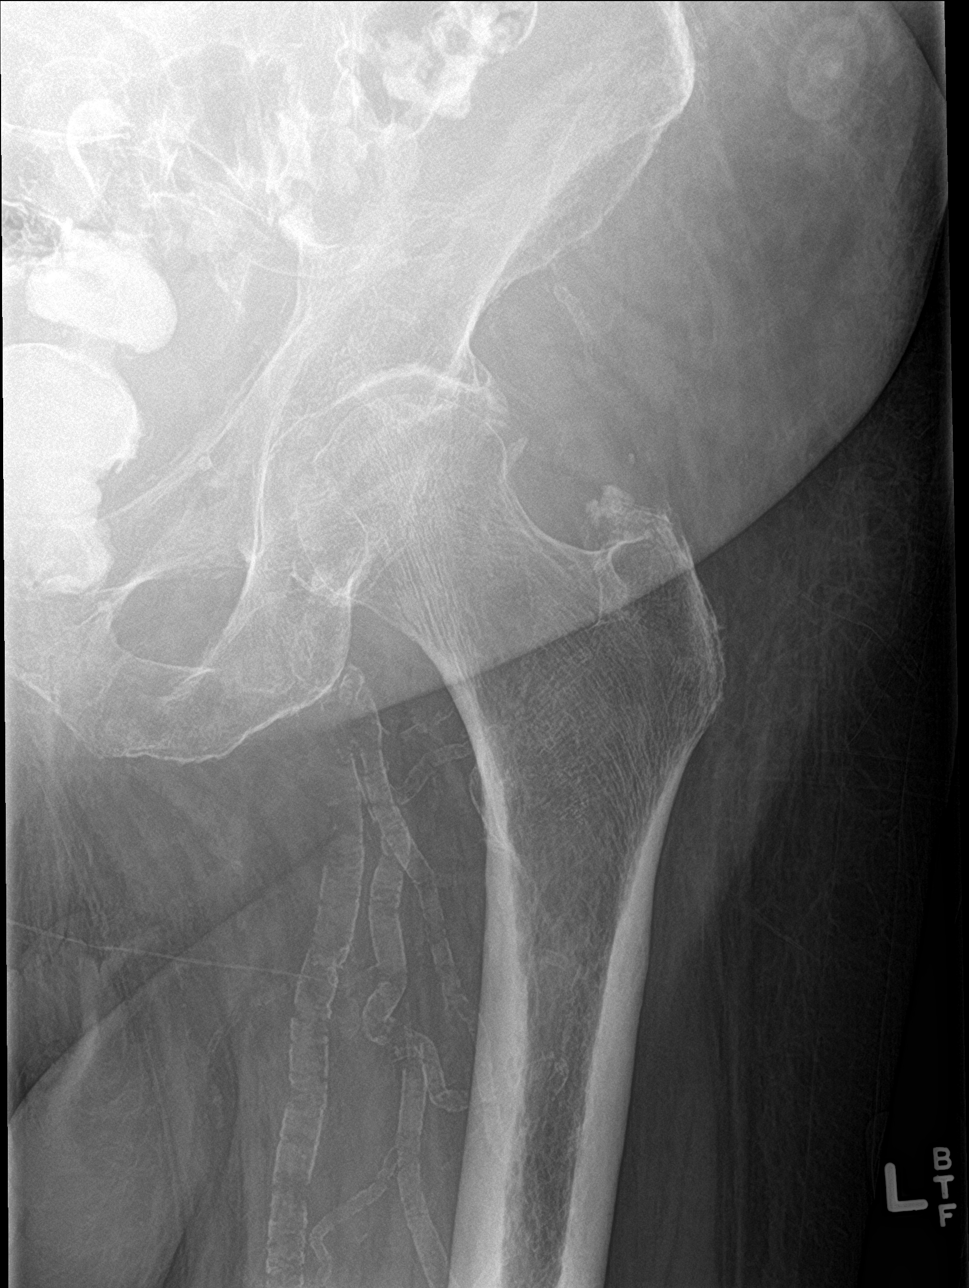

[hip lat]
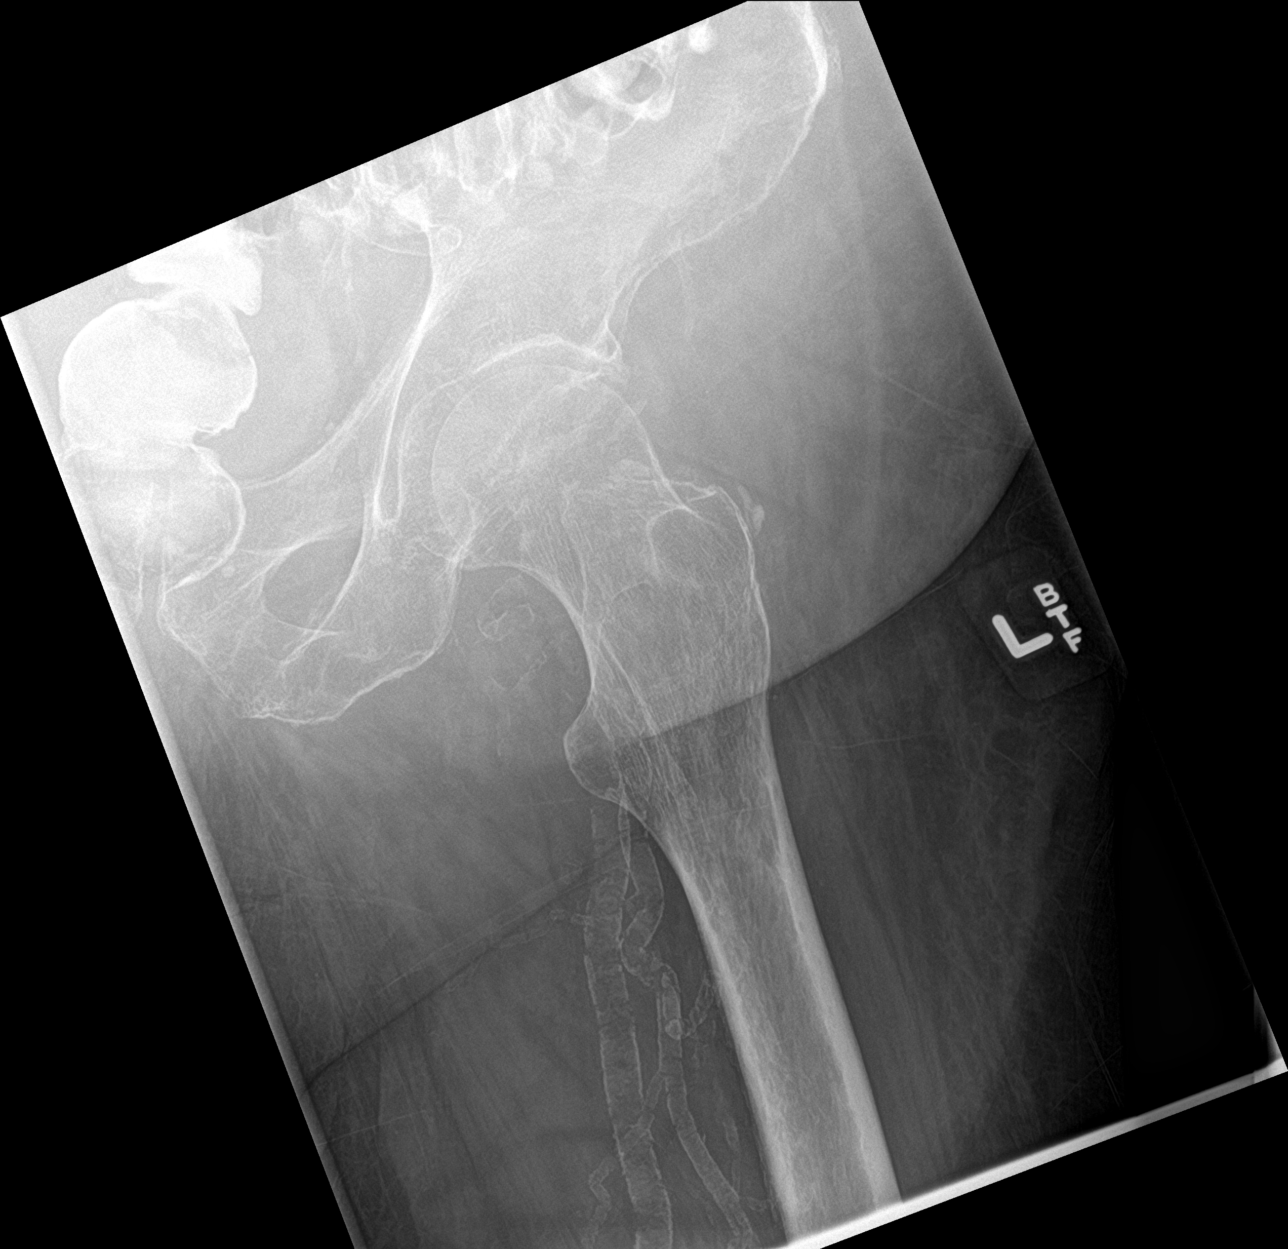

[3 of 3 positions shown; findings below may reference images not displayed]

FINDINGS: Moderate osteoarthritis changes in the hips bilaterally with joint
space narrowing and spurring. Diffuse osteopenia. No acute bony
abnormality. Specifically, no fracture, subluxation, or dislocation.
Diffuse vascular calcifications.
IMPRESSION: Moderate degenerative changes in the hips bilaterally. No acute bony
abnormality.

## 2020-03-21 ENCOUNTER — Other Ambulatory Visit: Payer: Self-pay

## 2020-03-21 ENCOUNTER — Telehealth: Payer: Self-pay

## 2020-03-21 NOTE — Telephone Encounter (Signed)
Returned Wanda's call to schedule pt for surgery. Pre op instructions given-NPO after MDN, hold Xarelto x3 days, covid screening set up. No further questions/concerns.

## 2020-03-26 ENCOUNTER — Other Ambulatory Visit (HOSPITAL_COMMUNITY)
Admission: RE | Admit: 2020-03-26 | Discharge: 2020-03-26 | Disposition: A | Discharge status: Home / Self Care | Payer: Medicare Other | Source: Ambulatory Visit | Attending: Vascular Surgery | Admitting: Vascular Surgery

## 2020-03-26 ENCOUNTER — Encounter (HOSPITAL_COMMUNITY): Payer: Self-pay | Admitting: Vascular Surgery

## 2020-03-26 ENCOUNTER — Other Ambulatory Visit: Payer: Self-pay

## 2020-03-26 DIAGNOSIS — Z01812 Encounter for preprocedural laboratory examination: Secondary | ICD-10-CM | POA: Diagnosis present

## 2020-03-26 DIAGNOSIS — Z20822 Contact with and (suspected) exposure to covid-19: Secondary | ICD-10-CM | POA: Insufficient documentation

## 2020-03-26 LAB — SARS CORONAVIRUS 2 (TAT 6-24 HRS): SARS Coronavirus 2: NEGATIVE

## 2020-03-26 NOTE — Progress Notes (Signed)
SDW-pre-op call completed by both pt and pt daughter, Mariann Laster, as requested by pt. Pt denies any acute cardiopulmonary issues. Pt stated that he is under the care of Dr. Rosalita Chessman, Cardiology and Dr. Valentina Shaggy, PCP. Pt denies having a cardiac cath. Pt stated that last dose of Xarelto was Monday night as instructed by MD. Pt daughter made aware to have pt stop taking vitamins, fish oil and herbal medications. Do not take any NSAIDs ie: Ibuprofen, Advil, Naproxen (Aleve), Motrin, BC and Goody Powder. Daughter made aware to have pt check CBG every 2 hours prior to arrival to hospital on DOS. Daughter made aware to treat a CBG < 70 with 4 ounces of apple or cranberry juice, wait 15 minutes after intervention to recheck CBG, if CBG remains < 70, call Short Stay unit to speak with a nurse. Daughter reminded to have pt quarantine. Daughter verbalized understanding of all pre-op instructions. PA, Anesthesiology, asked to review pt cardiac history.

## 2020-03-26 NOTE — Progress Notes (Signed)
Anesthesia Chart Review: Terry Graves   Case: [id number] Date/Time: 03/28/20 0715   Procedure: SIDE BRANCH LIGATION OF LEFT ARM  ARTERIOVENOUS FISTULA (Left )   Anesthesia type: Monitor Anesthesia Care   Pre-op diagnosis: MECHANICAL COMPLICATION, AVF   Location: [facility] OR ROOM 12 / [place] [facility]   Surgeons: Marty Heck, MD      DISCUSSION:  Patient is an 85 year old male scheduled for the above procedure. He is s/p left brachiocephalic AVF creation and placement of right IJ tunneled dialysis catheter on 01/01/20. He is now on hemodialysis TTS.  History includes smoking, ESRD, HTN, DM2, HLD, afib, CHF, OSA (CPAP), hypothyroidism, PVD, anemia (s/p 3 Units PRBC 04/2019, EGD: gastritis, nonbleeding gastric ulcer), falls (questionable nondisplaced impacted fracture involving the superior base of the left femoral neck 04/10/19 CT, [facility]; no hip fracture noted 04/26/19 xray), hard of hearing. Probable small PFO with left-to-right shunt on 04/26/19 echo.  Cardiology records ([facility]) from 11/19/19 received prior to March surgery (scanned under Media tab, Correspondence, 01/01/20). That visit was with Valorie Roosevelt, NP-C for afib and cardiomyopathy follow-up. Metoprolol changed to carvedilol for cardiomyopathy. Notes indicate known OM stenosis (75% 2002) otherwise no significant CAD. Recent adenosine stress test showed lateral wall ischemia, but unchanged from 2016. No chest pain, so not plans to re-cath at that time. Discussed consideration of AICD if LVEF remained depressed on follow-up echo (although EF 55-60% on 12/11/19 follow-up echo). Afib was rate controlled. Edema remained an issue but worsening CKD felt to be a contributing factor as he had not started hemodialysis at that time. (Requested any additional records from [facility] if seen since February 2021.)  He denied any acute cardiopulmonary issues per PAT RN phone interview.  Last Xarelto 03/24/20. 03/26/20 presurgical COVID-19 test negative.  Anesthesia team to evaluate on the day of surgery.    VS:  BP Readings from Last 3 Encounters:  03/14/20 (!) 152/88  03/07/20 130/69  02/06/20 (!) 110/56   Pulse Readings from Last 3 Encounters:  03/14/20 93  03/07/20 85  02/06/20 62    PROVIDERS: [name], MD is listed as PCP Ulice Bold, MD is neprhrologist ([facility] Everywhere) Lona Kettle, MD is cardiologist ([place])   LABS: He is for ISTAT labs on arrival. As of 03/20/20 ([place]), H/H 12.5/37.5.    IMAGES: 1V PCXR 01/01/20: FINDINGS: - There is a dual lumen right-sided central venous catheter in satisfactory position. There is mild bilateral diffuse interstitial thickening. There is no pleural effusion or pneumothorax. There is stable cardiomegaly. - There is no acute osseous abnormality. IMPRESSION: Cardiomegaly with mild pulmonary vascular congestion. Dual lumen right-sided central venous catheter in satisfactory position.   EKG: EKG 11/19/19 ([facility]; scanned under Media tab, Correspondence, 01/01/20): Possible atrial fibrillation at 83 bpm Right bundle branch block  EKG 04/26/19:  V-rate 123 bpm Atrial fibrillation with rapid ventricular response Incomplete right bundle branch block Nonspecific ST abnormality Abnormal ECG Confirmed by Asencion Noble [phone]) on 04/29/2019 11:43:11 PM   CV: Echo 12/11/19 ([facility]; scanned under Media tab, Correspondence, 01/01/20): Impression: Poorly seen LV with normal systolic function. The ejection fraction is 55-60%. There is mildly increased left ventricular wall thickness.  Patient is in atrial fibrillation. Normal right ventricular size. The left atrium is severely dilated.  There is moderate mitral regurgitation. There is moderate tricuspid regurgitation. The pulmonary artery is not well seen. (Comparison echo 04/26/19 in setting of severe anemia: LVEF  35-40%, moderate-severe TR, pulmonary  hypertension with severely elevated PASP 92 mmHg, probable small PFO with left-to-right-shunt.)  Nuclear stress tet 11/14/19 ([facility]; scanned under Media tab, Correspondence, 01/01/20): Overall: This vasodilator stress test is positive for ischemia.  LVEF 72%. Perfusion imaging: There is a small reversible perfusion abnormality of mild intensity in the lateral wall from the mid-wall to the apex.  The remainder of the ventricle has normal perfusion at rest and with stress. Wall motion: Gated SPECT demonstrates normal myocardial thickening and wall motion.  The LV cavity size is normal at rest and with stress, is unchanged. Impression: Abnormal. - By cardiology notes, results felt stable when compared to 2016 stress test. No chest pain. Plan for continued medical therapy recommended as of 11/19/19, particularly given worsening CKD.  Additional testing outlined in 11/19/19 [facility] note; scanned under Media tab, Correspondence, 01/01/20): "[facility], 2002 Cath revealed 75% stenosis in the OM" "07-31-15 Lexiscan: EF 65/lateral wall ischemia 11/14/2019 Adenosine MPI: EF 72%, lateral wall ischemia, unchanged from 2016"   Past Medical History:  Diagnosis Date  . Anemia   . Arthritis   . Atrial fibrillation ([place])   . CHF (congestive heart failure) ([place])   . Chronic kidney disease    Stage 5  . Chronic kidney disease-mineral and bone disorder   . Headache   . HOH (hard of hearing)   . Hyperlipidemia   . Hypertension   . Hypothyroidism   . Peripheral vascular disease ([place])   . Proteinuria   . Sleep apnea    wears CPAP 12  . Type 2 diabetes mellitus with stage 5 chronic kidney disease ([place])   . Wears glasses     Past Surgical History:  Procedure Laterality Date  . A/V FISTULAGRAM N/A 03/14/2020   Procedure: A/V FISTULAGRAM - left arm;  Surgeon: Elam Dutch, MD;  Location: [facility];  Service: Cardiovascular;  Laterality: N/A;  . AV FISTULA PLACEMENT Left 01/01/2020   Procedure: LEFT  ARM ARTERIOVENOUS (AV) FISTULA CREATION;  Surgeon: Angelia Mould, MD;  Location: [facility];  Service: Vascular;  Laterality: Left;  . CATARACT EXTRACTION W/ INTRAOCULAR LENS  IMPLANT, BILATERAL    . COLONOSCOPY    . ESOPHAGOGASTRODUODENOSCOPY N/A 04/25/2019   Procedure: ESOPHAGOGASTRODUODENOSCOPY (EGD);  Surgeon: [name], MD;  [facility];  Service: Endoscopy;  Laterality: N/A;  . HIP ARTHROSCOPY Left   . INSERTION OF DIALYSIS CATHETER Right 01/01/2020   Procedure: INSERTION OF RIGHT INTERNAL JUGULAR DIALYSIS CATHETER;  Surgeon: Angelia Mould, MD;  Location[facility];  Service: Vascular;  Laterality: Right;    MEDICATIONS: . 0.9 %  sodium chloride infusion  . sodium chloride flush (NS) 0.9 % injection 3 mL  . sodium chloride flush (NS) 0.9 % injection 3 mL   . acetaminophen (TYLENOL) 650 MG CR tablet  . atorvastatin (LIPITOR) 40 MG tablet  . carvedilol (COREG) 12.5 MG tablet  . docusate sodium (COLACE) 100 MG capsule  . levothyroxine (SYNTHROID) 25 MCG tablet  . Methoxy PEG-Epoetin Beta (MIRCERA IJ)  . Multiple Vitamin (DAILY VITE PO)  . nitroGLYCERIN (NITROSTAT) 0.4 MG SL tablet  . pantoprazole (PROTONIX) 40 MG tablet  . rOPINIRole (REQUIP) 0.5 MG tablet  . terazosin (HYTRIN) 1 MG capsule  . XARELTO 15 MG TABS tablet    Myra Gianotti, PA-C Surgical Short Stay/Anesthesiology [facility] Phone [phone] [facility] Phone [phone] 03/27/2020 9:24 AM

## 2020-03-27 NOTE — Anesthesia Preprocedure Evaluation (Addendum)
Anesthesia Evaluation  Patient identified by MRN, date of birth, ID band Patient awake    Reviewed: Allergy & Precautions, NPO status , Patient's Chart, lab work & pertinent test results  History of Anesthesia Complications Negative for: history of anesthetic complications  Airway Mallampati: II  TM Distance: >3 FB Neck ROM: Full    Dental  (+) Dental Advisory Given, Chipped, Poor Dentition,    Pulmonary sleep apnea and Continuous Positive Airway Pressure Ventilation , Current SmokerPatient did not abstain from smoking.,    Pulmonary exam normal breath sounds clear to auscultation       Cardiovascular hypertension, Pt. on home beta blockers (-) angina+ Peripheral Vascular Disease and +CHF  Normal cardiovascular exam+ dysrhythmias Atrial Fibrillation  Rhythm:Regular Rate:Normal     Neuro/Psych  Headaches, negative psych ROS   GI/Hepatic GERD  Medicated,  Endo/Other  diabetes, Well Controlled, Type 2Hypothyroidism   Renal/GU ESRF and DialysisRenal disease (K+ 3.8)[id number] COMPLICATION, AVF     Musculoskeletal  (+) Arthritis ,   Abdominal   Peds  Hematology  (+) Blood dyscrasia (Xarelto), ,   Anesthesia Other Findings   Reproductive/Obstetrics                            Anesthesia Physical Anesthesia Plan  ASA: III  Anesthesia Plan: MAC   Post-op Pain Management:    Induction: Intravenous  PONV Risk Score and Plan: 0 and Propofol infusion and Treatment may vary due to age or medical condition  Airway Management Planned: Natural Airway and Nasal Cannula  Additional Equipment:   Intra-op Plan:   Post-operative Plan:   Informed Consent: I have reviewed the patients History and Physical, chart, labs and discussed the procedure including the risks, benefits and alternatives for the proposed anesthesia with the patient or authorized representative who has indicated his/her  understanding and acceptance.     Dental advisory given  Plan Discussed with: CRNA  Anesthesia Plan Comments: (PAT note written 03/27/2020 by Myra Gianotti, PA-C. )      Anesthesia Quick Evaluation

## 2020-03-28 ENCOUNTER — Encounter (HOSPITAL_COMMUNITY): Payer: Self-pay | Admitting: Vascular Surgery

## 2020-03-28 ENCOUNTER — Ambulatory Visit (HOSPITAL_COMMUNITY): Payer: Medicare Other | Admitting: Vascular Surgery

## 2020-03-28 ENCOUNTER — Other Ambulatory Visit: Payer: Self-pay

## 2020-03-28 ENCOUNTER — Ambulatory Visit (HOSPITAL_COMMUNITY)
Admission: RE | Admit: 2020-03-28 | Discharge: 2020-03-28 | Disposition: A | Discharge status: Home / Self Care | Payer: Medicare Other | Attending: Vascular Surgery | Admitting: Vascular Surgery

## 2020-03-28 ENCOUNTER — Encounter (HOSPITAL_COMMUNITY)
Admission: RE | Disposition: A | Discharge status: Home / Self Care | Payer: Self-pay | Source: Home / Self Care | Attending: Vascular Surgery

## 2020-03-28 DIAGNOSIS — Z7901 Long term (current) use of anticoagulants: Secondary | ICD-10-CM | POA: Insufficient documentation

## 2020-03-28 DIAGNOSIS — N186 End stage renal disease: Secondary | ICD-10-CM | POA: Insufficient documentation

## 2020-03-28 DIAGNOSIS — T82898A Other specified complication of vascular prosthetic devices, implants and grafts, initial encounter: Secondary | ICD-10-CM | POA: Diagnosis not present

## 2020-03-28 DIAGNOSIS — Y841 Kidney dialysis as the cause of abnormal reaction of the patient, or of later complication, without mention of misadventure at the time of the procedure: Secondary | ICD-10-CM | POA: Diagnosis not present

## 2020-03-28 DIAGNOSIS — Z992 Dependence on renal dialysis: Secondary | ICD-10-CM | POA: Insufficient documentation

## 2020-03-28 DIAGNOSIS — Z79890 Hormone replacement therapy (postmenopausal): Secondary | ICD-10-CM | POA: Diagnosis not present

## 2020-03-28 DIAGNOSIS — T82510A Breakdown (mechanical) of surgically created arteriovenous fistula, initial encounter: Secondary | ICD-10-CM | POA: Insufficient documentation

## 2020-03-28 DIAGNOSIS — Z79899 Other long term (current) drug therapy: Secondary | ICD-10-CM | POA: Insufficient documentation

## 2020-03-28 HISTORY — DX: Unspecified hearing loss, unspecified ear: H91.90

## 2020-03-28 HISTORY — PX: LIGATION OF COMPETING BRANCHES OF ARTERIOVENOUS FISTULA: SHX5949

## 2020-03-28 LAB — POCT I-STAT, CHEM 8
BUN: 35 mg/dL — ABNORMAL HIGH (ref 8–23)
Calcium, Ion: 1.02 mmol/L — ABNORMAL LOW (ref 1.15–1.40)
Chloride: 92 mmol/L — ABNORMAL LOW (ref 98–111)
Creatinine, Ser: 4.5 mg/dL — ABNORMAL HIGH (ref 0.61–1.24)
Glucose, Bld: 174 mg/dL — ABNORMAL HIGH (ref 70–99)
HCT: 41 % (ref 39.0–52.0)
Hemoglobin: 13.9 g/dL (ref 13.0–17.0)
Potassium: 3.3 mmol/L — ABNORMAL LOW (ref 3.5–5.1)
Sodium: 133 mmol/L — ABNORMAL LOW (ref 135–145)
TCO2: 30 mmol/L (ref 22–32)

## 2020-03-28 LAB — PROTIME-INR
INR: 1.1 (ref 0.8–1.2)
Prothrombin Time: 14.1 seconds (ref 11.4–15.2)

## 2020-03-28 LAB — GLUCOSE, CAPILLARY
Glucose-Capillary: 163 mg/dL — ABNORMAL HIGH (ref 70–99)
Glucose-Capillary: 174 mg/dL — ABNORMAL HIGH (ref 70–99)

## 2020-03-28 SURGERY — LIGATION OF COMPETING BRANCHES OF ARTERIOVENOUS FISTULA
Anesthesia: Monitor Anesthesia Care | Site: Arm Upper | Laterality: Left

## 2020-03-28 MED ORDER — LIDOCAINE 2% (20 MG/ML) 5 ML SYRINGE
INTRAMUSCULAR | Status: AC
  Filled 2020-03-28: qty 5

## 2020-03-28 MED ORDER — FENTANYL CITRATE (PF) 250 MCG/5ML IJ SOLN
INTRAMUSCULAR | Status: AC
  Filled 2020-03-28: qty 5

## 2020-03-28 MED ORDER — SODIUM CHLORIDE 0.9 % IV SOLN: INTRAVENOUS | Status: DC

## 2020-03-28 MED ORDER — ONDANSETRON HCL 4 MG/2ML IJ SOLN: 4.0000 mg | Freq: Once | INTRAMUSCULAR | Status: DC | PRN

## 2020-03-28 MED ORDER — CHLORHEXIDINE GLUCONATE 4 % EX LIQD: 60.0000 mL | Freq: Once | CUTANEOUS | Status: DC

## 2020-03-28 MED ORDER — LIDOCAINE HCL 1 % IJ SOLN
INTRAMUSCULAR | Status: DC | PRN
  Administered 2020-03-28: 8 mL

## 2020-03-28 MED ORDER — CHLORHEXIDINE GLUCONATE 0.12 % MT SOLN
OROMUCOSAL | Status: AC
  Administered 2020-03-28: 15 mL
  Filled 2020-03-28: qty 15

## 2020-03-28 MED ORDER — VANCOMYCIN HCL IN DEXTROSE 1-5 GM/200ML-% IV SOLN
1000.0000 mg | INTRAVENOUS | Status: AC
  Administered 2020-03-28: 1000 mg via INTRAVENOUS
  Filled 2020-03-28: qty 200

## 2020-03-28 MED ORDER — LIDOCAINE 2% (20 MG/ML) 5 ML SYRINGE
INTRAMUSCULAR | Status: DC | PRN
  Administered 2020-03-28: 40 mg via INTRAVENOUS

## 2020-03-28 MED ORDER — PROPOFOL 1000 MG/100ML IV EMUL
INTRAVENOUS | Status: AC
  Filled 2020-03-28: qty 100

## 2020-03-28 MED ORDER — PHENYLEPHRINE 40 MCG/ML (10ML) SYRINGE FOR IV PUSH (FOR BLOOD PRESSURE SUPPORT)
PREFILLED_SYRINGE | INTRAVENOUS | Status: DC | PRN
  Administered 2020-03-28: 80 ug via INTRAVENOUS

## 2020-03-28 MED ORDER — FENTANYL CITRATE (PF) 100 MCG/2ML IJ SOLN: 25.0000 ug | INTRAMUSCULAR | Status: DC | PRN

## 2020-03-28 MED ORDER — PROPOFOL 500 MG/50ML IV EMUL
INTRAVENOUS | Status: DC | PRN
  Administered 2020-03-28: 50 ug/kg/min via INTRAVENOUS

## 2020-03-28 MED ORDER — PROPOFOL 10 MG/ML IV BOLUS
INTRAVENOUS | Status: AC
  Filled 2020-03-28: qty 20

## 2020-03-28 MED ORDER — HYDROCODONE-ACETAMINOPHEN 5-325 MG PO TABS: 1.0000 | ORAL_TABLET | ORAL | 0 refills | Status: AC | PRN

## 2020-03-28 MED ORDER — LIDOCAINE HCL (PF) 1 % IJ SOLN
INTRAMUSCULAR | Status: AC
  Filled 2020-03-28: qty 30

## 2020-03-28 MED ORDER — 0.9 % SODIUM CHLORIDE (POUR BTL) OPTIME
TOPICAL | Status: DC | PRN
  Administered 2020-03-28: 1000 mL

## 2020-03-28 MED ORDER — ACETAMINOPHEN 500 MG PO TABS
1000.0000 mg | ORAL_TABLET | Freq: Once | ORAL | Status: AC
  Administered 2020-03-28: 1000 mg via ORAL
  Filled 2020-03-28: qty 2

## 2020-03-28 SURGICAL SUPPLY — 32 items
ARMBAND PINK RESTRICT EXTREMIT (MISCELLANEOUS) ×2 IMPLANT
CANISTER SUCT 3000ML PPV (MISCELLANEOUS) ×2 IMPLANT
CLIP VESOCCLUDE MED 24/CT (CLIP) ×2 IMPLANT
CLIP VESOCCLUDE SM WIDE 24/CT (CLIP) ×2 IMPLANT
COVER PROBE W GEL 5X96 (DRAPES) ×2 IMPLANT
COVER WAND RF STERILE (DRAPES) ×2 IMPLANT
DERMABOND ADVANCED (GAUZE/BANDAGES/DRESSINGS) ×1
DERMABOND ADVANCED .7 DNX12 (GAUZE/BANDAGES/DRESSINGS) ×1 IMPLANT
ELECT REM PT RETURN 9FT ADLT (ELECTROSURGICAL) ×2
ELECTRODE REM PT RTRN 9FT ADLT (ELECTROSURGICAL) ×1 IMPLANT
GLOVE BIO SURGEON STRL SZ7.5 (GLOVE) ×2 IMPLANT
GLOVE BIOGEL PI IND STRL 8 (GLOVE) ×1 IMPLANT
GLOVE BIOGEL PI INDICATOR 8 (GLOVE) ×1
GOWN STRL REUS W/ TWL LRG LVL3 (GOWN DISPOSABLE) ×2 IMPLANT
GOWN STRL REUS W/ TWL XL LVL3 (GOWN DISPOSABLE) ×2 IMPLANT
GOWN STRL REUS W/TWL LRG LVL3 (GOWN DISPOSABLE) ×2
GOWN STRL REUS W/TWL XL LVL3 (GOWN DISPOSABLE) ×2
HEMOSTAT SPONGE AVITENE ULTRA (HEMOSTASIS) IMPLANT
KIT BASIN OR (CUSTOM PROCEDURE TRAY) ×2 IMPLANT
KIT TURNOVER KIT B (KITS) ×2 IMPLANT
NS IRRIG 1000ML POUR BTL (IV SOLUTION) ×2 IMPLANT
PACK CV ACCESS (CUSTOM PROCEDURE TRAY) ×2 IMPLANT
PAD ARMBOARD 7.5X6 YLW CONV (MISCELLANEOUS) ×4 IMPLANT
SUT ETHILON 3 0 PS 1 (SUTURE) IMPLANT
SUT MNCRL AB 4-0 PS2 18 (SUTURE) ×4 IMPLANT
SUT PROLENE 6 0 BV (SUTURE) IMPLANT
SUT SILK 0 TIES 10X30 (SUTURE) ×2 IMPLANT
SUT VIC AB 3-0 SH 27 (SUTURE) ×1
SUT VIC AB 3-0 SH 27X BRD (SUTURE) ×1 IMPLANT
TOWEL GREEN STERILE (TOWEL DISPOSABLE) ×2 IMPLANT
UNDERPAD 30X36 HEAVY ABSORB (UNDERPADS AND DIAPERS) ×2 IMPLANT
WATER STERILE IRR 1000ML POUR (IV SOLUTION) ×2 IMPLANT

## 2020-03-28 NOTE — Anesthesia Postprocedure Evaluation (Signed)
Anesthesia Post Note  Patient: Terry Graves.  Procedure(s) Performed: SIDE BRANCH LIGATION TIMES THREE OF LEFT ARM  ARTERIOVENOUS FISTULA (Left Arm Upper)     Patient location during evaluation: PACU Anesthesia Type: MAC Level of consciousness: awake and alert, awake and oriented Pain management: pain level controlled Vital Signs Assessment: post-procedure vital signs reviewed and stable Respiratory status: spontaneous breathing, nonlabored ventilation and respiratory function stable Cardiovascular status: stable and blood pressure returned to baseline Postop Assessment: no apparent nausea or vomiting Anesthetic complications: no   No complications documented.  Last Vitals:  Vitals:   03/28/20 0905 03/28/20 0915  BP: (!) 102/48 114/60  Pulse: 61 (!) 58  Resp: (!) 23 19  Temp:  (!) 36.1 C  SpO2: 100% 95%    Last Pain:  Vitals:   03/28/20 0915  PainSc: 0-No pain                 Catalina Gravel

## 2020-03-28 NOTE — Transfer of Care (Signed)
Immediate Anesthesia Transfer of Care Note  Patient: Terry Graves.  Procedure(s) Performed: SIDE BRANCH LIGATION TIMES THREE OF LEFT ARM  ARTERIOVENOUS FISTULA (Left Arm Upper)  Patient Location: PACU  Anesthesia Type:MAC  Level of Consciousness: awake, alert  and oriented  Airway & Oxygen Therapy: Patient Spontanous Breathing  Post-op Assessment: Report given to RN  Post vital signs: Reviewed and stable  Last Vitals:  Vitals Value Taken Time  BP 101/58 03/28/20 0847  Temp    Pulse 71 03/28/20 0847  Resp 18 03/28/20 0847  SpO2 100 % 03/28/20 0847  Vitals shown include unvalidated device data.  Last Pain:  Vitals:   03/28/20 0645  PainSc: 0-No pain      Patients Stated Pain Goal: 3 (55/73/22 0254)  Complications: No complications documented.

## 2020-03-28 NOTE — Discharge Instructions (Signed)
[facility]  Discharge Instructions  AV Fistula or Graft Surgery for Dialysis Access  Please refer to the following instructions for your post-procedure care. Your surgeon or physician assistant will discuss any changes with you.  Activity  You may drive the day following your surgery, if you are comfortable and no longer taking prescription pain medication. Resume full activity as the soreness in your incision resolves.  Bathing/Showering  You may shower after you go home. Keep your incision dry for 48 hours. Do not soak in a bathtub, hot tub, or swim until the incision heals completely. You may not shower if you have a hemodialysis catheter.  Incision Care  Clean your incision with mild soap and water after 48 hours. Pat the area dry with a clean towel. You do not need a bandage unless otherwise instructed. Do not apply any ointments or creams to your incision. You may have skin glue on your incision. Do not peel it off. It will come off on its own in about one week. Your arm may swell a bit after surgery. To reduce swelling use pillows to elevate your arm so it is above your heart. Your doctor will tell you if you need to lightly wrap your arm with an ACE bandage.  Diet  Resume your normal diet. There are not special food restrictions following this procedure. In order to heal from your surgery, it is CRITICAL to get adequate nutrition. Your body requires vitamins, minerals, and protein. Vegetables are the best source of vitamins and minerals. Vegetables also provide the perfect balance of protein. Processed food has little nutritional value, so try to avoid this.  Medications  Resume taking all of your medications. If your incision is causing pain, you may take over-the counter pain relievers such as acetaminophen (Tylenol). If you were prescribed a stronger pain medication, please be aware these medications can cause nausea and constipation. Prevent  nausea by taking the medication with a snack or meal. Avoid constipation by drinking plenty of fluids and eating foods with high amount of fiber, such as fruits, vegetables, and grains.  Do not take Tylenol if you are taking prescription pain medications.  Follow up Your surgeon may want to see you in the office following your access surgery. If so, this will be arranged at the time of your surgery.  Please call us immediately for any of the following conditions:  . Increased pain, redness, drainage (pus) from your incision site . Fever of 101 degrees or higher . Severe or worsening pain at your incision site . Hand pain or numbness. .  Reduce your risk of vascular disease:  . Stop smoking. If you would like help, call [facility] at 1-800-QUIT-NOW (682)381-9816) or [place] at [phone]  . Manage your cholesterol . Maintain a desired weight . Control your diabetes . Keep your blood pressure down  Dialysis  It will take several weeks to several months for your new dialysis access to be ready for use. Your surgeon will determine when it is okay to use it. Your nephrologist will continue to direct your dialysis. You can continue to use your Permcath until your new access is ready for use.   03/28/2020 Terry Graves [id number] 08-05-1935  Surgeon(s): Marty Heck, MD  Procedure(s): SIDE [facility] LIGATION TIMES THREE OF LEFT ARM  ARTERIOVENOUS FISTULA   May stick graft immediately   May stick graft on designated area only:   X Do not stick left AV  fistula for 4-6 weeks    If you have any questions, please call the office at [phone].

## 2020-03-28 NOTE — Op Note (Signed)
Date: March 28, 2020  Preoperative diagnosis: Slow to mature left brachiocephalic AV fistula  Postoperative diagnosis: Same  Procedure: Left upper extremity AV fistula revision with sidebranch ligation x3  Surgeon: Dr. Marty Heck, MD  Assistant: Paulo Fruit, PA  Indication: Patient is a 85 year old male who underwent a left brachiocephalic AV fistula by Dr. Scot Dock on 01/01/2020.  Ultimately this was slow to mature on follow-up and he underwent fistulogram with Dr. Oneida Alar.  Dr. Oneida Alar then recommended sidebranch ligation to facilitate maturation of the fistula.  He presents today after risk benefits discussed.  Findings: Three side branches were identified throughout the distal mid and and proximal upper arm.  Three small transverse incisions were made over the fistula and all these side branches were ligated and divided.  Appreciable thrill upon completion.  Anesthesia: MAC  Details: Patient was taken to the operating room after informed consent was obtained.  Placed on operative table supine position and left arm was prepped draped in usual sterile fashion.  Timeout was performed.  Sterile ultrasound was then used to evaluate the left brachiocephalic fistula throughout its length.  Three side branches were identified and marked over the course of the fistula.  Ultimately 1% lidocaine was injected over each area of the fistula where the side branches were identified and three small transverse incisions were made over each branch point.  Dissected down with Bovie cautery and ultimately identified each branch individually and the branches were ligated using a right angle clamp and 3-0 silk ties and divided.  Ultimately there was a thrill upon completion.  Each incision was irrigated out and then closed with a running 4-0 Monocryl and Dermabond.  Taken to PACU in stable condition.  Complication: None  Condition: Stable  Marty Heck, MD [facility]  [place] Office: [place]

## 2020-03-28 NOTE — H&P (Signed)
History and Physical Interval Note:  03/28/2020 7:28 AM  Terry Graves.  has presented today for surgery, with the diagnosis of MECHANICAL COMPLICATION, AVF.  The various methods of treatment have been discussed with the patient and family. After consideration of risks, benefits and other options for treatment, the patient has consented to  Procedure(s): [place] (Left) as a surgical intervention.  The patient's history has been reviewed, patient examined, no change in status, stable for surgery.  I have reviewed the patient's chart and labs.  Questions were answered to the patient's satisfaction.    Left arm AVF revision with side branch ligation after fistulogram with Dr. Oneida Alar.  Terry Graves  Established Dialysis Access   History of Present Illness   Terry Graves. is a 85 y.o. (1934/12/22) male who presents for re-evaluation of left arm fistula.  Left brachiocephalic fistula was created by Dr. Scot Dock on 01/01/2020.  He denies signs or symptoms of steal syndrome left hand.  He is dialyzing via right IJ [facility] on a Tuesday Thursday Saturday schedule.  He is on Xarelto.          Current Outpatient Medications  Medication Sig Dispense Refill  . acetaminophen (TYLENOL) 650 MG CR tablet Take 1,300 mg by mouth in the morning and at bedtime.    Marland Kitchen atorvastatin (LIPITOR) 40 MG tablet Take 40 mg by mouth every evening.     . carvedilol (COREG) 12.5 MG tablet Take 12.5 mg by mouth 2 (two) times daily.    . Darbepoetin Alfa (ARANESP) 60 MCG/0.3ML SOSY injection Inject 60 mcg into the skin every 14 (fourteen) days.    Marland Kitchen levothyroxine (SYNTHROID) 25 MCG tablet Take 25 mcg by mouth daily before breakfast.    . nitroGLYCERIN (NITROSTAT) 0.4 MG SL tablet Place 0.4 mg under the tongue every 5 (five) minutes as needed for chest pain.    Marland Kitchen OVER THE COUNTER MEDICATION Take 1 tablet by mouth in the morning and at bedtime. [employer] food  blood builder Minis    . OVER THE COUNTER MEDICATION Take 1 tablet by mouth in the morning, at noon, and at bedtime. [place]    . pantoprazole (PROTONIX) 40 MG tablet Take 40 mg by mouth daily.     Marland Kitchen rOPINIRole (REQUIP) 0.5 MG tablet Take 0.5 mg by mouth at bedtime.    Marland Kitchen terazosin (HYTRIN) 1 MG capsule Take 1 mg by mouth at bedtime.    Alveda Reasons 15 MG TABS tablet Take 15 mg by mouth at bedtime.     No current facility-administered medications for this visit.    REVIEW OF SYSTEMS (negative unless checked):   Cardiac:  [] ? Chest pain or chest pressure? [] ? Shortness of breath upon activity? [] ? Shortness of breath when lying flat? [] ? Irregular heart rhythm?  Vascular:  [] ? Pain in calf, thigh, or hip brought on by walking? [] ? Pain in feet at night that wakes you up from your sleep? [] ? Blood clot in your veins? [] ? Leg swelling?  Pulmonary:  [] ? Oxygen at home? [] ? Productive cough? [] ? Wheezing?  Neurologic:  [] ? Sudden weakness in arms or legs? [] ? Sudden numbness in arms or legs? [] ? Sudden onset of difficult speaking or slurred speech? [] ? Temporary loss of vision in one eye? [] ? Problems with dizziness?  Gastrointestinal:  [] ? Blood in stool? [] ? Vomited blood?  Genitourinary:  [] ? Burning when urinating? [] ? Blood in urine?  Psychiatric:  [] ?  Major depression  Hematologic:  [] ? Bleeding problems? [] ? Problems with blood clotting?  Dermatologic:  [] ? Rashes or ulcers?  Constitutional:  [] ? Fever or chills?  Ear/Nose/Throat:  [] ? Change in hearing? [] ? Nose bleeds? [] ? Sore throat?  Musculoskeletal:  [] ? Back pain? [] ? Joint pain? [] ? Muscle pain?   Physical Examination      Vitals:   03/07/20 1500  BP: 130/69  Pulse: 85  Resp: 16  Temp: 97.7 F (36.5 C)  TempSrc: Temporal  SpO2: 98%  Weight: 185 lb (83.9 kg)  Height: 5\' 9"  (1.753 m)   Body mass index is 27.32 kg/m.  General:  WDWN in NAD;  vital signs documented above Gait: Not observed HENT: WNL, normocephalic Pulmonary: normal non-labored breathing , without Rales, rhonchi,  wheezing Cardiac: regular HR Abdomen: soft, NT, no masses Skin: without rashes Vascular Exam/Pulses:  Right Left  Radial 2+ (normal) 1+ (weak)  Ulnar absent absent   Extremities: easily palpable thrill L AC fossa which becomes more pulsatile in mid to upper arm Musculoskeletal: no muscle wasting or atrophy       Neurologic: A&O X 3;  No focal weakness or paresthesias are detected Psychiatric:  The pt has Normal affect.   Non-invasive Vascular Imaging   left Arm Access Duplex:   Diameters:  5 mm  Depth:  <6 mm  Branching in distal arm and mid/proximal arm     Medical Decision Making   Terry Oehlert. is a 85 y.o. male who presents with ESRD requiring hemodialysis.    Patent L brachiocephalic fistula without signs of steal syndrome  Easily palpable thrill near anastomosis however this becomes more pulsatile in the mid to upper arm  I discussed options of allowing dialysis access fistula at the 12-week mark versus proceeding with left arm fistulogram.  In discussing these options with the patient and his daughter they would like to proceed with fistulogram at the next available nondialysis day.  I think this is appropriate given the soft thrill and somewhat pulsatile flow as well as borderline diameter measurement of the fistula  Risks and benefits of fistulogram were discussed with the patient and he agrees to proceed   Terry Ligas PA-C [facility] [place] Office: [facility] MD: Donzetta Matters

## 2020-03-29 ENCOUNTER — Encounter (HOSPITAL_COMMUNITY): Payer: Self-pay | Admitting: Vascular Surgery

## 2020-04-07 ENCOUNTER — Other Ambulatory Visit: Payer: Self-pay

## 2020-04-07 ENCOUNTER — Encounter (HOSPITAL_COMMUNITY): Payer: Self-pay

## 2020-04-07 ENCOUNTER — Inpatient Hospital Stay (HOSPITAL_COMMUNITY)
Admission: EM | Admit: 2020-04-07 | Discharge: 2020-04-09 | DRG: 377 | Disposition: A | Discharge status: Home / Self Care | Payer: Medicare Other | Attending: Family Medicine | Admitting: Family Medicine

## 2020-04-07 DIAGNOSIS — R739 Hyperglycemia, unspecified: Secondary | ICD-10-CM | POA: Diagnosis present

## 2020-04-07 DIAGNOSIS — F17210 Nicotine dependence, cigarettes, uncomplicated: Secondary | ICD-10-CM | POA: Diagnosis present

## 2020-04-07 DIAGNOSIS — D62 Acute posthemorrhagic anemia: Secondary | ICD-10-CM | POA: Diagnosis present

## 2020-04-07 DIAGNOSIS — I482 Chronic atrial fibrillation, unspecified: Secondary | ICD-10-CM | POA: Diagnosis present

## 2020-04-07 DIAGNOSIS — Z801 Family history of malignant neoplasm of trachea, bronchus and lung: Secondary | ICD-10-CM

## 2020-04-07 DIAGNOSIS — Z880 Allergy status to penicillin: Secondary | ICD-10-CM

## 2020-04-07 DIAGNOSIS — K317 Polyp of stomach and duodenum: Secondary | ICD-10-CM | POA: Diagnosis present

## 2020-04-07 DIAGNOSIS — Z20822 Contact with and (suspected) exposure to covid-19: Secondary | ICD-10-CM | POA: Diagnosis present

## 2020-04-07 DIAGNOSIS — E1165 Type 2 diabetes mellitus with hyperglycemia: Secondary | ICD-10-CM | POA: Diagnosis present

## 2020-04-07 DIAGNOSIS — I132 Hypertensive heart and chronic kidney disease with heart failure and with stage 5 chronic kidney disease, or end stage renal disease: Secondary | ICD-10-CM | POA: Diagnosis present

## 2020-04-07 DIAGNOSIS — R195 Other fecal abnormalities: Secondary | ICD-10-CM

## 2020-04-07 DIAGNOSIS — K5730 Diverticulosis of large intestine without perforation or abscess without bleeding: Secondary | ICD-10-CM | POA: Diagnosis present

## 2020-04-07 DIAGNOSIS — E039 Hypothyroidism, unspecified: Secondary | ICD-10-CM | POA: Diagnosis present

## 2020-04-07 DIAGNOSIS — E782 Mixed hyperlipidemia: Secondary | ICD-10-CM

## 2020-04-07 DIAGNOSIS — G4730 Sleep apnea, unspecified: Secondary | ICD-10-CM | POA: Diagnosis not present

## 2020-04-07 DIAGNOSIS — I5032 Chronic diastolic (congestive) heart failure: Secondary | ICD-10-CM | POA: Diagnosis not present

## 2020-04-07 DIAGNOSIS — N185 Chronic kidney disease, stage 5: Secondary | ICD-10-CM | POA: Diagnosis present

## 2020-04-07 DIAGNOSIS — G4733 Obstructive sleep apnea (adult) (pediatric): Secondary | ICD-10-CM | POA: Diagnosis present

## 2020-04-07 DIAGNOSIS — K3189 Other diseases of stomach and duodenum: Secondary | ICD-10-CM | POA: Diagnosis present

## 2020-04-07 DIAGNOSIS — D631 Anemia in chronic kidney disease: Secondary | ICD-10-CM | POA: Diagnosis present

## 2020-04-07 DIAGNOSIS — E1122 Type 2 diabetes mellitus with diabetic chronic kidney disease: Secondary | ICD-10-CM | POA: Diagnosis present

## 2020-04-07 DIAGNOSIS — K766 Portal hypertension: Secondary | ICD-10-CM | POA: Diagnosis present

## 2020-04-07 DIAGNOSIS — I10 Essential (primary) hypertension: Secondary | ICD-10-CM | POA: Diagnosis present

## 2020-04-07 DIAGNOSIS — K228 Other specified diseases of esophagus: Secondary | ICD-10-CM | POA: Diagnosis not present

## 2020-04-07 DIAGNOSIS — K259 Gastric ulcer, unspecified as acute or chronic, without hemorrhage or perforation: Secondary | ICD-10-CM | POA: Diagnosis present

## 2020-04-07 DIAGNOSIS — I509 Heart failure, unspecified: Secondary | ICD-10-CM

## 2020-04-07 DIAGNOSIS — Z79899 Other long term (current) drug therapy: Secondary | ICD-10-CM

## 2020-04-07 DIAGNOSIS — M170 Bilateral primary osteoarthritis of knee: Secondary | ICD-10-CM | POA: Diagnosis present

## 2020-04-07 DIAGNOSIS — I5022 Chronic systolic (congestive) heart failure: Secondary | ICD-10-CM | POA: Diagnosis present

## 2020-04-07 DIAGNOSIS — K5731 Diverticulosis of large intestine without perforation or abscess with bleeding: Secondary | ICD-10-CM | POA: Diagnosis present

## 2020-04-07 DIAGNOSIS — K922 Gastrointestinal hemorrhage, unspecified: Secondary | ICD-10-CM | POA: Diagnosis present

## 2020-04-07 DIAGNOSIS — N400 Enlarged prostate without lower urinary tract symptoms: Secondary | ICD-10-CM | POA: Diagnosis present

## 2020-04-07 DIAGNOSIS — Z7901 Long term (current) use of anticoagulants: Secondary | ICD-10-CM

## 2020-04-07 DIAGNOSIS — Z8711 Personal history of peptic ulcer disease: Secondary | ICD-10-CM | POA: Diagnosis not present

## 2020-04-07 DIAGNOSIS — Z992 Dependence on renal dialysis: Secondary | ICD-10-CM

## 2020-04-07 DIAGNOSIS — K921 Melena: Secondary | ICD-10-CM | POA: Diagnosis present

## 2020-04-07 DIAGNOSIS — H9190 Unspecified hearing loss, unspecified ear: Secondary | ICD-10-CM

## 2020-04-07 DIAGNOSIS — E1151 Type 2 diabetes mellitus with diabetic peripheral angiopathy without gangrene: Secondary | ICD-10-CM | POA: Diagnosis present

## 2020-04-07 DIAGNOSIS — E785 Hyperlipidemia, unspecified: Secondary | ICD-10-CM | POA: Diagnosis present

## 2020-04-07 DIAGNOSIS — K644 Residual hemorrhoidal skin tags: Secondary | ICD-10-CM | POA: Diagnosis present

## 2020-04-07 DIAGNOSIS — N186 End stage renal disease: Secondary | ICD-10-CM

## 2020-04-07 DIAGNOSIS — Z79890 Hormone replacement therapy (postmenopausal): Secondary | ICD-10-CM

## 2020-04-07 DIAGNOSIS — Z8719 Personal history of other diseases of the digestive system: Secondary | ICD-10-CM

## 2020-04-07 LAB — PROTIME-INR
INR: 1.2 (ref 0.8–1.2)
Prothrombin Time: 15.2 seconds (ref 11.4–15.2)

## 2020-04-07 LAB — TYPE AND SCREEN
ABO/RH(D): A POS
Antibody Screen: NEGATIVE

## 2020-04-07 LAB — CBC
HCT: 35 % — ABNORMAL LOW (ref 39.0–52.0)
Hemoglobin: 11.4 g/dL — ABNORMAL LOW (ref 13.0–17.0)
MCH: 30.2 pg (ref 26.0–34.0)
MCHC: 32.6 g/dL (ref 30.0–36.0)
MCV: 92.8 fL (ref 80.0–100.0)
Platelets: 155 10*3/uL (ref 150–400)
RBC: 3.77 MIL/uL — ABNORMAL LOW (ref 4.22–5.81)
RDW: 13.9 % (ref 11.5–15.5)
WBC: 8.4 10*3/uL (ref 4.0–10.5)
nRBC: 0 % (ref 0.0–0.2)

## 2020-04-07 LAB — COMPREHENSIVE METABOLIC PANEL
ALT: 21 U/L (ref 0–44)
AST: 24 U/L (ref 15–41)
Albumin: 3.1 g/dL — ABNORMAL LOW (ref 3.5–5.0)
Alkaline Phosphatase: 90 U/L (ref 38–126)
Anion gap: 14 (ref 5–15)
BUN: 73 mg/dL — ABNORMAL HIGH (ref 8–23)
CO2: 28 mmol/L (ref 22–32)
Calcium: 8.6 mg/dL — ABNORMAL LOW (ref 8.9–10.3)
Chloride: 92 mmol/L — ABNORMAL LOW (ref 98–111)
Creatinine, Ser: 5.52 mg/dL — ABNORMAL HIGH (ref 0.61–1.24)
GFR calc Af Amer: 10 mL/min — ABNORMAL LOW (ref >60)
GFR calc non Af Amer: 9 mL/min — ABNORMAL LOW (ref >60)
Glucose, Bld: 161 mg/dL — ABNORMAL HIGH (ref 70–99)
Potassium: 4.2 mmol/L (ref 3.5–5.1)
Sodium: 134 mmol/L — ABNORMAL LOW (ref 135–145)
Total Bilirubin: 0.7 mg/dL (ref 0.3–1.2)
Total Protein: 6.9 g/dL (ref 6.5–8.1)

## 2020-04-07 LAB — SARS CORONAVIRUS 2 BY RT PCR (HOSPITAL ORDER, PERFORMED IN ~~LOC~~ HOSPITAL LAB): SARS Coronavirus 2: NEGATIVE

## 2020-04-07 LAB — GLUCOSE, CAPILLARY
Glucose-Capillary: 95 mg/dL (ref 70–99)
Glucose-Capillary: 237 mg/dL — ABNORMAL HIGH (ref 70–99)

## 2020-04-07 LAB — HEMOGLOBIN A1C
Hgb A1c MFr Bld: 7.3 % — ABNORMAL HIGH (ref 4.8–5.6)
Mean Plasma Glucose: 162.81 mg/dL

## 2020-04-07 LAB — POC OCCULT BLOOD, ED: Fecal Occult Bld: POSITIVE — AB

## 2020-04-07 MED ORDER — ONDANSETRON HCL 4 MG/2ML IJ SOLN: 4.0000 mg | Freq: Four times a day (QID) | INTRAMUSCULAR | Status: DC | PRN

## 2020-04-07 MED ORDER — TERAZOSIN HCL 1 MG PO CAPS
1.0000 mg | ORAL_CAPSULE | Freq: Every day | ORAL | Status: DC
  Administered 2020-04-07 – 2020-04-08 (×2): 1 mg via ORAL
  Filled 2020-04-07 (×2): qty 1

## 2020-04-07 MED ORDER — ONDANSETRON HCL 4 MG PO TABS: 4.0000 mg | ORAL_TABLET | Freq: Four times a day (QID) | ORAL | Status: DC | PRN

## 2020-04-07 MED ORDER — ACETAMINOPHEN 325 MG PO TABS
650.0000 mg | ORAL_TABLET | Freq: Four times a day (QID) | ORAL | Status: DC | PRN
  Administered 2020-04-07 – 2020-04-08 (×2): 650 mg via ORAL
  Filled 2020-04-07 (×2): qty 2

## 2020-04-07 MED ORDER — ATORVASTATIN CALCIUM 40 MG PO TABS
40.0000 mg | ORAL_TABLET | Freq: Every day | ORAL | Status: DC
  Administered 2020-04-07 – 2020-04-08 (×2): 40 mg via ORAL
  Filled 2020-04-07 (×2): qty 1

## 2020-04-07 MED ORDER — NICOTINE 21 MG/24HR TD PT24
21.0000 mg | MEDICATED_PATCH | Freq: Every day | TRANSDERMAL | Status: DC
  Administered 2020-04-07 – 2020-04-09 (×3): 21 mg via TRANSDERMAL
  Filled 2020-04-07 (×3): qty 1

## 2020-04-07 MED ORDER — ACETAMINOPHEN 650 MG RE SUPP: 650.0000 mg | Freq: Four times a day (QID) | RECTAL | Status: DC | PRN

## 2020-04-07 MED ORDER — CARVEDILOL 12.5 MG PO TABS
12.5000 mg | ORAL_TABLET | Freq: Two times a day (BID) | ORAL | Status: DC
  Administered 2020-04-07 – 2020-04-09 (×2): 12.5 mg via ORAL
  Filled 2020-04-07 (×5): qty 1

## 2020-04-07 MED ORDER — INSULIN ASPART 100 UNIT/ML ~~LOC~~ SOLN
0.0000 [IU] | Freq: Three times a day (TID) | SUBCUTANEOUS | Status: DC
  Administered 2020-04-09: 1 [IU] via SUBCUTANEOUS

## 2020-04-07 MED ORDER — SODIUM CHLORIDE 0.9% FLUSH
3.0000 mL | Freq: Two times a day (BID) | INTRAVENOUS | Status: DC
  Administered 2020-04-07 – 2020-04-09 (×4): 3 mL via INTRAVENOUS

## 2020-04-07 MED ORDER — LEVOTHYROXINE SODIUM 25 MCG PO TABS
25.0000 ug | ORAL_TABLET | Freq: Every day | ORAL | Status: DC
  Administered 2020-04-09: 25 ug via ORAL
  Filled 2020-04-07: qty 1

## 2020-04-07 MED ORDER — HYDROCODONE-ACETAMINOPHEN 5-325 MG PO TABS: 1.0000 | ORAL_TABLET | ORAL | Status: DC | PRN

## 2020-04-07 MED ORDER — PANTOPRAZOLE SODIUM 40 MG IV SOLR
40.0000 mg | INTRAVENOUS | Status: DC
  Administered 2020-04-07: 40 mg via INTRAVENOUS
  Filled 2020-04-07: qty 40

## 2020-04-07 MED ORDER — NITROGLYCERIN 0.4 MG SL SUBL: 0.4000 mg | SUBLINGUAL_TABLET | SUBLINGUAL | Status: DC | PRN

## 2020-04-07 MED ORDER — SODIUM CHLORIDE 0.9 % IV SOLN: 250.0000 mL | INTRAVENOUS | Status: DC | PRN

## 2020-04-07 MED ORDER — PANTOPRAZOLE SODIUM 40 MG IV SOLR
40.0000 mg | Freq: Two times a day (BID) | INTRAVENOUS | Status: DC
  Administered 2020-04-08 – 2020-04-09 (×3): 40 mg via INTRAVENOUS
  Filled 2020-04-07 (×3): qty 40

## 2020-04-07 MED ORDER — ROPINIROLE HCL 0.25 MG PO TABS
0.5000 mg | ORAL_TABLET | Freq: Every day | ORAL | Status: DC
  Administered 2020-04-07 – 2020-04-08 (×2): 0.5 mg via ORAL
  Filled 2020-04-07 (×2): qty 2

## 2020-04-07 MED ORDER — SODIUM CHLORIDE 0.9% FLUSH: 3.0000 mL | INTRAVENOUS | Status: DC | PRN

## 2020-04-07 NOTE — Consult Note (Signed)
Referring Provider: Irwin Brakeman, MD Primary Care Physician:  [name], MD Primary Gastroenterologist:  Dr. Laural Golden  Reason for Consultation:   GI bleed  HPI:   Patient is 85 year old Caucasian male who has history of peptic ulcer disease which is summarized below who was in usual state of health until 4 days ago when he noted his stools to be black.  Over the next 2 or 3 days his stool color changed from black to burgundy and then red blood last night.  Patient states he was begun on Xarelto about 4 to 5 months ago for atrial fibrillation.  He took a dose night before last but did not take it yesterday or this morning before he came to emergency room.  He has not experienced abdominal pain nausea vomiting or postural symptoms.  He states when he was begun on Xarelto aspirin was discontinued.  He does not take OTC NSAIDs. He has good appetite and he has not lost any weight. He is on hemodialysis.  Last dialysis was on Saturday and he is due next dialysis on 04/08/2020. Patient's H&H on admission was 11.4 and 35.0.  His hemoglobin 10 days ago was 13.9 and hematocrit was 41.0.  His INR was 1.2. Patient states he does not drive anymore.  He has severe bilateral knee arthritis and he uses walker to ambulate.  Patient has history of peptic ulcer disease.  He was diagnosed with gastric ulcer in 2015 by Dr. Earley Brooke of [facility].  Patient had been taking Aleve at that time.  He was admitted to [facility] in July 2020 via renal clinic where he was complaining of progressive weakness and exertional dyspnea and noted to have hemoglobin of 4.8 g.  He received 3 units of PRBCs.  He has been on full dose aspirin.  He underwent esophagogastroduodenoscopy which revealed a prepyloric gastric ulcer without stigmata of bleed.  This nevertheless was felt to be source of chronic GI bleed.  H. pylori serology was negative.  Patient was advised to resume aspirin at a low dose 2 weeks later.  He  has not had any problems until now.  Patient is widowed.  He lost his wife 11 years ago.  He has 2 daughters and 1 son.  1 daughter lives with him.  Another daughter lives in [place] and helps with this doctor visits and trips to hemodialysis center.  He does not drink alcohol.  He smokes 3 to 4 cigars/day.  He works with a company working in [employer] for 50 years.    Past Medical History:  Diagnosis Date  . Anemia   . Arthritis   . Atrial fibrillation ([place])   . CHF (congestive heart failure) ([place])   . Chronic kidney disease    Stage 5  . Chronic kidney disease-mineral and bone disorder   . Headache   . HOH (hard of hearing)   . Hyperlipidemia   . Hypertension   . Hypothyroidism   . Peripheral vascular disease ([place])   . Proteinuria   . Sleep apnea    wears CPAP 12  . Type 2 diabetes mellitus with stage 5 chronic kidney disease ([place])   . Wears glasses     Past Surgical History:  Procedure Laterality Date  . A/V FISTULAGRAM N/A 03/14/2020   Procedure: A/V FISTULAGRAM - left arm;  Surgeon: Elam Dutch, MD;  Location: [place] [facility];  Service: Cardiovascular;  Laterality: N/A;  . AV FISTULA PLACEMENT Left 01/01/2020  Procedure: LEFT ARM ARTERIOVENOUS (AV) FISTULA CREATION;  Surgeon: Angelia Mould, MD;  Location: [place];  Service: Vascular;  Laterality: Left;  . CATARACT EXTRACTION W/ INTRAOCULAR LENS  IMPLANT, BILATERAL    . COLONOSCOPY    . ESOPHAGOGASTRODUODENOSCOPY N/A 04/25/2019   Procedure: ESOPHAGOGASTRODUODENOSCOPY (EGD);  Surgeon: [name], MD;  [facility];  Service: Endoscopy;  Laterality: N/A;  . HIP ARTHROSCOPY Left   . INSERTION OF DIALYSIS CATHETER Right 01/01/2020   Procedure: INSERTION OF RIGHT INTERNAL JUGULAR DIALYSIS CATHETER;  Surgeon: Angelia Mould, MD;  Location: [place];  Service: Vascular;  Laterality: Right;  . LIGATION OF COMPETING BRANCHES OF ARTERIOVENOUS FISTULA Left 03/28/2020   Procedure:  SIDE BRANCH LIGATION TIMES THREE OF LEFT ARM  ARTERIOVENOUS FISTULA;  Surgeon: Marty Heck, MD;  Location: [place];  Service: Vascular;  Laterality: Left;    Prior to Admission medications   Medication Sig Start Date End Date Taking? Authorizing Provider  acetaminophen (TYLENOL) 650 MG CR tablet Take 1,300 mg by mouth in the morning and at bedtime.   Yes [provider]  atorvastatin (LIPITOR) 40 MG tablet Take 40 mg by mouth at bedtime.    Yes Fran Lowes, MD  carvedilol (COREG) 12.5 MG tablet Take 12.5 mg by mouth 2 (two) times daily. 11/07/19  Yes [provider]  docusate sodium (COLACE) 100 MG capsule Take 100 mg by mouth daily as needed for mild constipation.   Yes [provider]  HYDROcodone-acetaminophen (NORCO/VICODIN) 5-325 MG tablet Take 1 tablet by mouth every 4 (four) hours as needed for moderate pain. 03/28/20 03/28/21 Yes Baglia, Corrina, PA-C  levothyroxine (SYNTHROID) 25 MCG tablet Take 25 mcg by mouth daily before breakfast.   Yes [provider]  Methoxy PEG-Epoetin Beta (MIRCERA IJ) Inject as directed as needed (Low hemoglobin).   Yes [provider]  Multiple Vitamin (DAILY VITE PO) Take 800 mg by mouth daily. With Zinc   Yes [provider]  nitroGLYCERIN (NITROSTAT) 0.4 MG SL tablet Place 0.4 mg under the tongue every 5 (five) minutes as needed for chest pain.   Yes Fran Lowes, MD  pantoprazole (PROTONIX) 40 MG tablet Take 40 mg by mouth at bedtime.    Yes Fran Lowes, MD  rOPINIRole (REQUIP) 0.5 MG tablet Take 0.5 mg by mouth at bedtime.   Yes [provider]  terazosin (HYTRIN) 1 MG capsule Take 1 mg by mouth at bedtime.   Yes Fran Lowes, MD  torsemide (DEMADEX) 20 MG tablet Take 40 mg by mouth 2 (two) times daily. 03/22/20  Yes [provider]  XARELTO 15 MG TABS tablet Take 15 mg by mouth daily with supper.  12/06/19   [provider]    Current  Facility-Administered Medications  Medication Dose Route Frequency Provider Last Rate Last Admin  . 0.9 %  sodium chloride infusion  250 mL Intravenous PRN Johnson, Clanford L, MD      . acetaminophen (TYLENOL) tablet 650 mg  650 mg Oral Q6H PRN Johnson, Clanford L, MD       Or  . acetaminophen (TYLENOL) suppository 650 mg  650 mg Rectal Q6H PRN Johnson, Clanford L, MD      . atorvastatin (LIPITOR) tablet 40 mg  40 mg Oral QHS Johnson, Clanford L, MD      . carvedilol (COREG) tablet 12.5 mg  12.5 mg Oral BID WC Johnson, Clanford L, MD   12.5 mg at 04/07/20 1717  . HYDROcodone-acetaminophen (NORCO/VICODIN)  5-325 MG per tablet 1 tablet  1 tablet Oral Q4H PRN Johnson, Clanford L, MD      . insulin aspart (novoLOG) injection 0-6 Units  0-6 Units Subcutaneous TID WC Johnson, Clanford L, MD      . Derrill Memo ON 04/08/2020] levothyroxine (SYNTHROID) tablet 25 mcg  25 mcg Oral QAC breakfast Johnson, Clanford L, MD      . nicotine (NICODERM CQ - dosed in mg/24 hours) patch 21 mg  21 mg Transdermal Daily Johnson, Clanford L, MD   21 mg at 04/07/20 1716  . nitroGLYCERIN (NITROSTAT) SL tablet 0.4 mg  0.4 mg Sublingual Q5 min PRN Johnson, Clanford L, MD      . ondansetron (ZOFRAN) tablet 4 mg  4 mg Oral Q6H PRN Johnson, Clanford L, MD       Or  . ondansetron (ZOFRAN) injection 4 mg  4 mg Intravenous Q6H PRN Johnson, Clanford L, MD      . pantoprazole (PROTONIX) injection 40 mg  40 mg Intravenous Q24H Johnson, Clanford L, MD   40 mg at 04/07/20 1718  . rOPINIRole (REQUIP) tablet 0.5 mg  0.5 mg Oral QHS Johnson, Clanford L, MD      . sodium chloride flush (NS) 0.9 % injection 3 mL  3 mL Intravenous Q12H Johnson, Clanford L, MD      . sodium chloride flush (NS) 0.9 % injection 3 mL  3 mL Intravenous PRN Johnson, Clanford L, MD      . terazosin (HYTRIN) capsule 1 mg  1 mg Oral QHS Johnson, Clanford L, MD        Allergies as of 04/07/2020 - Review Complete 04/07/2020  Allergen Reaction Noted  . Penicillins Rash  and Other (See Comments) 11/08/2018  . Quinine  07/26/2019    Family History  Problem Relation Age of Onset  . Lung cancer Father     Social History   Socioeconomic History  . Marital status: Widowed    Spouse name: Not on file  . Number of children: Not on file  . Years of education: Not on file  . Highest education level: Not on file  Occupational History  . Not on file  Tobacco Use  . Smoking status: Current Every Day Smoker    Packs/day: 0.25    Types: Cigars  . Smokeless tobacco: Never Used  Vaping Use  . Vaping Use: Never used  Substance and Sexual Activity  . Alcohol use: Yes    Comment: rarely  . Drug use: Never  . Sexual activity: Not on file  Other Topics Concern  . Not on file  Social History Narrative  . Not on file   Social Determinants of Health   Financial Resource Strain:   . Difficulty of Paying Living Expenses:   Food Insecurity:   . Worried About Charity fundraiser in the Last Year:   . Arboriculturist in the Last Year:   Transportation Needs:   . Film/video editor (Medical):   Marland Kitchen Lack of Transportation (Non-Medical):   Physical Activity:   . Days of Exercise per Week:   . Minutes of Exercise per Session:   Stress:   . Feeling of Stress :   Social Connections:   . Frequency of Communication with Friends and Family:   . Frequency of Social Gatherings with Friends and Family:   . Attends Religious Services:   . Active Member of Clubs or Organizations:   . Attends Archivist Meetings:   .  Marital Status:   Intimate Partner Violence:   . Fear of Current or Ex-Partner:   . Emotionally Abused:   Marland Kitchen Physically Abused:   . Sexually Abused:     Review of Systems: See HPI, otherwise normal ROS  Physical Exam: Temp:  [98.2 F (36.8 C)] 98.2 F (36.8 C) (06/28 0852) Pulse Rate:  [31-96] 96 (06/28 1402) Resp:  [10-28] 18 (06/28 1402) BP: (106-122)/(45-62) 122/45 (06/28 1402) SpO2:  [90 %-100 %] 98 % (06/28 1402) Weight:   [83 kg-83.2 kg] 83.2 kg (06/28 1402) Last BM Date: 04/07/20  Patient is alert and in no acute distress. Conjunctiva is pink.  Sclerae nonicteric. Oropharyngeal mucosa is normal.   No neck masses or thyromegaly noted. Exam with regular rhythm normal S1 and S2.  Faint systolic murmur noted at aortic area. Auscultation lungs reveal vesicular breath sounds bilaterally. Abdomen is full.  Bowel sounds are normal.  On palpation is soft with mild tenderness in midepigastric region.  No organomegaly or masses. No peripheral edema clubbing or koilonychia noted.   Lab Results: Recent Labs    04/07/20 0900  WBC 8.4  HGB 11.4*  HCT 35.0*  PLT 155   BMET Recent Labs    04/07/20 0900  NA 134*  K 4.2  CL 92*  CO2 28  GLUCOSE 161*  BUN 73*  CREATININE 5.52*  CALCIUM 8.6*   LFT Recent Labs    04/07/20 0900  PROT 6.9  ALBUMIN 3.1*  AST 24  ALT 21  ALKPHOS 90  BILITOT 0.7   PT/INR Recent Labs    04/07/20 0900  LABPROT 15.2  INR 1.2   SARS coronavirus 2 negative.  Assessment;  Patient is 85 year old Caucasian male who has history of peptic ulcer disease(2015 and 2020) secondary to NSAID use who presents with 4-day history of GI bleed starting with melena and then turning into dark blood.  Patient is on Xarelto/rivaroxaban which he stopped 2 days ago.  Last colonoscopy was 2 to 3 years ago. Suspect GI bleed secondary to recurrent peptic ulcer disease.  However if he does not have peptic ulcer disease would consider colonoscopy and small bowel given capsule study.  End-stage renal disease.  Patient will be due for hemodialysis on 04/08/2020.  Type 2 diabetes mellitus.  Recommendations;  Clear liquids today. Pantoprazole dose changed to 40 mg IV every 12 hours. Will monitor H&H closely over the next 24 hours. Esophagogastroduodenoscopy on 04/08/2020 with conscious sedation.   LOS: 0 days   Samaj Wessells  04/07/2020, 5:47 PM

## 2020-04-07 NOTE — ED Provider Notes (Signed)
[facility] EMERGENCY DEPARTMENT Provider Note   CSN: [id number] Arrival date & time: 04/07/20  9323     History Chief Complaint  Patient presents with  . GI Bleeding    Terry Graves. is a 85 y.o. male w/ ESRD on Tues, Thurs, Sat dialysis, A Fib on xarelto, presenting to ED with blood in stool.  Patient reporting past 3 days noticing bright and dark blood in his stool on every BM.  This morning he "filled the toilet bowl."  He reports he has had GI bleeding in the past and was treated "years ago" for a stomach ulcer.  He is not having epigastric pain.  He does feel mildly lightheaded.  Ct performed on 04/24/2019 showed descending colonic and sigmoid diverticulosis  Per our records his last endoscopy was 04/25/2019 which showed, impression:  duodenal bulb and second portion of the duodenum were normal. - Normal esophagus. - Z-line irregular, 40 cm from the incisors. - Gastritis. - Non-bleeding gastric ulcer with no stigmata of bleeding. - Normal duodenal bulb and second portion of the duodenum. - No specimens collected.  HPI     Past Medical History:  Diagnosis Date  . Anemia   . Arthritis   . Atrial fibrillation ([place])   . CHF (congestive heart failure) ([place])   . Chronic kidney disease    Stage 5  . Chronic kidney disease-mineral and bone disorder   . Headache   . HOH (hard of hearing)   . Hyperlipidemia   . Hypertension   . Hypothyroidism   . Peripheral vascular disease ([place])   . Proteinuria   . Sleep apnea    wears CPAP 12  . Type 2 diabetes mellitus with stage 5 chronic kidney disease ([place])   . Wears glasses     Patient Active Problem List   Diagnosis Date Noted  . GI bleeding 04/07/2020  . CHF (congestive heart failure) ([place])   . HOH (hard of hearing)   . Hypothyroidism   . Sleep apnea   . ESRD on hemodialysis ([place])   . Hyperglycemia   . History of Prepyloric ulcer   . Melena 04/25/2019  . Acute blood loss anemia 04/25/2019  . Heme positive  stool   . Symptomatic anemia   . Gastrointestinal hemorrhage with melena 04/24/2019  . Hypertension   . Anemia due to stage 5 chronic kidney disease ([place])   . Hyperlipidemia   . Type 2 diabetes mellitus with stage 5 chronic kidney disease ([place])     Past Surgical History:  Procedure Laterality Date  . A/V FISTULAGRAM N/A 03/14/2020   Procedure: A/V FISTULAGRAM - left arm;  Surgeon: Elam Dutch, MD;  Location: [place] [facility];  Service: Cardiovascular;  Laterality: N/A;  . AV FISTULA PLACEMENT Left 01/01/2020   Procedure: LEFT ARM ARTERIOVENOUS (AV) FISTULA CREATION;  Surgeon: Angelia Mould, MD;  Location: [place];  Service: Vascular;  Laterality: Left;  . CATARACT EXTRACTION W/ INTRAOCULAR LENS  IMPLANT, BILATERAL    . COLONOSCOPY    . ESOPHAGOGASTRODUODENOSCOPY N/A 04/25/2019   Procedure: ESOPHAGOGASTRODUODENOSCOPY (EGD);  Surgeon: [name], MD;  [facility];  Service: Endoscopy;  Laterality: N/A;  . HIP ARTHROSCOPY Left   . INSERTION OF DIALYSIS CATHETER Right 01/01/2020   Procedure: INSERTION OF RIGHT INTERNAL JUGULAR DIALYSIS CATHETER;  Surgeon: Angelia Mould, MD;  Location: [place];  Service: Vascular;  Laterality: Right;  . LIGATION OF COMPETING BRANCHES OF ARTERIOVENOUS FISTULA Left 03/28/2020  Procedure: SIDE BRANCH LIGATION TIMES THREE OF LEFT ARM  ARTERIOVENOUS FISTULA;  Surgeon: Marty Heck, MD;  [facility];  Service: Vascular;  Laterality: Left;       Family History  Problem Relation Age of Onset  . Lung cancer Father     Social History   Tobacco Use  . Smoking status: Current Every Day Smoker    Packs/day: 0.25    Types: Cigars  . Smokeless tobacco: Never Used  Vaping Use  . Vaping Use: Never used  Substance Use Topics  . Alcohol use: Yes    Comment: rarely  . Drug use: Never    Home Medications Prior to Admission medications   Medication Sig Start Date End Date Taking? Authorizing Provider   acetaminophen (TYLENOL) 650 MG CR tablet Take 1,300 mg by mouth in the morning and at bedtime.   Yes [provider]  atorvastatin (LIPITOR) 40 MG tablet Take 40 mg by mouth at bedtime.    Yes Fran Lowes, MD  carvedilol (COREG) 12.5 MG tablet Take 12.5 mg by mouth 2 (two) times daily. 11/07/19  Yes [provider]  docusate sodium (COLACE) 100 MG capsule Take 100 mg by mouth daily as needed for mild constipation.   Yes [provider]  HYDROcodone-acetaminophen (NORCO/VICODIN) 5-325 MG tablet Take 1 tablet by mouth every 4 (four) hours as needed for moderate pain. 03/28/20 03/28/21 Yes Baglia, Corrina, PA-C  levothyroxine (SYNTHROID) 25 MCG tablet Take 25 mcg by mouth daily before breakfast.   Yes [provider]  Methoxy PEG-Epoetin Beta (MIRCERA IJ) Inject as directed as needed (Low hemoglobin).   Yes [provider]  Multiple Vitamin (DAILY VITE PO) Take 800 mg by mouth daily. With Zinc   Yes [provider]  nitroGLYCERIN (NITROSTAT) 0.4 MG SL tablet Place 0.4 mg under the tongue every 5 (five) minutes as needed for chest pain.   Yes Fran Lowes, MD  pantoprazole (PROTONIX) 40 MG tablet Take 40 mg by mouth at bedtime.    Yes Fran Lowes, MD  rOPINIRole (REQUIP) 0.5 MG tablet Take 0.5 mg by mouth at bedtime.   Yes [provider]  terazosin (HYTRIN) 1 MG capsule Take 1 mg by mouth at bedtime.   Yes Fran Lowes, MD  torsemide (DEMADEX) 20 MG tablet Take 40 mg by mouth 2 (two) times daily. 03/22/20  Yes [provider]  XARELTO 15 MG TABS tablet Take 15 mg by mouth daily with supper.  12/06/19   [provider]    Allergies    Penicillins and Quinine  Review of Systems   Review of Systems  Constitutional: Negative for chills and fever.  HENT: Negative for ear pain and sore throat.   Eyes: Negative for photophobia and visual disturbance.  Respiratory: Positive for shortness of breath.  Negative for cough.   Cardiovascular: Negative for chest pain and palpitations.  Gastrointestinal: Positive for blood in stool. Negative for abdominal pain, constipation, diarrhea and vomiting.  Genitourinary: Negative for dysuria and hematuria.  Musculoskeletal: Negative for arthralgias and back pain.  Skin: Negative for color change and rash.  Neurological: Positive for light-headedness. Negative for syncope.  Psychiatric/Behavioral: Negative for agitation and confusion.  All other systems reviewed and are negative.   Physical Exam Updated Vital Signs BP (!) 122/45 (BP Location: Right Arm)   Pulse 96   Temp 98.2 F (36.8 C) (Oral)   Resp 18   Ht 5\' 9"  (1.753 m)   Wt 83.2 kg  SpO2 98%   BMI 27.09 kg/m   Physical Exam Vitals and nursing note reviewed.  Constitutional:      Appearance: He is well-developed.  HENT:     Head: Normocephalic and atraumatic.  Eyes:     Conjunctiva/sclera: Conjunctivae normal.  Cardiovascular:     Rate and Rhythm: Normal rate and regular rhythm.     Pulses: Normal pulses.  Pulmonary:     Effort: Pulmonary effort is normal. No respiratory distress.     Breath sounds: Normal breath sounds.  Abdominal:     General: There is no distension.     Palpations: Abdomen is soft.     Tenderness: There is no abdominal tenderness. There is no guarding.  Genitourinary:    Comments: Rectal exam performed with nurse chaperone in room Dark stool, scant on exam Hemoccult positive Musculoskeletal:     Cervical back: Neck supple.  Skin:    General: Skin is warm and dry.  Neurological:     General: No focal deficit present.     Mental Status: He is alert.  Psychiatric:        Mood and Affect: Mood normal.        Behavior: Behavior normal.     ED Results / Procedures / Treatments   Labs (all labs ordered are listed, but only abnormal results are displayed) Labs Reviewed  COMPREHENSIVE METABOLIC PANEL - Abnormal; Notable for the following components:       Result Value   Sodium 134 (*)    Chloride 92 (*)    Glucose, Bld 161 (*)    BUN 73 (*)    Creatinine, Ser 5.52 (*)    Calcium 8.6 (*)    Albumin 3.1 (*)    GFR calc non Af Amer 9 (*)    GFR calc Af Amer 10 (*)    All other components within normal limits  CBC - Abnormal; Notable for the following components:   RBC 3.77 (*)    Hemoglobin 11.4 (*)    HCT 35.0 (*)    All other components within normal limits  POC OCCULT BLOOD, ED - Abnormal; Notable for the following components:   Fecal Occult Bld POSITIVE (*)    All other components within normal limits  SARS CORONAVIRUS 2 BY RT PCR (HOSPITAL ORDER, [facility])  PROTIME-INR  GLUCOSE, CAPILLARY  HEMOGLOBIN A1C  MAGNESIUM  CBC  PROTIME-INR  TYPE AND SCREEN    EKG EKG Interpretation  Date/Time:  Monday April 07 2020 08:52:17 EDT Ventricular Rate:  76 PR Interval:    QRS Duration: 127 QT Interval:  421 QTC Calculation: 474 R Axis:   37 Text Interpretation: Atrial fibrillation Right bundle branch block Anteroseptal infarct, old No STEMI Confirmed by Octaviano Glow [phone]) on 04/07/2020 9:01:01 AM   Radiology No results found.  Procedures Procedures (including critical care time)  Medications Ordered in ED Medications  HYDROcodone-acetaminophen (NORCO/VICODIN) 5-325 MG per tablet 1 tablet (has no administration in time range)  atorvastatin (LIPITOR) tablet 40 mg (has no administration in time range)  carvedilol (COREG) tablet 12.5 mg (12.5 mg Oral Given 04/07/20 1717)  nitroGLYCERIN (NITROSTAT) SL tablet 0.4 mg (has no administration in time range)  terazosin (HYTRIN) capsule 1 mg (has no administration in time range)  levothyroxine (SYNTHROID) tablet 25 mcg (has no administration in time range)  rOPINIRole (REQUIP) tablet 0.5 mg (has no administration in time range)  sodium chloride flush (NS) 0.9 % injection 3 mL (has no  administration in time range)  sodium chloride flush (NS) 0.9 %  injection 3 mL (has no administration in time range)  0.9 %  sodium chloride infusion (has no administration in time range)  pantoprazole (PROTONIX) injection 40 mg (40 mg Intravenous Given 04/07/20 1718)  acetaminophen (TYLENOL) tablet 650 mg (has no administration in time range)    Or  acetaminophen (TYLENOL) suppository 650 mg (has no administration in time range)  ondansetron (ZOFRAN) tablet 4 mg (has no administration in time range)    Or  ondansetron (ZOFRAN) injection 4 mg (has no administration in time range)  insulin aspart (novoLOG) injection 0-6 Units (0 Units Subcutaneous Not Given 04/07/20 1643)  nicotine (NICODERM CQ - dosed in mg/24 hours) patch 21 mg (21 mg Transdermal Patch Applied 04/07/20 1716)    ED Course  I have reviewed the triage vital signs and the nursing notes.  Pertinent labs & imaging results that were available during my care of the patient were reviewed by me and considered in my medical decision making (see chart for details).  85 yo male presenting with painless blood in stool for 4 days.  On anticoagulation.  Reports some mild lightheadedness at home, limited mobility at baseline due to chronic comorbidities.  Patient is compliant with dialysis, no missed sessions.  Melena on rectal exam Vitals stable.  No evidence of significant GI hemorrhage at this time.  Labs personally reviewed, notable for hgb11.4, CMP relatively unremarkable, Cr at baseline, no leukocytosis.    Abdominal exam is benign.  No focal epigastric tenderness or pain to suggest perforated ulcer.  He did have a shallow ulcer seen on EGD last year which may be a cause of his symptoms. He also has significant sigmoid and descending colonic diverticulosis seen on CT last year, which may also be a source of his bleeding.  Plan to admit, monitor repeat hgb, consult with GI as inpatient.  Stable for med tele bed.  Clinical Course as of Apr 07 1721  Mon Apr 07, 2020  1106 Daughter now at bedside.   Informed them my recomemndation for hospitalization and GI consultation, possible need for endoscopy given that he has a known stomach ulcer, he may also be having diverticular bleed.  Needs repeat hgb level check, consideration of changes to anticoagulation med if persistent bleeding.  Pt and daughter agree with plan    [MT]  1107 HR 31 entered in error, rate remains 70's   [MT]  1107 Admitted to hospitalist   [MT]    Clinical Course User Index [MT] Obryan Radu, Carola Rhine, MD    Final Clinical Impression(s) / ED Diagnoses Final diagnoses:  Gastrointestinal hemorrhage, unspecified gastrointestinal hemorrhage type    Rx / DC Orders ED Discharge Orders    None       Wyvonnia Dusky, MD 04/07/20 1722

## 2020-04-07 NOTE — ED Triage Notes (Signed)
Pt reports rectal bleeding for the past week.  Reports is on a blood thinner and stopped taking it yesterday.  Denies any n/v/d.  Denies any weakness, dizziness, or sob.  Pt recently had dialysis graft placed in left arm at [facility].

## 2020-04-07 NOTE — H&P (Signed)
History and Physical  Terry Graves. WLS:[id number] DOB: 1935-05-23 DOA: 04/07/2020  PCP: [name], MD  Patient coming from: Home   I have personally briefly reviewed patient's old medical records in [place]  Chief Complaint: rectal bleeding   HPI: Terry Graves. is a 85 y.o. male long time smoker who lives with adult daughter with medical history significant for end-stage renal disease on hemodialysis Tuesday Thursday Saturday. He was hospitalized 2 weeks ago for mechanical complication associated with his left arm AV fistula and was treated by Dr. Monica Martinez and Dr. Oneida Alar. He has chronic atrial fibrillation and he is fully anticoagulated with Xarelto. He had an episode of GI bleeding in 2020 where he was seen by Dr. Laural Golden and upper endoscopy revealed a prepyloric ulcer. He reports he has not had any problems with GI bleeding since then. He reports that about 5 days ago he began to see blood in the stool. This started off a small amounts but has increased to very large amounts of blood in the stool and toilet. He showed his daughter who brought him to the emergency department for further treatment. He says that he stopped taking Xarelto yesterday due to his worry about the ongoing rectal bleeding. He continues to smoke cigarettes daily. He tolerated hemodialysis on Saturday with no difficulties.  ED Course: He arrived afebrile with normal vital signs. His pulse ox was 98% on room air. Sodium was 134, glucose 161, creatinine 5.52, albumin 3.1, hemoglobin 11.4, hematocrit 35.0. His hemoglobin on 618 was 13.9 and his hematocrit was 41.0 this during his recent hospitalization. His PT was 15.2 and INR 1.2. He was Hemoccult positive. His SARS 2 coronavirus test was negative. His EKG showed atrial fibrillation with a controlled rate. He is being admitted for further evaluation and management.  Review of Systems: As per HPI otherwise 10 point review of  systems negative.   Past Medical History:  Diagnosis Date  . Anemia   . Arthritis   . Atrial fibrillation ([place])   . CHF (congestive heart failure) ([place])   . Chronic kidney disease    Stage 5  . Chronic kidney disease-mineral and bone disorder   . Headache   . HOH (hard of hearing)   . Hyperlipidemia   . Hypertension   . Hypothyroidism   . Peripheral vascular disease ([place])   . Proteinuria   . Sleep apnea    wears CPAP 12  . Type 2 diabetes mellitus with stage 5 chronic kidney disease ([place])   . Wears glasses     Past Surgical History:  Procedure Laterality Date  . A/V FISTULAGRAM N/A 03/14/2020   Procedure: A/V FISTULAGRAM - left arm;  Surgeon: Elam Dutch, MD;  Location: [facility];  Service: Cardiovascular;  Laterality: N/A;  . AV FISTULA PLACEMENT Left 01/01/2020   Procedure: LEFT ARM ARTERIOVENOUS (AV) FISTULA CREATION;  Surgeon: Angelia Mould, MD;  Location: [facility];  Service: Vascular;  Laterality: Left;  . CATARACT EXTRACTION W/ INTRAOCULAR LENS  IMPLANT, BILATERAL    . COLONOSCOPY    . ESOPHAGOGASTRODUODENOSCOPY N/A 04/25/2019   Procedure: ESOPHAGOGASTRODUODENOSCOPY (EGD);  Surgeon: [name], MD;  [facility];  Service: Endoscopy;  Laterality: N/A;  . HIP ARTHROSCOPY Left   . INSERTION OF DIALYSIS CATHETER Right 01/01/2020   Procedure: INSERTION OF RIGHT INTERNAL JUGULAR DIALYSIS CATHETER;  Surgeon: Angelia Mould, MD;  Location: [facility];  Service: Vascular;  Laterality: Right;  . LIGATION OF COMPETING BRANCHES OF ARTERIOVENOUS FISTULA Left 03/28/2020   Procedure: SIDE BRANCH LIGATION TIMES THREE OF LEFT ARM  ARTERIOVENOUS FISTULA;  Surgeon: Marty Heck, MD;  Location: [facility];  Service: Vascular;  Laterality: Left;     reports that he has been smoking cigars. He has been smoking about 0.25 packs per day. He has never used smokeless tobacco. He reports current alcohol use. He reports that he does not use  drugs.  Allergies  Allergen Reactions  . Penicillins Rash and Other (See Comments)    Did it involve swelling of the face/tongue/throat, SOB, or low BP? No Did it involve sudden or severe rash/hives, skin peeling, or any reaction on the inside of your mouth or nose? Yes Did you need to seek medical attention at a hospital or doctor's office? Unknown When did it last happen?60 years If all above answers are "NO", may proceed with cephalosporin use.  . Quinine     UNSPECIFIED REACTION     Family History  Problem Relation Age of Onset  . Lung cancer Father     Prior to Admission medications   Medication Sig Start Date End Date Taking? Authorizing Provider  acetaminophen (TYLENOL) 650 MG CR tablet Take 1,300 mg by mouth in the morning and at bedtime.   Yes [provider]  atorvastatin (LIPITOR) 40 MG tablet Take 40 mg by mouth at bedtime.    Yes Fran Lowes, MD  carvedilol (COREG) 12.5 MG tablet Take 12.5 mg by mouth 2 (two) times daily. 11/07/19  Yes [provider]  docusate sodium (COLACE) 100 MG capsule Take 100 mg by mouth daily as needed for mild constipation.   Yes [provider]  HYDROcodone-acetaminophen (NORCO/VICODIN) 5-325 MG tablet Take 1 tablet by mouth every 4 (four) hours as needed for moderate pain. 03/28/20 03/28/21 Yes Baglia, Corrina, PA-C  levothyroxine (SYNTHROID) 25 MCG tablet Take 25 mcg by mouth daily before breakfast.   Yes [provider]  Methoxy PEG-Epoetin Beta (MIRCERA IJ) Inject as directed as needed (Low hemoglobin).   Yes [provider]  Multiple Vitamin (DAILY VITE PO) Take 800 mg by mouth daily. With Zinc   Yes [provider]  nitroGLYCERIN (NITROSTAT) 0.4 MG SL tablet Place 0.4 mg under the tongue every 5 (five) minutes as needed for chest pain.   Yes Fran Lowes, MD  pantoprazole (PROTONIX) 40 MG tablet Take 40 mg by mouth at bedtime.    Yes Fran Lowes, MD  rOPINIRole  (REQUIP) 0.5 MG tablet Take 0.5 mg by mouth at bedtime.   Yes [provider]  terazosin (HYTRIN) 1 MG capsule Take 1 mg by mouth at bedtime.   Yes Fran Lowes, MD  torsemide (DEMADEX) 20 MG tablet Take 40 mg by mouth 2 (two) times daily. 03/22/20  Yes [provider]  XARELTO 15 MG TABS tablet Take 15 mg by mouth daily with supper.  12/06/19   [provider]   Physical Exam: Vitals:   04/07/20 1013 04/07/20 1028 04/07/20 1043 04/07/20 1131  BP:  (!) 119/56  (!) 117/58  Pulse: 77 77 (!) 31 85  Resp: 16 18 16 17   Temp:      TempSrc:      SpO2: 90% 99% 90% 100%  Weight:      Height:       Constitutional: NAD, calm, comfortable, appears chronically ill, appears pale. Strong smell of tobacco smoke.  Eyes: PERRL, lids and conjunctivae normal  ENMT: Mucous membranes are pale and dry. Posterior pharynx clear of any exudate or lesions. Poor dentition.  Neck: normal, supple, no masses, no thyromegaly.  Respiratory: rare expiratory wheezing. Normal respiratory effort. No accessory muscle use.  Cardiovascular: irregularly irregular, no murmurs / rubs / gallops. No extremity edema. 2+ pedal pulses. No carotid bruits.  Abdomen: no tenderness, no masses palpated. No hepatosplenomegaly. Bowel sounds positive.  Musculoskeletal: no clubbing / cyanosis. No joint deformity upper and lower extremities. Good ROM, no contractures. Normal muscle tone.  Skin: no rashes, lesions, ulcers. No induration Neurologic: CN 2-12 grossly intact. Sensation intact, DTR normal. Strength 5/5 in all 4.  Psychiatric: Normal judgment and insight. Alert and oriented x 3. Normal mood.   Labs on Admission: I have personally reviewed following labs and imaging studies  CBC: Recent Labs  Lab 04/07/20 0900  WBC 8.4  HGB 11.4*  HCT 35.0*  MCV 92.8  PLT 505   Basic Metabolic Panel: Recent Labs  Lab 04/07/20 0900  NA 134*  K 4.2  CL 92*  CO2 28  GLUCOSE 161*  BUN 73*  CREATININE  5.52*  CALCIUM 8.6*   GFR: Estimated Creatinine Clearance: 10.3 mL/min (A) (by C-G formula based on SCr of 5.52 mg/dL (H)). Liver Function Tests: Recent Labs  Lab 04/07/20 0900  AST 24  ALT 21  ALKPHOS 90  BILITOT 0.7  PROT 6.9  ALBUMIN 3.1*   No results for input(s): LIPASE, AMYLASE in the last 168 hours. No results for input(s): AMMONIA in the last 168 hours. Coagulation Profile: Recent Labs  Lab 04/07/20 0900  INR 1.2   Cardiac Enzymes: No results for input(s): CKTOTAL, CKMB, CKMBINDEX, TROPONINI in the last 168 hours. BNP (last 3 results) No results for input(s): PROBNP in the last 8760 hours. HbA1C: No results for input(s): HGBA1C in the last 72 hours. CBG: No results for input(s): GLUCAP in the last 168 hours. Lipid Profile: No results for input(s): CHOL, HDL, LDLCALC, TRIG, CHOLHDL, LDLDIRECT in the last 72 hours. Thyroid Function Tests: No results for input(s): TSH, T4TOTAL, FREET4, T3FREE, THYROIDAB in the last 72 hours. Anemia Panel: No results for input(s): VITAMINB12, FOLATE, FERRITIN, TIBC, IRON, RETICCTPCT in the last 72 hours. Urine analysis: No results found for: COLORURINE, APPEARANCEUR, LABSPEC, PHURINE, GLUCOSEU, HGBUR, BILIRUBINUR, KETONESUR, PROTEINUR, UROBILINOGEN, NITRITE, LEUKOCYTESUR  Radiological Exams on Admission: No results found.  EKG: Independently reviewed. Atrial fibrillation with controlled rate.  Assessment/Plan Principal Problem:   GI bleeding Active Problems:   Gastrointestinal hemorrhage with melena   Hypertension   Anemia due to stage 5 chronic kidney disease (HCC)   Hyperlipidemia   Type 2 diabetes mellitus with stage 5 chronic kidney disease (HCC)   Acute blood loss anemia   Heme positive stool   CHF (congestive heart failure) (HCC)   HOH (hard of hearing)   Hypothyroidism   Sleep apnea   ESRD on hemodialysis ([place])   Hyperglycemia   History of Prepyloric ulcer   1. GI bleeding-patient does have a history of  prepyloric ulceration seen in 2020 and also has colonic diverticulosis. He is having frank melena and is lost a fairly large amount of blood in the last few days. He is being admitted to a telemetry monitored bed and supportive care started. He has been typed and screened. He has been started on IV Protonix. He was strongly advised to stop all tobacco use. I have consulted GI to see him.  He saw Dr. Laural Golden when he was admitted in 2020.  Clear liquid diet for now: No concentrated sweets or fruit juices except to treat a low blood glucose. 2. Acute blood loss anemia -he has been typed and screened and low threshold to transfuse if he has another serious bleeding event. Holding Xarelto while GI bleeding being worked up. 3. End-stage renal disease on hemodialysis-he receives treatments Tuesday Thursdays and Saturdays. I will notify the nephrology service. 4. OSA will offer nightly CPAP while in the hospital. 5. Type 2 diabetes mellitus-sensitive sliding scale coverage ordered and CBG monitoring. 6. Hypothyroidism-resume home levothyroxine. 7. Chronic diastolic congestive heart failure stable compensated. 8. Hyperlipidemia-resume home statin therapy. 9. Essential hypertension-blood pressure is currently well controlled and managed. We will follow.  DVT prophylaxis: SCDs Code Status: Full Family Communication: Daughter at bedside Disposition Plan: Home Consults called: GI and nephrology Admission status: INP  Terry Bellot MD [facility] How to contact the [facility] Attending or Consulting provider [place] or covering provider during after hours [place], for this patient?  1. Check the care team in [facility] and look for a) attending/consulting [facility] provider listed and b) the [facility] team listed 2. Log into [email or web address] and use [place]'s universal password to access. If you do not have the password, please contact the hospital operator. 3. Locate the [facility] provider you are looking for under [facility] and page to a number that you can be directly reached. 4. If you still have difficulty reaching the provider, please page the [facility] (Director on Call) for the Hospitalists listed on amion for assistance.   If 7PM-7AM, please contact night-coverage [email or web address] Password [facility]  04/07/2020, 12:08 PM

## 2020-04-07 NOTE — ED Notes (Signed)
ED TO INPATIENT HANDOFF REPORT  ED Nurse Name and Phone #:   S Name/Age/Gender Terry Graves. 85 y.o. male Room/Bed: APA05/APA05  Code Status   Code Status: Prior  Home/SNF/Other Home Patient oriented to: self, place, time and situation Is this baseline? Yes   Triage Complete: Triage complete  Chief Complaint GI bleeding [K92.2]  Triage Note Pt reports rectal bleeding for the past week.  Reports is on a blood thinner and stopped taking it yesterday.  Denies any n/v/d.  Denies any weakness, dizziness, or sob.  Pt recently had dialysis graft placed in left arm at [facility].      Allergies Allergies  Allergen Reactions  . Penicillins Rash and Other (See Comments)    Did it involve swelling of the face/tongue/throat, SOB, or low BP? No Did it involve sudden or severe rash/hives, skin peeling, or any reaction on the inside of your mouth or nose? Yes Did you need to seek medical attention at a hospital or doctor's office? Unknown When did it last happen?60 years If all above answers are "NO", may proceed with cephalosporin use.  . Quinine     UNSPECIFIED REACTION     Level of Care/Admitting Diagnosis ED Disposition    ED Disposition Condition [facility] Area: [facility] [[id number]]  Level of Care: Telemetry [5]  Covid Evaluation: Asymptomatic Screening Protocol (No Symptoms)  Diagnosis: GI bleeding [846962]  Admitting Physician: Emhouse, [place]  Attending Physician: Murlean Iba [[id number]]  Estimated length of stay: past midnight tomorrow  Certification:: I certify this patient will need inpatient services for at least 2 midnights       B Medical/Surgery History Past Medical History:  Diagnosis Date  . Anemia   . Arthritis   . Atrial fibrillation ([place])   . CHF (congestive heart failure) ([place])   . Chronic kidney disease    Stage 5  . Chronic kidney disease-mineral and bone disorder   . Headache   . HOH (hard of  hearing)   . Hyperlipidemia   . Hypertension   . Hypothyroidism   . Peripheral vascular disease ([place])   . Proteinuria   . Sleep apnea    wears CPAP 12  . Type 2 diabetes mellitus with stage 5 chronic kidney disease ([place])   . Wears glasses    Past Surgical History:  Procedure Laterality Date  . A/V FISTULAGRAM N/A 03/14/2020   Procedure: A/V FISTULAGRAM - left arm;  Surgeon: Elam Dutch, MD;  Location: [facility];  Service: Cardiovascular;  Laterality: N/A;  . AV FISTULA PLACEMENT Left 01/01/2020   Procedure: LEFT ARM ARTERIOVENOUS (AV) FISTULA CREATION;  Surgeon: Angelia Mould, MD;  Location: [facility];  Service: Vascular;  Laterality: Left;  . CATARACT EXTRACTION W/ INTRAOCULAR LENS  IMPLANT, BILATERAL    . COLONOSCOPY    . ESOPHAGOGASTRODUODENOSCOPY N/A 04/25/2019   Procedure: ESOPHAGOGASTRODUODENOSCOPY (EGD);  Surgeon: [name], MD;  [facility];  Service: Endoscopy;  Laterality: N/A;  . HIP ARTHROSCOPY Left   . INSERTION OF DIALYSIS CATHETER Right 01/01/2020   Procedure: INSERTION OF RIGHT INTERNAL JUGULAR DIALYSIS CATHETER;  Surgeon: Angelia Mould, MD;  Location: [facility];  Service: Vascular;  Laterality: Right;  . LIGATION OF COMPETING BRANCHES OF ARTERIOVENOUS FISTULA Left 03/28/2020   Procedure: SIDE BRANCH LIGATION TIMES THREE OF LEFT ARM  ARTERIOVENOUS FISTULA;  Surgeon: Marty Heck, MD;  Location: [facility];  Service: Vascular;  Laterality:  Left;     A IV Location/Drains/Wounds Patient Lines/Drains/Airways Status    Active Line/Drains/Airways    Name Placement date Placement time Site Days   Fistula / Graft Left Upper arm Arteriovenous fistula 01/01/20  1131  Upper arm  97   Hemodialysis Catheter Right Internal jugular Double lumen Permanent (Tunneled) 01/01/20  1053  Internal jugular  97   Incision (Closed) 01/01/20 Neck Right 01/01/20  1147   97   Incision (Closed) 01/01/20 Arm Left 01/01/20  1147   97   Incision (Closed)  03/28/20 Arm Left 03/28/20  0832   10   Pressure Injury 04/25/19 Sacrum Mid Stage II -  Partial thickness loss of dermis presenting as a shallow open ulcer with a red, pink wound bed without slough. 04/25/19  0000   348          Intake/Output Last 24 hours No intake or output data in the 24 hours ending 04/07/20 1244  Labs/Imaging Results for orders placed or performed during the hospital encounter of 04/07/20 (from the past 48 hour(s))  Comprehensive metabolic panel     Status: Abnormal   Collection Time: 04/07/20  9:00 AM  Result Value Ref Range   Sodium 134 (L) 135 - 145 mmol/L   Potassium 4.2 3.5 - 5.1 mmol/L   Chloride 92 (L) 98 - 111 mmol/L   CO2 28 22 - 32 mmol/L   Glucose, Bld 161 (H) 70 - 99 mg/dL    Comment: Glucose reference range applies only to samples taken after fasting for at least 8 hours.   BUN 73 (H) 8 - 23 mg/dL   Creatinine, Ser 5.52 (H) 0.61 - 1.24 mg/dL   Calcium 8.6 (L) 8.9 - 10.3 mg/dL   Total Protein 6.9 6.5 - 8.1 g/dL   Albumin 3.1 (L) 3.5 - 5.0 g/dL   AST 24 15 - 41 U/L   ALT 21 0 - 44 U/L   Alkaline Phosphatase 90 38 - 126 U/L   Total Bilirubin 0.7 0.3 - 1.2 mg/dL   GFR calc non Af Amer 9 (L) >60 mL/min   GFR calc Af Amer 10 (L) >60 mL/min   Anion gap 14 5 - 15    Comment: Performed at [facility], [address] [place], [place] [address]  CBC     Status: Abnormal   Collection Time: 04/07/20  9:00 AM  Result Value Ref Range   WBC 8.4 4.0 - 10.5 K/uL   RBC 3.77 (L) 4.22 - 5.81 MIL/uL   Hemoglobin 11.4 (L) 13.0 - 17.0 g/dL   HCT 35.0 (L) 39 - 52 %   MCV 92.8 80.0 - 100.0 fL   MCH 30.2 26.0 - 34.0 pg   MCHC 32.6 30.0 - 36.0 g/dL   RDW 13.9 11.5 - 15.5 %   Platelets 155 150 - 400 K/uL   nRBC 0.0 0.0 - 0.2 %    Comment: Performed at [facility], [address], [place], [place] [address]  Type and screen [facility]     Status: None   Collection Time: 04/07/20  9:00 AM  Result Value Ref Range   ABO/RH(D) A POS    Antibody Screen  NEG    Sample Expiration      04/10/2020,2359 Performed at [facility], [address] [place], [place] [address]   Protime-INR     Status: None   Collection Time: 04/07/20  9:00 AM  Result Value Ref Range   Prothrombin Time 15.2 11.4 -  15.2 seconds   INR 1.2 0.8 - 1.2    Comment: (NOTE) INR goal varies based on device and disease states. Performed at [facility], [address], [place], [place] [address]   POC occult blood, ED     Status: Abnormal   Collection Time: 04/07/20  9:19 AM  Result Value Ref Range   Fecal Occult Bld POSITIVE (A) NEGATIVE   No results found.  Pending Labs [employer] (From admission, onward) Comment          Start     Ordered   04/07/20 1056  SARS Coronavirus 2 by RT PCR (hospital order, performed in [facility] hospital lab) Nasopharyngeal Nasopharyngeal Swab  (Tier 2 (TAT 2 hrs))  ONCE - STAT,   STAT       Question Answer Comment  Is this test for diagnosis or screening Screening   Symptomatic for COVID-19 as defined by CDC No   Hospitalized for COVID-19 No   Admitted to ICU for COVID-19 No   Previously tested for COVID-19 Yes   Resident in a congregate (group) care setting No   Employed in healthcare setting No   Has patient completed COVID vaccination(s) (2 doses of Pfizer/Moderna 1 dose of [employer]) Yes      04/07/20 1055   Signed and Held  Hemoglobin A1c  Once,   R       Comments: To assess prior glycemic control    Signed and Held   Signed and Held  Magnesium  Daily,   R      Signed and Held   Signed and Held  Renal function panel  Daily,   R      Signed and Held   Signed and Held  CBC  Daily,   R      Signed and Held   Signed and Held  Protime-INR  Tomorrow morning,   R        Signed and Held          Vitals/Pain Today's Vitals   04/07/20 1028 04/07/20 1043 04/07/20 1131 04/07/20 1200  BP: (!) 119/56  (!) 117/58 (!) 117/59  Pulse: 77 (!) 31 85 95  Resp: 18 16 17 15   Temp:      TempSrc:      SpO2: 99% 90%  100% 96%  Weight:      Height:      PainSc:        Isolation Precautions No active isolations  Medications Medications - No data to display  Mobility  Moderate fall risk   Focused Assessments    R Recommendations: See Admitting Provider Note  Report given to:   Additional Notes:

## 2020-04-07 NOTE — Consult Note (Signed)
Terry Graves. Admit Date: 04/07/2020 04/07/2020 Terry Graves:  Terry Natal MD  Reason for Consult:  ESRD comanagement HPI:  [name] ESRD THS [place] admitted 04/07/20 afte presenting with melena and some BRBPR and Hb from 13 to 11.  Is on DOAC for AF.  Other PMH includes HTN, PVD, DM2.    Has hx/o PUD.    Seen by GI plan for EGD today.    He is currently up, bathing.  Mild focal LUQ pain reproduced with palpation.  No SOB, CP.  Says had some blood in BM earlier this AM.  Denies any problems with HD, using TDC at this time.    Pt s/p LUE AVF sidebranch ligation x3 6/18 with VVS.  Originally place 01/01/20.   ROS Balance of 12 systems is negative w/ exceptions as above  PMH  Past Medical History:  Diagnosis Date  . Anemia   . Arthritis   . Atrial fibrillation ([place])   . CHF (congestive heart failure) ([place])   . Chronic kidney disease    Stage 5  . Chronic kidney disease-mineral and bone disorder   . Headache   . HOH (hard of hearing)   . Hyperlipidemia   . Hypertension   . Hypothyroidism   . Peripheral vascular disease ([place])   . Proteinuria   . Sleep apnea    wears CPAP 12  . Type 2 diabetes mellitus with stage 5 chronic kidney disease ([place])   . Wears glasses    PSH  Past Surgical History:  Procedure Laterality Date  . A/V FISTULAGRAM N/A 03/14/2020   Procedure: A/V FISTULAGRAM - left arm;  Surgeon: Terry Dutch, MD;  Location: [facility];  Service: Cardiovascular;  Laterality: N/A;  . AV FISTULA PLACEMENT Left 01/01/2020   Procedure: LEFT ARM ARTERIOVENOUS (AV) FISTULA CREATION;  Surgeon: Terry Mould, MD;  Location: [facility];  Service: Vascular;  Laterality: Left;  . CATARACT EXTRACTION W/ INTRAOCULAR LENS  IMPLANT, BILATERAL    . COLONOSCOPY    . ESOPHAGOGASTRODUODENOSCOPY N/A 04/25/2019   Procedure: ESOPHAGOGASTRODUODENOSCOPY (EGD);  Surgeon: Terry Houston, MD;  [facility];  Service: Endoscopy;   Laterality: N/A;  . HIP ARTHROSCOPY Left   . INSERTION OF DIALYSIS CATHETER Right 01/01/2020   Procedure: INSERTION OF RIGHT INTERNAL JUGULAR DIALYSIS CATHETER;  Surgeon: Terry Mould, MD;  Location: [facility];  Service: Vascular;  Laterality: Right;  . LIGATION OF COMPETING BRANCHES OF ARTERIOVENOUS FISTULA Left 03/28/2020   Procedure: SIDE BRANCH LIGATION TIMES THREE OF LEFT ARM  ARTERIOVENOUS FISTULA;  Surgeon: Marty Heck, MD;  Location: [facility];  Service: Vascular;  Laterality: Left;   FH  Family History  Problem Relation Age of Onset  . Lung cancer Father    SH  reports that he has been smoking cigars. He has been smoking about 0.25 packs per day. He has never used smokeless tobacco. He reports current alcohol use. He reports that he does not use drugs. Allergies  Allergies  Allergen Reactions  . Penicillins Rash and Other (See Comments)    Did it involve swelling of the face/tongue/throat, SOB, or low BP? No Did it involve sudden or severe rash/hives, skin peeling, or any reaction on the inside of your mouth or nose? Yes Did you need to seek medical attention at a hospital or doctor's office? Unknown When did it last happen?60 years If all above answers are "NO", may proceed with cephalosporin use.  Marland Kitchen  Quinine     UNSPECIFIED REACTION    Home medications Prior to Admission medications   Medication Sig Start Date End Date Taking? Authorizing Provider  acetaminophen (TYLENOL) 650 MG CR tablet Take 1,300 mg by mouth in the morning and at bedtime.   Yes [provider]  atorvastatin (LIPITOR) 40 MG tablet Take 40 mg by mouth at bedtime.    Yes Terry Lowes, MD  carvedilol (COREG) 12.5 MG tablet Take 12.5 mg by mouth 2 (two) times daily. 11/07/19  Yes [provider]  docusate sodium (COLACE) 100 MG capsule Take 100 mg by mouth daily as needed for mild constipation.   Yes [provider]  HYDROcodone-acetaminophen (NORCO/VICODIN) 5-325  MG tablet Take 1 tablet by mouth every 4 (four) hours as needed for moderate pain. 03/28/20 03/28/21 Yes Terry Graves, Corrina, PA-C  levothyroxine (SYNTHROID) 25 MCG tablet Take 25 mcg by mouth daily before breakfast.   Yes [provider]  Methoxy PEG-Epoetin Beta (MIRCERA IJ) Inject as directed as needed (Low hemoglobin).   Yes [provider]  Multiple Vitamin (DAILY VITE PO) Take 800 mg by mouth daily. With Zinc   Yes [provider]  nitroGLYCERIN (NITROSTAT) 0.4 MG SL tablet Place 0.4 mg under the tongue every 5 (five) minutes as needed for chest pain.   Yes Terry Lowes, MD  pantoprazole (PROTONIX) 40 MG tablet Take 40 mg by mouth at bedtime.    Yes Terry Lowes, MD  rOPINIRole (REQUIP) 0.5 MG tablet Take 0.5 mg by mouth at bedtime.   Yes [provider]  terazosin (HYTRIN) 1 MG capsule Take 1 mg by mouth at bedtime.   Yes Terry Lowes, MD  torsemide (DEMADEX) 20 MG tablet Take 40 mg by mouth 2 (two) times daily. 03/22/20  Yes [provider]  XARELTO 15 MG TABS tablet Take 15 mg by mouth daily with supper.  12/06/19   [provider]    Current Medications Scheduled Meds: Continuous Infusions: PRN Meds:.  CBC Recent Labs  Lab 04/07/20 0900  WBC 8.4  HGB 11.4*  HCT 35.0*  MCV 92.8  PLT 213   Basic Metabolic Panel Recent Labs  Lab 04/07/20 0900  NA 134*  K 4.2  CL 92*  CO2 28  GLUCOSE 161*  BUN 73*  CREATININE 5.52*  CALCIUM 8.6*    Physical Exam  Blood pressure (!) 122/45, pulse 96, temperature 98.2 F (36.8 C), temperature source Oral, resp. rate 18, height 5\' 9"  (1.753 m), weight 83.2 kg, SpO2 98 %. GEN: NAD, up and ambulating in room ENT: NCAT EYES: EOMI CV: RRR nl s1s2 PULM: CTAB nl wob ABD: mild TTP in LUQ overlying inf margin of ribs, otherwise s/nt SKIN: R IJ TDC bandaged, LUE AVF with expected brusing EXT:no sig LEE NEURO: Nonfocal, intact VASC: LUE AVF +B/T   Assessment [name] ESRD THS  [facility] presenting with GIB  1. ESRD THS via [place] 2. ABLA / GIB, GI following for EGD today; hb > 10 3. AF on DOAC, xarelto, would apixaban be better choice given #1? 4. CKD-BMD 5. TDC preesnt, s/p ligation of sidebranch veins x3 6/18 6. Tobacco user 7. DM2 8. OSA on CPAP  Plan 1. HD today working around EGD schedule: 2K, 400/800, 3.5h, 1L UF, no heparin 2. Check Phos   Terry Agent  04/07/2020, 2:18 PM

## 2020-04-08 ENCOUNTER — Encounter (HOSPITAL_COMMUNITY): Payer: Self-pay | Admitting: Family Medicine

## 2020-04-08 ENCOUNTER — Encounter (HOSPITAL_COMMUNITY)
Admission: EM | Disposition: A | Discharge status: Home / Self Care | Payer: Self-pay | Source: Home / Self Care | Attending: Family Medicine

## 2020-04-08 DIAGNOSIS — D631 Anemia in chronic kidney disease: Secondary | ICD-10-CM

## 2020-04-08 DIAGNOSIS — I5032 Chronic diastolic (congestive) heart failure: Secondary | ICD-10-CM

## 2020-04-08 DIAGNOSIS — N185 Chronic kidney disease, stage 5: Secondary | ICD-10-CM

## 2020-04-08 DIAGNOSIS — K228 Other specified diseases of esophagus: Secondary | ICD-10-CM

## 2020-04-08 DIAGNOSIS — K766 Portal hypertension: Secondary | ICD-10-CM

## 2020-04-08 DIAGNOSIS — K3189 Other diseases of stomach and duodenum: Secondary | ICD-10-CM

## 2020-04-08 HISTORY — PX: ESOPHAGOGASTRODUODENOSCOPY: SHX5428

## 2020-04-08 LAB — CBC
HCT: 31.4 % — ABNORMAL LOW (ref 39.0–52.0)
Hemoglobin: 10.3 g/dL — ABNORMAL LOW (ref 13.0–17.0)
MCH: 30.1 pg (ref 26.0–34.0)
MCHC: 32.8 g/dL (ref 30.0–36.0)
MCV: 91.8 fL (ref 80.0–100.0)
Platelets: 154 10*3/uL (ref 150–400)
RBC: 3.42 MIL/uL — ABNORMAL LOW (ref 4.22–5.81)
RDW: 13.7 % (ref 11.5–15.5)
WBC: 7.6 10*3/uL (ref 4.0–10.5)
nRBC: 0 % (ref 0.0–0.2)

## 2020-04-08 LAB — PROTIME-INR
INR: 1.2 (ref 0.8–1.2)
Prothrombin Time: 14.3 seconds (ref 11.4–15.2)

## 2020-04-08 LAB — FERRITIN: Ferritin: 335 ng/mL (ref 24–336)

## 2020-04-08 LAB — GLUCOSE, CAPILLARY
Glucose-Capillary: 133 mg/dL — ABNORMAL HIGH (ref 70–99)
Glucose-Capillary: 125 mg/dL — ABNORMAL HIGH (ref 70–99)
Glucose-Capillary: 150 mg/dL — ABNORMAL HIGH (ref 70–99)
Glucose-Capillary: 115 mg/dL — ABNORMAL HIGH (ref 70–99)
Glucose-Capillary: 167 mg/dL — ABNORMAL HIGH (ref 70–99)

## 2020-04-08 LAB — PHOSPHORUS: Phosphorus: 4.2 mg/dL (ref 2.5–4.6)

## 2020-04-08 LAB — TRANSFERRIN: Transferrin: 109 mg/dL — ABNORMAL LOW (ref 180–329)

## 2020-04-08 LAB — MAGNESIUM: Magnesium: 1.8 mg/dL (ref 1.7–2.4)

## 2020-04-08 SURGERY — EGD (ESOPHAGOGASTRODUODENOSCOPY)
Anesthesia: Moderate Sedation

## 2020-04-08 MED ORDER — SODIUM CHLORIDE 0.9 % IV SOLN: INTRAVENOUS | Status: DC

## 2020-04-08 MED ORDER — BISACODYL 5 MG PO TBEC
10.0000 mg | DELAYED_RELEASE_TABLET | Freq: Once | ORAL | Status: AC
  Administered 2020-04-08: 10 mg via ORAL
  Filled 2020-04-08: qty 2

## 2020-04-08 MED ORDER — MIDAZOLAM HCL 5 MG/5ML IJ SOLN
INTRAMUSCULAR | Status: AC
  Filled 2020-04-08: qty 5

## 2020-04-08 MED ORDER — FENTANYL CITRATE (PF) 100 MCG/2ML IJ SOLN
INTRAMUSCULAR | Status: DC | PRN
  Administered 2020-04-08: 25 ug via INTRAVENOUS

## 2020-04-08 MED ORDER — LIDOCAINE VISCOUS HCL 2 % MT SOLN
OROMUCOSAL | Status: AC
  Filled 2020-04-08: qty 15

## 2020-04-08 MED ORDER — LIDOCAINE VISCOUS HCL 2 % MT SOLN
OROMUCOSAL | Status: DC | PRN
  Administered 2020-04-08: 1 via OROMUCOSAL

## 2020-04-08 MED ORDER — MIDAZOLAM HCL 5 MG/5ML IJ SOLN
INTRAMUSCULAR | Status: DC | PRN
  Administered 2020-04-08 (×2): 1 mg via INTRAVENOUS

## 2020-04-08 MED ORDER — HEPARIN SODIUM (PORCINE) 1000 UNIT/ML IJ SOLN
3400.0000 [IU] | Freq: Once | INTRAMUSCULAR | Status: AC
  Administered 2020-04-08: 3400 [IU]
  Filled 2020-04-08: qty 4

## 2020-04-08 MED ORDER — CHLORHEXIDINE GLUCONATE CLOTH 2 % EX PADS
6.0000 | MEDICATED_PAD | Freq: Every day | CUTANEOUS | Status: DC
  Administered 2020-04-09: 6 via TOPICAL

## 2020-04-08 MED ORDER — FENTANYL CITRATE (PF) 100 MCG/2ML IJ SOLN
INTRAMUSCULAR | Status: AC
  Filled 2020-04-08: qty 2

## 2020-04-08 MED ORDER — PEG 3350-KCL-NA BICARB-NACL 420 G PO SOLR
4000.0000 mL | Freq: Once | ORAL | Status: AC
  Administered 2020-04-09: 4000 mL via ORAL

## 2020-04-08 NOTE — Progress Notes (Signed)
Brief EGD note.  Normal mucosa of esophagus and GE junction. Mild diffuse portal gastropathy but no evidence of ulcers erosions or other abnormalities. Normal bulbar and post bulbar mucosa.

## 2020-04-08 NOTE — Op Note (Signed)
[facility] Patient Name: Terry Graves Procedure Date: 04/08/2020 7:13 AM MRN: [id number] Date of Birth: 1935/07/28 Attending MD: Hildred Laser , MD CSN: [id number] Age: 85 Admit Type: Inpatient Procedure:                Upper GI endoscopy Indications:              Suspected upper gastrointestinal bleeding Providers:                Hildred Laser, MD, Crystal Page, Janeece Riggers, RN Referring MD:             Irwin Brakeman, MD Medicines:                Lidocaine spray, Fentanyl 25 micrograms IV,                            Midazolam 2 mg IV Complications:            No immediate complications. Estimated Blood Loss:     Estimated blood loss: none. Estimated blood loss:                            none. Procedure:                Pre-Anesthesia Assessment:                           - Prior to the procedure, a History and Physical                            was performed, and patient medications and                            allergies were reviewed. The patient's tolerance of                            previous anesthesia was also reviewed. The risks                            and benefits of the procedure and the sedation                            options and risks were discussed with the patient.                            All questions were answered, and informed consent                            was obtained. Prior Anticoagulants: The patient                            last took Xarelto (rivaroxaban) 3 days prior to the                            procedure. ASA Grade Assessment: III - A patient  with severe systemic disease. After reviewing the                            risks and benefits, the patient was deemed in                            satisfactory condition to undergo the procedure.                           After obtaining informed consent, the endoscope was                            passed under direct vision. Throughout the                             procedure, the patient's blood pressure, pulse, and                            oxygen saturations were monitored continuously. The                            [id number]-H190 ([id number]) scope was introduced through the                            mouth, and advanced to the second part of duodenum.                            The upper GI endoscopy was accomplished without                            difficulty. The patient tolerated the procedure                            well. Scope In: 7:44:16 AM Scope Out: 7:51:09 AM Total Procedure Duration: 0 hours 6 minutes 53 seconds  Findings:      The hypopharynx was normal.      The examined esophagus was normal.      The Z-line was irregular and was found 42 cm from the incisors.      Mild portal hypertensive gastropathy was found in the entire examined       stomach.      A few small sessile polyps with no stigmata of recent bleeding were       found in the gastric body.      The duodenal bulb and second portion of the duodenum were normal. Impression:               - Normal hypopharynx.                           - Normal esophagus.                           - Z-line irregular, 42 cm from the incisors.                           -  Portal hypertensive gastropathy.                           Few small hyperplastic appearing polyps at body.                            These were left alone.                           - Normal duodenal bulb and second portion of the                            duodenum.                           - No specimens collected.                           comment; gastric ulcer has healed since EGD of                            July, 2020.                           no bleeding lesion notedin UGI tract. Moderate Sedation:      Moderate (conscious) sedation was administered by the endoscopy nurse       and supervised by the endoscopist. The following parameters were       monitored: oxygen saturation, heart rate, blood  pressure, CO2       capnography and response to care. Total physician intraservice time was       12 minutes. Recommendation:           - Return patient to hospital ward for ongoing care.                           - Clear liquid diet today.                           - Continue present medications.                           - Perform a colonoscopy tomorrow. Procedure Code(s):        --- Professional ---                           [phone], Esophagogastroduodenoscopy, flexible,                            transoral; diagnostic, including collection of                            specimen(s) by brushing or washing, when performed                            (separate procedure)                           G0500,  Moderate sedation services provided by the                            same physician or other qualified health care                            professional performing a gastrointestinal                            endoscopic service that sedation supports,                            requiring the presence of an independent trained                            observer to assist in the monitoring of the                            patient's level of consciousness and physiological                            status; initial 15 minutes of intra-service time;                            patient age 17 years or older (additional time may                            be reported with [phone], as appropriate) Diagnosis Code(s):        --- Professional ---                           K22.8, Other specified diseases of esophagus                           K76.6, Portal hypertension                           K31.89, Other diseases of stomach and duodenum CPT copyright 2019 American Medical Association. All rights reserved. The codes documented in this report are preliminary and upon coder review may  be revised to meet current compliance requirements. Hildred Laser, MD Hildred Laser, MD 04/08/2020 8:06:34  AM This report has been signed electronically. Number of Addenda: 0

## 2020-04-08 NOTE — Procedures (Signed)
     HEMODIALYSIS TREATMENT NOTE:  3.5 hour heparin-free HD completed via RIJ TDC; exit site unremarkable.  Unable to tolerate removal of 1 L as ordered due to soft blood pressures.  Keeping SBP>105 as ordered, net UF 464cc.  All blood was returned.  No changes from pre-HD assessment.  Rockwell Alexandria, RN

## 2020-04-08 NOTE — Progress Notes (Addendum)
Patient states he had 2 bowel movements since yesterday afternoon.  These were small volume.  He continues to complain of soreness over his left rib cage and left upper quadrant.  He denies nausea vomiting shortness of breath.

## 2020-04-08 NOTE — Progress Notes (Signed)
PROGRESS NOTE   Terry Graves.  GEZ:[id number] DOB: March 19, 1935 DOA: 04/07/2020 PCP: [name], MD   Chief Complaint  Patient presents with  . GI Bleeding    Brief Admission History:  85 y.o. male long time smoker who lives with adult daughter with medical history significant for end-stage renal disease on hemodialysis Tuesday Thursday Saturday. He was hospitalized 2 weeks ago for mechanical complication associated with his left arm AV fistula and was treated by Dr. Monica Martinez and Dr. Oneida Alar. He has chronic atrial fibrillation and he is fully anticoagulated with Xarelto. He had an episode of GI bleeding in 2020 where he was seen by Dr. Laural Golden and upper endoscopy revealed a prepyloric ulcer.  He was admitted with GI bleeding and acute blood loss anemia.    Assessment & Plan:   Principal Problem:   GI bleeding Active Problems:   Gastrointestinal hemorrhage with melena   Hypertension   Anemia due to stage 5 chronic kidney disease (HCC)   Hyperlipidemia   Type 2 diabetes mellitus with stage 5 chronic kidney disease (HCC)   Acute blood loss anemia   Heme positive stool   CHF (congestive heart failure) (HCC)   HOH (hard of hearing)   Hypothyroidism   Sleep apnea   ESRD on hemodialysis ([place])   Hyperglycemia   History of Prepyloric ulcer  1. GI bleeding - EGD did not show a source for the bleeding.  Dr. Laural Golden planning to pursue colono survey.  Continue to monitor H/H.  He has been typed and screened for PRBCs.   2. Acute blood loss anemia - Hg down to 10.3.  Follow.  Hold Xarelto.  3. ESRD on HD - Dr. Joelyn Oms consulted and planning HD later today.  Follow Renal function panel. 4. OSA - offer nightly CPAP.  5. Type 2 DM - very sensitive SSI coverage.  6. Hypothyroidism - resume home levothyroxine.  7. Hyperlipidemia - resume home statin therapy.  8. HTN - stable.  9. Chronic diastolic CHF - stable. Compensated.   DVT prophylaxis: SCD Code Status: full  Family  Communication: daughter  Disposition: Home   Status is: Inpatient  Remains inpatient appropriate because:Ongoing diagnostic testing needed not appropriate for outpatient work up and Inpatient level of care appropriate due to severity of illness.  FURTHER WORK UP FOR ONGOING GI BLEED IN PROGRESS.    Dispo: The patient is from: Home              Anticipated d/c is to: Home              Anticipated d/c date is: 3 days              Patient currently is not medically stable to d/c.   Consultants:   GI  Procedures:   EGD: 04/08/20 Dr. Laural Golden Brief EGD note. Normal mucosa of esophagus and GE junction. Mild diffuse portal gastropathy but no evidence of ulcers erosions or other abnormalities. Normal bulbar and post bulbar mucosa.  Antimicrobials:    Subjective: Pt seen after EGD, he tolerated well.  HD planned for today. He is agreeable to colonoscopy.   Objective: Vitals:   04/08/20 0749 04/08/20 0754 04/08/20 0759 04/08/20 0829  BP: (!) 87/36 (!) 85/34 (!) 90/39 (!) 103/49  Pulse: 73 72 75 69  Resp: 18 17 (!) 22 20  Temp:    97.7 F (36.5 C)  TempSrc:    Oral  SpO2: 100% 100% 100% 96%  Weight:  Height:        Intake/Output Summary (Last 24 hours) at 04/08/2020 1125 Last data filed at 04/08/2020 0802 Gross per 24 hour  Intake 1020 ml  Output --  Net 1020 ml   Filed Weights   04/07/20 0845 04/07/20 1402 04/08/20 0717  Weight: 83 kg 83.2 kg 81.6 kg    Examination:  General exam: chronically ill appearing male, Appears calm and comfortable  Respiratory system: Clear to auscultation. Respiratory effort normal. Cardiovascular system: S1 & S2 heard, RRR. No JVD, murmurs, rubs, gallops or clicks. No pedal edema. Gastrointestinal system: Abdomen is nondistended, soft and nontender. No organomegaly or masses felt. Normal bowel sounds heard. Central nervous system: Alert and oriented. No focal neurological deficits. Extremities: left AVF in place. Skin: No rashes,  lesions or ulcers Psychiatry: Judgement and insight appear normal. Mood & affect appropriate.   Data Reviewed: I have personally reviewed following labs and imaging studies  CBC: Recent Labs  Lab 04/07/20 0900 04/08/20 0448  WBC 8.4 7.6  HGB 11.4* 10.3*  HCT 35.0* 31.4*  MCV 92.8 91.8  PLT 155 734    Basic Metabolic Panel: Recent Labs  Lab 04/07/20 0900 04/08/20 0448  NA 134*  --   K 4.2  --   CL 92*  --   CO2 28  --   GLUCOSE 161*  --   BUN 73*  --   CREATININE 5.52*  --   CALCIUM 8.6*  --   MG  --  1.8    GFR: Estimated Creatinine Clearance: 10.3 mL/min (A) (by C-G formula based on SCr of 5.52 mg/dL (H)).  Liver Function Tests: Recent Labs  Lab 04/07/20 0900  AST 24  ALT 21  ALKPHOS 90  BILITOT 0.7  PROT 6.9  ALBUMIN 3.1*    CBG: Recent Labs  Lab 04/07/20 1624 04/07/20 2031 04/08/20 0317 04/08/20 0811  GLUCAP 95 237* 133* 125*    Recent Results (from the past 240 hour(s))  SARS Coronavirus 2 by RT PCR (hospital order, performed in [facility] hospital lab) Nasopharyngeal Nasopharyngeal Swab     Status: None   Collection Time: 04/07/20 11:23 AM   Specimen: Nasopharyngeal Swab  Result Value Ref Range Status   SARS Coronavirus 2 NEGATIVE NEGATIVE Final    Comment: (NOTE) SARS-CoV-2 target nucleic acids are NOT DETECTED.  The SARS-CoV-2 RNA is generally detectable in upper and lower respiratory specimens during the acute phase of infection. The lowest concentration of SARS-CoV-2 viral copies this assay can detect is 250 copies / mL. A negative result does not preclude SARS-CoV-2 infection and should not be used as the sole basis for treatment or other patient management decisions.  A negative result may occur with improper specimen collection / handling, submission of specimen other than nasopharyngeal swab, presence of viral mutation(s) within the areas targeted by this assay, and inadequate number of viral copies (<250 copies / mL). A  negative result must be combined with clinical observations, patient history, and epidemiological information.  Fact Sheet for Patients:   StrictlyIdeas.no  Fact Sheet for Healthcare Providers: BankingDealers.co.za  This test is not yet approved or  cleared by the [place] FDA and has been authorized for detection and/or diagnosis of SARS-CoV-2 by FDA under an Emergency Use Authorization (EUA).  This EUA will remain in effect (meaning this test can be used) for the duration of the COVID-19 declaration under Section 564(b)(1) of the Act, 21 U.S.C. section 360bbb-3(b)(1), unless the authorization is terminated or revoked sooner.  Performed at [facility], [address] [place], [place] [address]      Radiology Studies: No results found.   Scheduled Meds: . atorvastatin  40 mg Oral QHS  . carvedilol  12.5 mg Oral BID WC  . insulin aspart  0-6 Units Subcutaneous TID WC  . levothyroxine  25 mcg Oral QAC breakfast  . nicotine  21 mg Transdermal Daily  . pantoprazole (PROTONIX) IV  40 mg Intravenous Q12H  . rOPINIRole  0.5 mg Oral QHS  . sodium chloride flush  3 mL Intravenous Q12H  . terazosin  1 mg Oral QHS   Continuous Infusions: . sodium chloride       LOS: 1 day   Time spent: 18 minutes    Shawnise Peterkin Wynetta Emery, MD How to contact the [facility] Attending or Consulting provider [place] or covering provider during after hours Person, for this patient?  1. Check the care team in [facility] and look for a) attending/consulting TRH provider listed and b) the [facility] team listed 2. Log into [email or web address] and use [place]'s universal password to access. If you do not have the password, please contact the hospital operator. 3. Locate the [facility] provider you are looking for under [facility] and page to a number that you can be directly reached. 4. If you still have difficulty reaching the provider, please page the [facility] (Director on Call) for  the Hospitalists listed on amion for assistance.  04/08/2020, 11:25 AM

## 2020-04-08 NOTE — Progress Notes (Signed)
Talked with patient and he is agreeable to proceed with colonoscopy. I also talked with his daughter Ms. [name] over the phone earlier today. Patient will be prepped with [facility] starting tomorrow morning and undergo colonoscopy tomorrow afternoon.

## 2020-04-09 ENCOUNTER — Encounter (HOSPITAL_COMMUNITY)
Admission: EM | Disposition: A | Discharge status: Home / Self Care | Payer: Self-pay | Source: Home / Self Care | Attending: Family Medicine

## 2020-04-09 ENCOUNTER — Encounter (HOSPITAL_COMMUNITY): Payer: Self-pay | Admitting: Family Medicine

## 2020-04-09 DIAGNOSIS — K5730 Diverticulosis of large intestine without perforation or abscess without bleeding: Secondary | ICD-10-CM

## 2020-04-09 DIAGNOSIS — K644 Residual hemorrhoidal skin tags: Secondary | ICD-10-CM

## 2020-04-09 DIAGNOSIS — I509 Heart failure, unspecified: Secondary | ICD-10-CM

## 2020-04-09 HISTORY — PX: COLONOSCOPY: SHX5424

## 2020-04-09 LAB — RENAL FUNCTION PANEL
Albumin: 2.5 g/dL — ABNORMAL LOW (ref 3.5–5.0)
Anion gap: 9 (ref 5–15)
BUN: 30 mg/dL — ABNORMAL HIGH (ref 8–23)
CO2: 27 mmol/L (ref 22–32)
Calcium: 8.2 mg/dL — ABNORMAL LOW (ref 8.9–10.3)
Chloride: 96 mmol/L — ABNORMAL LOW (ref 98–111)
Creatinine, Ser: 3.15 mg/dL — ABNORMAL HIGH (ref 0.61–1.24)
GFR calc Af Amer: 20 mL/min — ABNORMAL LOW (ref >60)
GFR calc non Af Amer: 17 mL/min — ABNORMAL LOW (ref >60)
Glucose, Bld: 123 mg/dL — ABNORMAL HIGH (ref 70–99)
Phosphorus: 4 mg/dL (ref 2.5–4.6)
Potassium: 3.1 mmol/L — ABNORMAL LOW (ref 3.5–5.1)
Sodium: 132 mmol/L — ABNORMAL LOW (ref 135–145)

## 2020-04-09 LAB — GLUCOSE, CAPILLARY
Glucose-Capillary: 113 mg/dL — ABNORMAL HIGH (ref 70–99)
Glucose-Capillary: 95 mg/dL (ref 70–99)
Glucose-Capillary: 115 mg/dL — ABNORMAL HIGH (ref 70–99)
Glucose-Capillary: 97 mg/dL (ref 70–99)
Glucose-Capillary: 169 mg/dL — ABNORMAL HIGH (ref 70–99)

## 2020-04-09 LAB — CBC
HCT: 30.3 % — ABNORMAL LOW (ref 39.0–52.0)
Hemoglobin: 9.8 g/dL — ABNORMAL LOW (ref 13.0–17.0)
MCH: 29.9 pg (ref 26.0–34.0)
MCHC: 32.3 g/dL (ref 30.0–36.0)
MCV: 92.4 fL (ref 80.0–100.0)
Platelets: 137 10*3/uL — ABNORMAL LOW (ref 150–400)
RBC: 3.28 MIL/uL — ABNORMAL LOW (ref 4.22–5.81)
RDW: 13.5 % (ref 11.5–15.5)
WBC: 5.5 10*3/uL (ref 4.0–10.5)
nRBC: 0 % (ref 0.0–0.2)

## 2020-04-09 LAB — MAGNESIUM: Magnesium: 1.7 mg/dL (ref 1.7–2.4)

## 2020-04-09 SURGERY — COLONOSCOPY
Anesthesia: Moderate Sedation

## 2020-04-09 MED ORDER — ONDANSETRON HCL 4 MG PO TABS: 4.0000 mg | ORAL_TABLET | Freq: Four times a day (QID) | ORAL | 0 refills | Status: DC | PRN

## 2020-04-09 MED ORDER — DARBEPOETIN ALFA 60 MCG/0.3ML IJ SOSY
60.0000 ug | PREFILLED_SYRINGE | INTRAMUSCULAR | Status: DC
  Filled 2020-04-09: qty 0.3

## 2020-04-09 MED ORDER — MIDAZOLAM HCL 5 MG/5ML IJ SOLN
INTRAMUSCULAR | Status: AC
  Filled 2020-04-09: qty 10

## 2020-04-09 MED ORDER — NICOTINE 14 MG/24HR TD PT24: 14.0000 mg | MEDICATED_PATCH | Freq: Every day | TRANSDERMAL | 1 refills | Status: DC

## 2020-04-09 MED ORDER — FENTANYL CITRATE (PF) 100 MCG/2ML IJ SOLN
INTRAMUSCULAR | Status: DC | PRN
  Administered 2020-04-09: 25 ug via INTRAVENOUS

## 2020-04-09 MED ORDER — FENTANYL CITRATE (PF) 100 MCG/2ML IJ SOLN
INTRAMUSCULAR | Status: AC
  Filled 2020-04-09: qty 2

## 2020-04-09 MED ORDER — SODIUM CHLORIDE 0.9 % IV SOLN
INTRAVENOUS | Status: DC
  Administered 2020-04-09: 1000 mL via INTRAVENOUS

## 2020-04-09 MED ORDER — MIDAZOLAM HCL 5 MG/5ML IJ SOLN
INTRAMUSCULAR | Status: DC | PRN
  Administered 2020-04-09 (×2): 1 mg via INTRAVENOUS

## 2020-04-09 MED ORDER — XARELTO 15 MG PO TABS: 15.0000 mg | ORAL_TABLET | Freq: Every day | ORAL | 0 refills | Status: DC

## 2020-04-09 NOTE — Care Management Important Message (Signed)
Important Message  Patient Details  Name: Terry Graves. MRN: [id number] Date of Birth: 06-29-1935   Medicare Important Message Given:  Yes     Tommy Medal 04/09/2020, 4:32 PM

## 2020-04-09 NOTE — Progress Notes (Signed)
IV removed, 2x2 gauze and paper tape applied to site, patient tolerated well. Reviewed AVS with patient and patient's daughter, who verbalized understanding. Patient taken to front lobby via wheelchair and transported home by his daughter.

## 2020-04-09 NOTE — Progress Notes (Addendum)
PROGRESS NOTE   Terry Graves.  XNA:[id number] DOB: 1935/05/06 DOA: 04/07/2020 PCP: [name], MD   Chief Complaint  Patient presents with  . GI Bleeding    Brief Admission History:  85 y.o. male long time smoker who lives with adult daughter with medical history significant for end-stage renal disease on hemodialysis Tuesday Thursday Saturday. He was hospitalized 2 weeks ago for mechanical complication associated with his left arm AV fistula and was treated by Dr. Monica Martinez and Dr. Oneida Alar. He has chronic atrial fibrillation and he is fully anticoagulated with Xarelto. He had an episode of GI bleeding in 2020 where he was seen by Dr. Laural Golden and upper endoscopy revealed a prepyloric ulcer.  He was admitted with GI bleeding and acute blood loss anemia.    Assessment & Plan:   Principal Problem:   GI bleeding Active Problems:   Gastrointestinal hemorrhage with melena   Hypertension   Anemia due to stage 5 chronic kidney disease (HCC)   Hyperlipidemia   Type 2 diabetes mellitus with stage 5 chronic kidney disease (HCC)   Acute blood loss anemia   Heme positive stool   CHF (congestive heart failure) (HCC)   HOH (hard of hearing)   Hypothyroidism   Sleep apnea   ESRD on hemodialysis ([place])   Hyperglycemia   History of Prepyloric ulcer    1)Acute GI bleed - EGD on 04/08/20 did not show a source for the bleeding.   --Currently tolerating colon prep well for planned colonoscopy 04/09/2020 -Currently on IV Protonix  2)Acute on chronic anemia secondary to ABLA--patient has baseline anemia of ESRD, however recent baseline hemoglobin usually above 13, -On admission hemoglobin was 11.4 on 04/07/2020, hemoglobin down to 9.8, -Patient receives ESA agent with HD sessions weekly, -Okay to transfuse if clinically indicated -Endoluminal evaluation as above #1 -Continue to hold Xarelto  3)ESRD on HD - Dr. Joelyn Oms consulted , tolerated HD well on 04/08/2020 Next HD session with  ESA and without heparin is planned for 04/10/2020   4)OSA - c/n  nightly CPAP.   5)DM2-  Last A1c 7.3, reflecting uncontrolled DM PTA, Allow some permissive Hyperglycemia as pt is NPO for colonoscopy rather than risk life-threatening hypoglycemia in a patient with unreliable oral intake. Use very sensitive Novolog/Humalog Sliding scale insulin with Accu-Cheks/Fingersticks as ordered  6)hypothyroidism - c/n  levothyroxine.    7)HFrEF--history of chronic systolic dysfunction CHF, last known EF 35 to 40% based on echo from July 2020 -Appears stable/compensated at this time -Continue to use hemodialysis to address volume status -Continue Coreg 12.5 mg twice daily  8)HLD-stable continue Lipitor 40 mg every afternoon  9)BPH--stable, continue Hytrin 1 mg nightly  DVT prophylaxis: SCD Code Status: full  Family Communication: daughter  Disposition: Home   Status is: Inpatient  Remains inpatient appropriate because:Ongoing diagnostic testing needed not appropriate for outpatient work up and Inpatient level of care appropriate due to severity of illness.  FURTHER WORK UP FOR ONGOING GI BLEED IN PROGRESS.    Dispo: The patient is from: Home              Anticipated d/c is to: Home              Anticipated d/c date is: 3 days              Patient currently is not medically stable to d/c.   Consultants:   GI  Procedures:   EGD: 04/08/20 Dr. Laural Golden Brief EGD note. Normal mucosa  of esophagus and GE junction. Mild diffuse portal gastropathy but no evidence of ulcers erosions or other abnormalities. Normal bulbar and post bulbar mucosa.  Antimicrobials:    Subjective: -Having multiple watery stools due to colon prep -No chest pains, no shortness of breath, no dizziness  Objective: Vitals:   04/08/20 2028 04/08/20 2049 04/09/20 0559 04/09/20 0842  BP:  (!) 117/51 (!) 113/56   Pulse:  79 63   Resp:      Temp:  98.4 F (36.9 C) 97.9 F (36.6 C)   TempSrc:  Oral Oral   SpO2: 95%  97% 97% 95%  Weight:      Height:        Intake/Output Summary (Last 24 hours) at 04/09/2020 1219 Last data filed at 04/09/2020 0920 Gross per 24 hour  Intake 360 ml  Output 456 ml  Net -96 ml   Filed Weights   04/07/20 1402 04/08/20 0717 04/08/20 1430  Weight: 83.2 kg 81.6 kg 81.5 kg    Examination:  General exam: chronically ill appearing male, in no acute distress respiratory system: Clear to auscultation. Respiratory effort normal. NECK-right HD catheter Cardiovascular system: S1 & S2 heard, RRR. No JVD, murmurs, rubs, gallops or clicks. No pedal edema. Gastrointestinal system: Abdomen is nondistended, soft and nontender.Normal bowel sounds heard. Central nervous system: Alert and oriented. No focal neurological deficits. Extremities: left AVF in place. Skin: No rashes, lesions or ulcers Psychiatry: Judgement and insight appear normal. Mood & affect appropriate.   Data Reviewed:   CBC: Recent Labs  Lab 04/07/20 0900 04/08/20 0448 04/09/20 0455  WBC 8.4 7.6 5.5  HGB 11.4* 10.3* 9.8*  HCT 35.0* 31.4* 30.3*  MCV 92.8 91.8 92.4  PLT 155 154 137*    Basic Metabolic Panel: Recent Labs  Lab 04/07/20 0900 04/08/20 0448 04/08/20 1519 04/09/20 0455  NA 134*  --   --  132*  K 4.2  --   --  3.1*  CL 92*  --   --  96*  CO2 28  --   --  27  GLUCOSE 161*  --   --  123*  BUN 73*  --   --  30*  CREATININE 5.52*  --   --  3.15*  CALCIUM 8.6*  --   --  8.2*  MG  --  1.8  --  1.7  PHOS  --   --  4.2 4.0    GFR: Estimated Creatinine Clearance: 18.1 mL/min (A) (by C-G formula based on SCr of 3.15 mg/dL (H)).  Liver Function Tests: Recent Labs  Lab 04/07/20 0900 04/09/20 0455  AST 24  --   ALT 21  --   ALKPHOS 90  --   BILITOT 0.7  --   PROT 6.9  --   ALBUMIN 3.1* 2.5*    CBG: Recent Labs  Lab 04/08/20 1654 04/08/20 2025 04/09/20 0327 04/09/20 0751 04/09/20 1117  GLUCAP 115* 167* 113* 95 115*    Recent Results (from the past 240 hour(s))  SARS  Coronavirus 2 by RT PCR (hospital order, performed in [facility] hospital lab) Nasopharyngeal Nasopharyngeal Swab     Status: None   Collection Time: 04/07/20 11:23 AM   Specimen: Nasopharyngeal Swab  Result Value Ref Range Status   SARS Coronavirus 2 NEGATIVE NEGATIVE Final    Comment: (NOTE) SARS-CoV-2 target nucleic acids are NOT DETECTED.  The SARS-CoV-2 RNA is generally detectable in upper and lower respiratory specimens during the acute phase of infection. The lowest  concentration of SARS-CoV-2 viral copies this assay can detect is 250 copies / mL. A negative result does not preclude SARS-CoV-2 infection and should not be used as the sole basis for treatment or other patient management decisions.  A negative result may occur with improper specimen collection / handling, submission of specimen other than nasopharyngeal swab, presence of viral mutation(s) within the areas targeted by this assay, and inadequate number of viral copies (<250 copies / mL). A negative result must be combined with clinical observations, patient history, and epidemiological information.  Fact Sheet for Patients:   StrictlyIdeas.no  Fact Sheet for Healthcare Providers: BankingDealers.co.za  This test is not yet approved or  cleared by the [place] FDA and has been authorized for detection and/or diagnosis of SARS-CoV-2 by FDA under an Emergency Use Authorization (EUA).  This EUA will remain in effect (meaning this test can be used) for the duration of the COVID-19 declaration under Section 564(b)(1) of the Act, 21 U.S.C. section 360bbb-3(b)(1), unless the authorization is terminated or revoked sooner.  Performed at [facility], [address], [place], [place] [address]      Radiology Studies: No results found.   Scheduled Meds: . atorvastatin  40 mg Oral QHS  . carvedilol  12.5 mg Oral BID WC  . Chlorhexidine Gluconate Cloth  6 each Topical  Daily  . [START ON 04/10/2020] darbepoetin (ARANESP) injection - DIALYSIS  60 mcg Intravenous Q Thu-HD  . insulin aspart  0-6 Units Subcutaneous TID WC  . levothyroxine  25 mcg Oral QAC breakfast  . nicotine  21 mg Transdermal Daily  . pantoprazole (PROTONIX) IV  40 mg Intravenous Q12H  . rOPINIRole  0.5 mg Oral QHS  . sodium chloride flush  3 mL Intravenous Q12H  . terazosin  1 mg Oral QHS   Continuous Infusions: . sodium chloride       LOS: 2 days   Roxan Hockey, MD  04/09/2020, 12:19 PM

## 2020-04-09 NOTE — Discharge Instructions (Signed)
1) okay to restart Xarelto 15 MG after hemodialysis on Thursday April 10, 2020 2)Avoid ibuprofen/Advil/Aleve/Motrin/Goody Powders/Naproxen/BC powders/Meloxicam/Diclofenac/Indomethacin and other Nonsteroidal anti-inflammatory medications as these will make you more likely to bleed and can cause stomach ulcers, can also cause Kidney problems.  3) continue hemodialysis on Tuesdays Thursdays and Saturdays per your usual schedule 4) please follow-up with Dr. Laural Golden the  gastroenterologist for possible capsule endoscopy--- to further evaluate the possible reasons for your bleeding 5)Smoking cessation strongly advised-okay to use nicotine patch to help you quit smoking 6)Very low-salt diet advised 7)Weigh yourself daily, call if you gain more than 3 pounds in 1 day or more than 5 pounds in 1 week as your diuretic medications and your hemodialysis dry weight may need to be adjusted 8)Limit your Fluid  intake to no more than 50 ounces (1.5 Liters) per day 9)-call or return if black, dark, maroon, mahogany stools or bright red blood per rectum

## 2020-04-09 NOTE — Progress Notes (Signed)
Patient states he passed some dark old blood but did not has any fresh blood per rectum.  He has not experienced nausea vomiting abdominal pain chest pain or shortness of breath.

## 2020-04-09 NOTE — Progress Notes (Signed)
Admit: 04/07/2020 LOS: 2  85M ESRD with GIB  Subjective:  . EGD yesterday , no obvious bleeding source, for CSY today . HD yesterday, UF < 0.5L,   . No c/o this AM.  . 1 BM overnight with dark tarry features . Hb 9.8 this AM  06/29 0701 - 06/30 0700 In: 300 [I.V.:300] Out: 456   Filed Weights   04/07/20 1402 04/08/20 0717 04/08/20 1430  Weight: 83.2 kg 81.6 kg 81.5 kg    Scheduled Meds: . atorvastatin  40 mg Oral QHS  . carvedilol  12.5 mg Oral BID WC  . Chlorhexidine Gluconate Cloth  6 each Topical Daily  . insulin aspart  0-6 Units Subcutaneous TID WC  . levothyroxine  25 mcg Oral QAC breakfast  . nicotine  21 mg Transdermal Daily  . pantoprazole (PROTONIX) IV  40 mg Intravenous Q12H  . rOPINIRole  0.5 mg Oral QHS  . sodium chloride flush  3 mL Intravenous Q12H  . terazosin  1 mg Oral QHS   Continuous Infusions: . sodium chloride     PRN Meds:.sodium chloride, acetaminophen **OR** acetaminophen, HYDROcodone-acetaminophen, nitroGLYCERIN, ondansetron **OR** ondansetron (ZOFRAN) IV, sodium chloride flush    Physical Exam:  Blood pressure (!) 113/56, pulse 63, temperature 97.9 F (36.6 C), temperature source Oral, resp. rate 18, height 5\' 9"  (1.753 m), weight 81.5 kg, SpO2 97 %. GEN: NAD, ENT: NCAT EYES: EOMI CV: RRR nl s1s2 PULM: CTAB nl wob ABD: mild TTP in LUQ overlying inf margin of ribs, otherwise s/nt SKIN: R IJ TDC bandaged, LUE AVF with expected brusing EXT:no sig LEE NEURO: Nonfocal, intact VASC: LUE AVF +B/T  A 1. ESRD THS via [place], [place] 2. ABLA / GIB, GI following EGD 6/29, CSY For today.   3. AF on DOAC as outpt, held now, xarelto, would apixaban be better choice given #1? 4. 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 TDC preesnt, s/p ligation of sidebranch veins x3 6/18 of LUE AVF 6. Tobacco user 7. DM2 8. OSA on CPAP  P . HD tomorrow on schedule: 3.5h, 400/800, no heparin 3K to start . ESA with HD tomorrow: aranesp 60 weekly . Medication  Issues; o Preferred narcotic agents for pain control are hydromorphone, fentanyl, and methadone. Morphine should not be used.  o Baclofen should be avoided o Avoid oral sodium phosphate and magnesium citrate based laxatives / bowel preps    Pearson Grippe MD 04/09/2020, 6:29 AM  Recent Labs  Lab 04/07/20 0900 04/08/20 1519 04/09/20 0455  NA 134*  --  132*  K 4.2  --  3.1*  CL 92*  --  96*  CO2 28  --  27  GLUCOSE 161*  --  123*  BUN 73*  --  30*  CREATININE 5.52*  --  3.15*  CALCIUM 8.6*  --  8.2*  PHOS  --  4.2 4.0   Recent Labs  Lab 04/07/20 0900 04/08/20 0448 04/09/20 0455  WBC 8.4 7.6 5.5  HGB 11.4* 10.3* 9.8*  HCT 35.0* 31.4* 30.3*  MCV 92.8 91.8 92.4  PLT 155 154 137*

## 2020-04-09 NOTE — Discharge Summary (Signed)
Terry Mano., is a 85 y.o. male  DOB Aug 23, 1935  MRN [id number].  Admission date:  04/07/2020  Admitting Physician  Murlean Iba, MD  Discharge Date:  04/09/2020   Primary MD  [name], MD  Recommendations for primary care physician for things to follow:   1)Okay to restart Xarelto 15 MG after hemodialysis on Thursday April 10, 2020 2)Avoid ibuprofen/Advil/Aleve/Motrin/Goody Powders/Naproxen/BC powders/Meloxicam/Diclofenac/Indomethacin and other Nonsteroidal anti-inflammatory medications as these will make you more likely to bleed and can cause stomach ulcers, can also cause Kidney problems.  3) continue hemodialysis on Tuesdays Thursdays and Saturdays per your usual schedule 4) please follow-up with Dr. Laural Golden the  gastroenterologist for possible capsule endoscopy--- to further evaluate the possible reasons for your bleeding 5)Smoking cessation strongly advised-okay to use nicotine patch to help you quit smoking 6)Very low-salt diet advised 7)Weigh yourself daily, call if you gain more than 3 pounds in 1 day or more than 5 pounds in 1 week as your diuretic medications and your hemodialysis dry weight may need to be adjusted 8)Limit your Fluid  intake to no more than 50 ounces (1.5 Liters) per day 9)-call or return if black, dark, maroon, mahogany stools or bright red blood per rectum  Admission Diagnosis  GI bleeding [K92.2]   Discharge Diagnosis  GI bleeding [K92.2]    Principal Problem:   GI bleeding Active Problems:   Gastrointestinal hemorrhage with melena   Hypertension   Anemia due to stage 5 chronic kidney disease (HCC)   Hyperlipidemia   Type 2 diabetes mellitus with stage 5 chronic kidney disease (HCC)   Acute blood loss anemia   Heme positive stool   CHF (congestive heart failure) (HCC)   HOH (hard of hearing)   Hypothyroidism   Sleep apnea   ESRD on hemodialysis  ([place])   Hyperglycemia   History of Prepyloric ulcer      Past Medical History:  Diagnosis Date  . Anemia   . Arthritis   . Atrial fibrillation ([place])   . CHF (congestive heart failure) ([place])   . Chronic kidney disease    Stage 5  . Chronic kidney disease-mineral and bone disorder   . Headache   . HOH (hard of hearing)   . Hyperlipidemia   . Hypertension   . Hypothyroidism   . Peripheral vascular disease ([place])   . Proteinuria   . Sleep apnea    wears CPAP 12  . Type 2 diabetes mellitus with stage 5 chronic kidney disease ([place])   . Wears glasses     Past Surgical History:  Procedure Laterality Date  . A/V FISTULAGRAM N/A 03/14/2020   Procedure: A/V FISTULAGRAM - left arm;  Surgeon: Elam Dutch, MD;  Location: [facility];  Service: Cardiovascular;  Laterality: N/A;  . AV FISTULA PLACEMENT Left 01/01/2020   Procedure: LEFT ARM ARTERIOVENOUS (AV) FISTULA CREATION;  Surgeon: Angelia Mould, MD;  Location: [facility];  Service: Vascular;  Laterality: Left;  . CATARACT EXTRACTION W/ INTRAOCULAR LENS  IMPLANT, BILATERAL    . COLONOSCOPY    . ESOPHAGOGASTRODUODENOSCOPY N/A 04/25/2019   Procedure: ESOPHAGOGASTRODUODENOSCOPY (EGD);  Surgeon: [name], MD;  [facility];  Service: Endoscopy;  Laterality: N/A;  . HIP ARTHROSCOPY Left   . INSERTION OF DIALYSIS CATHETER Right 01/01/2020   Procedure: INSERTION OF RIGHT INTERNAL JUGULAR DIALYSIS CATHETER;  Surgeon: Angelia Mould, MD;  Location: [facility];  Service: Vascular;  Laterality: Right;  . LIGATION OF COMPETING BRANCHES OF ARTERIOVENOUS FISTULA Left 03/28/2020   Procedure: SIDE BRANCH LIGATION TIMES THREE OF LEFT ARM  ARTERIOVENOUS FISTULA;  Surgeon: Marty Heck, MD;  Location: [facility];  Service: Vascular;  Laterality: Left;     HPI  from the history and physical done on the day of admission:    Chief Complaint: rectal bleeding   HPI: Terry Graves. is a 85 y.o. male long time smoker  who lives with adult daughter with medical history significant for end-stage renal disease on hemodialysis Tuesday Thursday Saturday. He was hospitalized 2 weeks ago for mechanical complication associated with his left arm AV fistula and was treated by Dr. Monica Martinez and Dr. Oneida Alar. He has chronic atrial fibrillation and he is fully anticoagulated with Xarelto. He had an episode of GI bleeding in 2020 where he was seen by Dr. Laural Golden and upper endoscopy revealed a prepyloric ulcer. He reports he has not had any problems with GI bleeding since then. He reports that about 5 days ago he began to see blood in the stool. This started off a small amounts but has increased to very large amounts of blood in the stool and toilet. He showed his daughter who brought him to the emergency department for further treatment. He says that he stopped taking Xarelto yesterday due to his worry about the ongoing rectal bleeding. He continues to smoke cigarettes daily. He tolerated hemodialysis on Saturday with no difficulties.  ED Course: He arrived afebrile with normal vital signs. His pulse ox was 98% on room air. Sodium was 134, glucose 161, creatinine 5.52, albumin 3.1, hemoglobin 11.4, hematocrit 35.0. His hemoglobin on 618 was 13.9 and his hematocrit was 41.0 this during his recent hospitalization. His PT was 15.2 and INR 1.2. He was Hemoccult positive. His SARS 2 coronavirus test was negative. His EKG showed atrial fibrillation with a controlled rate. He is being admitted for further evaluation and management.  Review of Systems: As per HPI otherwise 10 point review of systems negative.      Hospital Course:   Brief Admission History:  85 y.o.malelong time smoker who lives with adult daughterwith medical history significantfor end-stage renal disease on hemodialysis Tuesday Thursday Saturday. He was hospitalized 2 weeks ago for mechanical complication associated with his left arm AV fistula and was treated  by Dr. Monica Martinez and Dr. Oneida Alar. He has chronic atrial fibrillation and he is fully anticoagulated with Xarelto. He had an episode of GI bleeding in 2020 where he was seen by Dr. Laural Golden and upper endoscopy revealed a prepyloric ulcer.  He was admitted with GI bleeding and acute blood loss anemia.    Assessment & Plan:   Principal Problem:   GI bleeding Active Problems:   Gastrointestinal hemorrhage with melena   Hypertension   Anemia due to stage 5 chronic kidney disease (HCC)   Hyperlipidemia   Type 2 diabetes mellitus with stage 5 chronic kidney disease (HCC)   Acute blood loss anemia   Heme positive stool   CHF (congestive  heart failure) (HCC)   HOH (hard of hearing)   Hypothyroidism   Sleep apnea   ESRD on hemodialysis ([place])   Hyperglycemia   History of Prepyloric ulcer    1)Acute GI bleed - EGD on 04/08/20 did not show a source for the bleeding.   -- colonoscopy 04/09/2020 with  Diverticulosis in the sigmoid colon,  - External hemorrhoids -treated with Protonix -Plan is for outpatient capsule endoscopy  2)Acute on chronic anemia secondary to ABLA--patient has baseline anemia of ESRD, however recent baseline hemoglobin usually above 13, -On admission hemoglobin was 11.4 on 04/07/2020, hemoglobin down to 9.8, -Patient receives ESA agent with HD sessions weekly, -Endoluminal evaluation as above #1 -restart  Xarelto on 04/10/2020  3)ESRD on HD - Dr. Joelyn Oms consulted , tolerated HD well on 04/08/2020 Next HD session with ESA and without heparin is planned for 04/10/2020  -- 4)OSA - c/n  nightly CPAP.   5)DM2-  Last A1c 7.3, reflecting uncontrolled DM PTA,  Resume  Home rx  6)Hypothyroidism - c/n  levothyroxine.    7)HFrEF--history of chronic systolic dysfunction CHF, last known EF 35 to 40% based on echo from July 2020 -Appears stable/compensated at this time -Continue to use hemodialysis to address volume status -Continue Coreg 12.5 mg twice  daily  8)HLD-stable continue Lipitor 40 mg every afternoon  9)BPH--stable, continue Hytrin 1 mg nightly  Code Status: full  Family Communication: daughter  Disposition: Home   Consultants:   GI  Procedures:   EGD: 04/08/20 Dr. Laural Golden Brief EGD note. Normal mucosa of esophagus and GE junction. Mild diffuse portal gastropathy but no evidence of ulcers erosions or other abnormalities. Normal bulbar and post bulbar mucosa.   Discharge Condition: Stable  Follow UP   Follow-up Information    Rehman, Mechele Dawley, MD. Schedule an appointment as soon as possible for a visit in 1 week(s).   Specialty: Gastroenterology Contact information: [address], [address] [place] [address] [phone]               Diet and Activity recommendation:  As advised  Discharge Instructions    Discharge Instructions    Call MD for:  difficulty breathing, headache or visual disturbances   Complete by: As directed    Call MD for:  extreme fatigue   Complete by: As directed    Call MD for:  persistant dizziness or light-headedness   Complete by: As directed    Call MD for:  persistant nausea and vomiting   Complete by: As directed    Call MD for:  severe uncontrolled pain   Complete by: As directed    Call MD for:  temperature >100.4   Complete by: As directed    Diet - low sodium heart healthy   Complete by: As directed    Discharge instructions   Complete by: As directed    1) okay to restart Xarelto 15 MG after hemodialysis on Thursday April 10, 2020 2)Avoid ibuprofen/Advil/Aleve/Motrin/Goody Powders/Naproxen/BC powders/Meloxicam/Diclofenac/Indomethacin and other Nonsteroidal anti-inflammatory medications as these will make you more likely to bleed and can cause stomach ulcers, can also cause Kidney problems.  3) continue hemodialysis on Tuesdays Thursdays and Saturdays per your usual schedule 4) please follow-up with Dr. Laural Golden the  gastroenterologist for possible capsule  endoscopy--- to further evaluate the possible reasons for your bleeding 5)Smoking cessation strongly advised-okay to use nicotine patch to help you quit smoking 6)Very low-salt diet advised 7)Weigh yourself daily, call if you gain more than 3 pounds  in 1 day or more than 5 pounds in 1 week as your diuretic medications and your hemodialysis dry weight may need to be adjusted 8)Limit your Fluid  intake to no more than 50 ounces (1.5 Liters) per day 9)-call or return if black, dark, maroon, mahogany stools or bright red blood per rectum   Increase activity slowly   Complete by: As directed    No dressing needed   Complete by: As directed         Discharge Medications     Allergies as of 04/09/2020      Reactions   Penicillins Rash, Other (See Comments)   Did it involve swelling of the face/tongue/throat, SOB, or low BP? No Did it involve sudden or severe rash/hives, skin peeling, or any reaction on the inside of your mouth or nose? Yes Did you need to seek medical attention at a hospital or doctor's office? Unknown When did it last happen?60 years If all above answers are "NO", may proceed with cephalosporin use.   Quinine    UNSPECIFIED REACTION       Medication List    TAKE these medications   acetaminophen 650 MG CR tablet Commonly known as: TYLENOL Take 1,300 mg by mouth in the morning and at bedtime.   atorvastatin 40 MG tablet Commonly known as: LIPITOR Take 40 mg by mouth at bedtime.   carvedilol 12.5 MG tablet Commonly known as: COREG Take 12.5 mg by mouth 2 (two) times daily.   DAILY VITE PO Take 800 mg by mouth daily. With Zinc   docusate sodium 100 MG capsule Commonly known as: COLACE Take 100 mg by mouth daily as needed for mild constipation.   HYDROcodone-acetaminophen 5-325 MG tablet Commonly known as: NORCO/VICODIN Take 1 tablet by mouth every 4 (four) hours as needed for moderate pain.   levothyroxine 25 MCG tablet Commonly known as:  SYNTHROID Take 25 mcg by mouth daily before breakfast.   MIRCERA IJ Inject as directed as needed (Low hemoglobin).   nicotine 14 mg/24hr patch Commonly known as: NICODERM CQ - dosed in mg/24 hours Place 1 patch (14 mg total) onto the skin daily. Start taking on: April 10, 2020   nitroGLYCERIN 0.4 MG SL tablet Commonly known as: NITROSTAT Place 0.4 mg under the tongue every 5 (five) minutes as needed for chest pain.   ondansetron 4 MG tablet Commonly known as: ZOFRAN Take 1 tablet (4 mg total) by mouth every 6 (six) hours as needed for nausea.   pantoprazole 40 MG tablet Commonly known as: PROTONIX Take 40 mg by mouth at bedtime.   rOPINIRole 0.5 MG tablet Commonly known as: REQUIP Take 0.5 mg by mouth at bedtime.   terazosin 1 MG capsule Commonly known as: HYTRIN Take 1 mg by mouth at bedtime.   torsemide 20 MG tablet Commonly known as: DEMADEX Take 40 mg by mouth 2 (two) times daily.   Xarelto 15 MG Tabs tablet Generic drug: Rivaroxaban Take 1 tablet (15 mg total) by mouth daily with supper. Start taking on: April 10, 2020            Discharge Care Instructions  (From admission, onward)         Start     Ordered   04/09/20 0000  No dressing needed        04/09/20 1804          Major procedures and Radiology Reports - PLEASE review detailed and final reports for all details, in brief -  PERIPHERAL VASCULAR CATHETERIZATION  Result Date: 03/14/2020 Procedure: Left arm fistulogram, ultrasound left arm AV fistula Preoperative diagnosis: Poorly functioning AV fistula left arm Postoperative diagnosis: Same Anesthesia: Local Operative findings: Widely patent AV fistula left upper arm and central venous structures Multiple side branches proximal third of AV fistula Operative details: After the informed consent, patient taken the PV lab.  The patient placed in supine position angio table.  Patient's left upper extremities prepped and draped in usual sterile fashion.   Local anesthesia was infiltrated near the antecubital crease.  Ultrasound was used to identify the patient's left arm AV fistula.  Local anesthesia was infiltrated over this.  Micropuncture needle was used to cannulate the AV fistula micropuncture wire advanced into the fistula.  Micropuncture sheath was then placed over this.  Contrast angiogram was then obtained to the micropuncture sheath.  The superior vena cava and innominate left subclavian veins are all widely patent.  Left upper arm AV fistula is widely patent.  Blood pressure cuff was inflated in the upper arm to reflux contrast across the arterial anastomosis.  This was also widely patent.  There were multiple side branches in the proximal one third of the fistula.  Some of these were as large as 2-1/2 to 3 mm.  These may be diverging some flow of the fistula.  There was no narrowing of the fistula noted. At this point the procedure was concluded a figure-of-eight stitch was placed at the micropuncture sheath exit site and the micropuncture sheath removed and hemostasis obtained with direct pressure.  Patient tired procedure well and there were no complications.  Patient was taken the holding area in stable condition. Operative management: The patient will be scheduled in the near future for sidebranch ligation of his left arm AV fistula to assist with maturity of this. Ruta Hinds, MD [facility] Office: [phone]   Micro Results    Recent Results (from the past 240 hour(s))  SARS Coronavirus 2 by RT PCR (hospital order, performed in [facility] hospital lab) Nasopharyngeal Nasopharyngeal Swab     Status: None   Collection Time: 04/07/20 11:23 AM   Specimen: Nasopharyngeal Swab  Result Value Ref Range Status   SARS Coronavirus 2 NEGATIVE NEGATIVE Final    Comment: (NOTE) SARS-CoV-2 target nucleic acids are NOT DETECTED.  The SARS-CoV-2 RNA is generally detectable in upper and lower respiratory  specimens during the acute phase of infection. The lowest concentration of SARS-CoV-2 viral copies this assay can detect is 250 copies / mL. A negative result does not preclude SARS-CoV-2 infection and should not be used as the sole basis for treatment or other patient management decisions.  A negative result may occur with improper specimen collection / handling, submission of specimen other than nasopharyngeal swab, presence of viral mutation(s) within the areas targeted by this assay, and inadequate number of viral copies (<250 copies / mL). A negative result must be combined with clinical observations, patient history, and epidemiological information.  Fact Sheet for Patients:   StrictlyIdeas.no  Fact Sheet for Healthcare Providers: BankingDealers.co.za  This test is not yet approved or  cleared by the [place] FDA and has been authorized for detection and/or diagnosis of SARS-CoV-2 by FDA under an Emergency Use Authorization (EUA).  This EUA will remain in effect (meaning this test can be used) for the duration of the COVID-19 declaration under Section 564(b)(1) of the Act, 21 U.S.C. section 360bbb-3(b)(1), unless the authorization is terminated or revoked sooner.  Performed  at [facility], [address] [place], [place] [address]        Today   Subjective    Shiquan Mathieu today has no new complaints -Tolerating oral intake well after uneventful colonoscopy -Family members (daughters x 2)  at bedside patient eager to go home          Patient has been seen and examined prior to discharge   Objective   Blood pressure 120/69, pulse 75, temperature 97.7 F (36.5 C), temperature source Oral, resp. rate 18, height 5\' 9"  (1.753 m), weight 81.6 kg, SpO2 100 %.   Intake/Output Summary (Last 24 hours) at 04/09/2020 1805 Last data filed at 04/09/2020 1434 Gross per 24 hour  Intake 660 ml  Output 456 ml  Net 204 ml     Exam General exam: chronically ill appearing male, in no acute distress respiratory system: Clear to auscultation. Respiratory effort normal. NECK-right HD catheter Cardiovascular system: S1 & S2 heard, RRR. No JVD, murmurs, rubs, gallops or clicks. No pedal edema. Gastrointestinal system: Abdomen is nondistended, soft and nontender.Normal bowel sounds heard. Central nervous system: Alert and oriented. No focal neurological deficits. Extremities: left AVF in place. Skin: No rashes, lesions or ulcers Psychiatry: Judgement and insight appear normal. Mood & affect appropriate.   Data Review   CBC w Diff:  Lab Results  Component Value Date   WBC 5.5 04/09/2020   HGB 9.8 (L) 04/09/2020   HCT 30.3 (L) 04/09/2020   PLT 137 (L) 04/09/2020   LYMPHOPCT 19 04/25/2019   MONOPCT 7 04/25/2019   EOSPCT 0 04/25/2019   BASOPCT 0 04/25/2019    CMP:  Lab Results  Component Value Date   NA 132 (L) 04/09/2020   K 3.1 (L) 04/09/2020   CL 96 (L) 04/09/2020   CO2 27 04/09/2020   BUN 30 (H) 04/09/2020   CREATININE 3.15 (H) 04/09/2020   PROT 6.9 04/07/2020   ALBUMIN 2.5 (L) 04/09/2020   BILITOT 0.7 04/07/2020   ALKPHOS 90 04/07/2020   AST 24 04/07/2020   ALT 21 04/07/2020  .   Total Discharge time is about 33 minutes  Roxan Hockey M.D on 04/09/2020 at 6:05 PM  Go to [email or web address] -  for contact info  [facility] - Office  [phone]

## 2020-04-09 NOTE — Progress Notes (Signed)
Brief colonoscopy note.  Prep excellent. Normal terminal ileum. Diverticuli at sigmoid colon otherwise normal colonoscopy. Small external hemorrhoids.

## 2020-04-09 NOTE — Op Note (Signed)
[facility] Patient Name: Terry Graves Procedure Date: 04/09/2020 1:44 PM MRN: [id number] Date of Birth: 06/29/1935 Attending MD: Hildred Laser , MD CSN: [id number] Age: 85 Admit Type: Outpatient Procedure:                Colonoscopy Indications:              Gastrointestinal bleeding Providers:                Hildred Laser, MD, Crystal Page, Raphael Gibney,                            Technician Referring MD:             Irwin Brakeman, Md Medicines:                Fentanyl 25 micrograms IV, Midazolam 2 mg IV Complications:            No immediate complications. Estimated Blood Loss:     Estimated blood loss: none. Procedure:                Pre-Anesthesia Assessment:                           - Prior to the procedure, a History and Physical                            was performed, and patient medications and                            allergies were reviewed. The patient's tolerance of                            previous anesthesia was also reviewed. The risks                            and benefits of the procedure and the sedation                            options and risks were discussed with the patient.                            All questions were answered, and informed consent                            was obtained. Prior Anticoagulants: The patient                            last took Xarelto (rivaroxaban) 5 days prior to the                            procedure. ASA Grade Assessment: III - A patient                            with severe systemic disease. After reviewing the  risks and benefits, the patient was deemed in                            satisfactory condition to undergo the procedure.                           After obtaining informed consent, the colonoscope                            was passed under direct vision. Throughout the                            procedure, the patient's blood pressure, pulse, and                             oxygen saturations were monitored continuously. The                            [id number]-[id number] ([id number]) scope was introduced through                            the anus and advanced to the the terminal ileum,                            with identification of the appendiceal orifice and                            IC valve. The colonoscopy was performed without                            difficulty. The patient tolerated the procedure                            well. The quality of the bowel preparation was                            excellent. The terminal ileum, ileocecal valve,                            appendiceal orifice, and rectum were photographed. Scope In: 2:19:59 PM Scope Out: 2:30:19 PM Scope Withdrawal Time: 0 hours 6 minutes 34 seconds  Total Procedure Duration: 0 hours 10 minutes 20 seconds  Findings:      The perianal and digital rectal examinations were normal.      The terminal ileum appeared normal.      Scattered diverticula were found in the sigmoid colon.      No other significant abnormalities were identified in a careful       examination of the remainder of the colon.      External hemorrhoids were found during retroflexion. The hemorrhoids       were small. Impression:               - The examined portion of the ileum was normal.                           -  Diverticulosis in the sigmoid colon.                           - External hemorrhoids.                           - No specimens collected. Moderate Sedation:      Moderate (conscious) sedation was administered by the endoscopy nurse       and supervised by the endoscopist. The following parameters were       monitored: oxygen saturation, heart rate, blood pressure, CO2       capnography and response to care. Total physician intraservice time was       13 minutes. Recommendation:           - Return patient to hospital ward for ongoing care.                           - Diabetic (ADA) diet today.                            - Continue present medications.                           - Resume Xarelto (rivaroxaban) at prior dose                            tomorrow.                           - To visualize the small bowel, perform video                            capsule endoscopy as outpatient. Procedure Code(s):        --- Professional ---                           [phone], Colonoscopy, flexible; diagnostic, including                            collection of specimen(s) by brushing or washing,                            when performed (separate procedure)                           G0500, Moderate sedation services provided by the                            same physician or other qualified health care                            professional performing a gastrointestinal                            endoscopic service that sedation supports,  requiring the presence of an independent trained                            observer to assist in the monitoring of the                            patient's level of consciousness and physiological                            status; initial 15 minutes of intra-service time;                            patient age 47 years or older (additional time may                            be reported with [phone], as appropriate) Diagnosis Code(s):        --- Professional ---                           K64.4, Residual hemorrhoidal skin tags                           K92.2, Gastrointestinal hemorrhage, unspecified                           K57.30, Diverticulosis of large intestine without                            perforation or abscess without bleeding CPT copyright 2019 American Medical Association. All rights reserved. The codes documented in this report are preliminary and upon coder review may  be revised to meet current compliance requirements. Hildred Laser, MD Hildred Laser, MD 04/09/2020 2:41:32 PM This report has been signed  electronically. Number of Addenda: 0

## 2020-04-10 ENCOUNTER — Encounter (HOSPITAL_COMMUNITY): Payer: Self-pay | Admitting: Internal Medicine

## 2020-04-16 ENCOUNTER — Ambulatory Visit (HOSPITAL_COMMUNITY)
Admission: RE | Admit: 2020-04-16 | Discharge: 2020-04-16 | Disposition: A | Discharge status: Home / Self Care | Payer: Medicare Other | Attending: Internal Medicine | Admitting: Internal Medicine

## 2020-04-16 ENCOUNTER — Encounter (HOSPITAL_COMMUNITY)
Admission: RE | Disposition: A | Discharge status: Home / Self Care | Payer: Self-pay | Source: Home / Self Care | Attending: Internal Medicine

## 2020-04-16 DIAGNOSIS — E1122 Type 2 diabetes mellitus with diabetic chronic kidney disease: Secondary | ICD-10-CM | POA: Diagnosis not present

## 2020-04-16 DIAGNOSIS — E039 Hypothyroidism, unspecified: Secondary | ICD-10-CM | POA: Diagnosis not present

## 2020-04-16 DIAGNOSIS — M1990 Unspecified osteoarthritis, unspecified site: Secondary | ICD-10-CM | POA: Insufficient documentation

## 2020-04-16 DIAGNOSIS — I4891 Unspecified atrial fibrillation: Secondary | ICD-10-CM | POA: Insufficient documentation

## 2020-04-16 DIAGNOSIS — Z7901 Long term (current) use of anticoagulants: Secondary | ICD-10-CM | POA: Insufficient documentation

## 2020-04-16 DIAGNOSIS — F17290 Nicotine dependence, other tobacco product, uncomplicated: Secondary | ICD-10-CM | POA: Insufficient documentation

## 2020-04-16 DIAGNOSIS — Z992 Dependence on renal dialysis: Secondary | ICD-10-CM | POA: Diagnosis not present

## 2020-04-16 DIAGNOSIS — I132 Hypertensive heart and chronic kidney disease with heart failure and with stage 5 chronic kidney disease, or end stage renal disease: Secondary | ICD-10-CM | POA: Diagnosis not present

## 2020-04-16 DIAGNOSIS — N186 End stage renal disease: Secondary | ICD-10-CM | POA: Insufficient documentation

## 2020-04-16 DIAGNOSIS — G4730 Sleep apnea, unspecified: Secondary | ICD-10-CM | POA: Diagnosis not present

## 2020-04-16 DIAGNOSIS — I739 Peripheral vascular disease, unspecified: Secondary | ICD-10-CM | POA: Diagnosis not present

## 2020-04-16 DIAGNOSIS — D500 Iron deficiency anemia secondary to blood loss (chronic): Secondary | ICD-10-CM | POA: Diagnosis present

## 2020-04-16 DIAGNOSIS — E785 Hyperlipidemia, unspecified: Secondary | ICD-10-CM | POA: Diagnosis not present

## 2020-04-16 DIAGNOSIS — I509 Heart failure, unspecified: Secondary | ICD-10-CM | POA: Insufficient documentation

## 2020-04-16 HISTORY — PX: GIVENS CAPSULE STUDY: SHX5432

## 2020-04-16 SURGERY — IMAGING PROCEDURE, GI TRACT, INTRALUMINAL, VIA CAPSULE

## 2020-04-16 NOTE — H&P (Signed)
Terry Graves. is an 85 y.o. male.   Chief Complaint: Patient is here for small bowel given capsule study. HPI: Patient is 85 year old Caucasian male who was hospitalized about 2 weeks ago for GI bleed starting with melena and then stool turning dark red.  He is chronically anticoagulated.  His hemoglobin dropped by about 4 g but he did not require blood transfusion.  He underwent EGD and colonoscopy and no bleeding lesion was identified.  Therefore further evaluation with small bowel given capsule study was recommended.  Patient is back on his Xarelto and has not had any more bleeding episodes.  Past Medical History:  Diagnosis Date  . Anemia   . Arthritis   . Atrial fibrillation ([place])   . CHF (congestive heart failure) ([place])   . Chronic kidney disease    Stage 5  . Chronic kidney disease-mineral and bone disorder   . Headache   . HOH (hard of hearing)   . Hyperlipidemia   . Hypertension   . Hypothyroidism   . Peripheral vascular disease ([place])   . Proteinuria   . Sleep apnea    wears CPAP 12  . Type 2 diabetes mellitus with stage 5 chronic kidney disease ([place])   . Wears glasses     Past Surgical History:  Procedure Laterality Date  . A/V FISTULAGRAM N/A 03/14/2020   Procedure: A/V FISTULAGRAM - left arm;  Surgeon: Elam Dutch, MD;  Location: [place] [facility];  Service: Cardiovascular;  Laterality: N/A;  . AV FISTULA PLACEMENT Left 01/01/2020   Procedure: LEFT ARM ARTERIOVENOUS (AV) FISTULA CREATION;  Surgeon: Angelia Mould, MD;  Location: [facility];  Service: Vascular;  Laterality: Left;  . CATARACT EXTRACTION W/ INTRAOCULAR LENS  IMPLANT, BILATERAL    . COLONOSCOPY    . COLONOSCOPY N/A 04/09/2020   Procedure: COLONOSCOPY;  Surgeon: [name], MD;  [facility];  Service: Endoscopy;  Laterality: N/A;  . ESOPHAGOGASTRODUODENOSCOPY N/A 04/25/2019   Procedure: ESOPHAGOGASTRODUODENOSCOPY (EGD);  Surgeon: [name], MD;  [facility];   Service: Endoscopy;  Laterality: N/A;  . ESOPHAGOGASTRODUODENOSCOPY N/A 04/08/2020   Procedure: ESOPHAGOGASTRODUODENOSCOPY (EGD);  Surgeon: [name], MD;  [facility];  Service: Endoscopy;  Laterality: N/A;  . HIP ARTHROSCOPY Left   . INSERTION OF DIALYSIS CATHETER Right 01/01/2020   Procedure: INSERTION OF RIGHT INTERNAL JUGULAR DIALYSIS CATHETER;  Surgeon: Angelia Mould, MD;  Location: [place];  Service: Vascular;  Laterality: Right;  . LIGATION OF COMPETING BRANCHES OF ARTERIOVENOUS FISTULA Left 03/28/2020   Procedure: SIDE BRANCH LIGATION TIMES THREE OF LEFT ARM  ARTERIOVENOUS FISTULA;  Surgeon: Marty Heck, MD;  Location: [facility];  Service: Vascular;  Laterality: Left;    Family History  Problem Relation Age of Onset  . Lung cancer Father    Social History:  reports that he has been smoking cigars. He has been smoking about 0.25 packs per day. He has never used smokeless tobacco. He reports current alcohol use. He reports that he does not use drugs.  Allergies:  Allergies  Allergen Reactions  . Penicillins Rash and Other (See Comments)    Did it involve swelling of the face/tongue/throat, SOB, or low BP? No Did it involve sudden or severe rash/hives, skin peeling, or any reaction on the inside of your mouth or nose? Yes Did you need to seek medical attention at a hospital or doctor's office? Unknown When did it last happen?60 years If all  above answers are "NO", may proceed with cephalosporin use.  . Quinine     UNSPECIFIED REACTION     No medications prior to admission.    No results found for this or any previous visit (from the past 48 hour(s)). No results found.  Review of Systems  Height 5\' 9"  (1.753 m). Physical Exam   Assessment/Plan Anemia secondary to GI bleed from an unknown source. No bleeding lesion identified on EGD and colonoscopy during recent hospitalization. Small bowel given capsule study.   Hildred Laser,  MD 04/16/2020, 10:23 AM

## 2020-04-21 ENCOUNTER — Other Ambulatory Visit: Payer: Self-pay | Admitting: *Deleted

## 2020-04-21 DIAGNOSIS — N186 End stage renal disease: Secondary | ICD-10-CM

## 2020-04-22 ENCOUNTER — Encounter (HOSPITAL_COMMUNITY): Payer: Self-pay | Admitting: Internal Medicine

## 2020-04-22 NOTE — Op Note (Signed)
Small Bowel [place]ens Capsule Study Procedure date: 04/16/2020  Referring Provider:  Roxan Hockey, MD PCP:  Dr. Mallie Mussel, Laymond Purser, MD  Indication for procedure:    Patient is 85 year old Caucasian male with multiple medical problems including end-stage renal disease on hemodialysis who was hospitalized last month for melena and anemia.  He has anticoagulated for atrial fibrillation.  He underwent esophagogastroduodenoscopy as well as colonoscopy and no bleeding lesion was identified.  Therefore small bowel given capsule study was recommended in order to determine the source of GI blood loss.   Findings:    Patient was able to swallow given capsule without any difficulty. Study duration 8 hours and 19 minutes. Study is complete as given capsule reached cecum. Small duodenal diverticulum best seen on image at 001:1:44. Mucosa of small bowel was otherwise unremarkable.  First Gastric image: 42 sec First Duodenal image: 6 min and 39 sec First Ileo-Cecal Valve image: 5 hrs 21 min and 39 sec First Cecal image: 5 hrs 21 min and 45 sec Gastric Passage time: 5 min and 57 sec Small Bowel Passage time: 5 hrs and 15 min  Summary & Recommendations:  No mucosal abnormalities such as erosions ulcers or AV malformations noted in small bowel. Single small duodenal diverticulum without stigmata of bleed.  Please note study results were reviewed with patient's daughter Ms. [name]. If patient has evidence of gross bleeding i.e. melena or rectal bleeding patient will need to report to the emergency room for further evaluation starting with GI bleeding scan. Will monitor H&H over the next few weeks.

## 2020-04-24 ENCOUNTER — Telehealth (INDEPENDENT_AMBULATORY_CARE_PROVIDER_SITE_OTHER): Payer: Self-pay | Admitting: *Deleted

## 2020-04-24 NOTE — Telephone Encounter (Signed)
[facility] was called and ask to draw a H&H on patient.  She states that they will be doing a big lab drawn today to include a CBC,ect.  It may be the beginning of next week , they will fax [place] the lab results.

## 2020-04-28 ENCOUNTER — Other Ambulatory Visit: Payer: Self-pay

## 2020-04-28 ENCOUNTER — Ambulatory Visit (INDEPENDENT_AMBULATORY_CARE_PROVIDER_SITE_OTHER): Payer: Self-pay | Admitting: Physician Assistant

## 2020-04-28 ENCOUNTER — Ambulatory Visit (HOSPITAL_COMMUNITY)
Admission: RE | Admit: 2020-04-28 | Discharge: 2020-04-28 | Disposition: A | Discharge status: Home / Self Care | Payer: Medicare Other | Source: Ambulatory Visit | Attending: Surgery | Admitting: Surgery

## 2020-04-28 VITALS — BP 102/54 | HR 76 | Temp 97.7°F | Resp 20 | Ht 69.0 in | Wt 182.9 lb

## 2020-04-28 DIAGNOSIS — N186 End stage renal disease: Secondary | ICD-10-CM | POA: Diagnosis present

## 2020-04-28 NOTE — Progress Notes (Signed)
POST OPERATIVE DIALYSIS ACCESS OFFICE NOTE    CC:  F/u for dialysis access surgery; recent fistulogram  HPI:  This is a 85 y.o. male who is s/p left brachiocephalic fistula created by Dr. Scot Dock on 01/01/2020. He had pulsatile quality to fistula at last OV and underwent fistulogram on 03/14/2020. Fistulogram findings revealed a widely patent AV fistula left upper arm as well central venous structures.  There were multiple side branches of the proximal third of the AV fistula. These were felt to be diverging flow through the fistula. He was scheduled for sidebranch ligation and this was performed by Dr. Carlis Abbott on 03/28/2020  He denies signs or symptoms of steal syndrome left hand.  He is dialyzing via right IJ TDC placed at the time of his fistula creation.  He is on Xarelto.    Dialysis days:  TTS   Dialysis center:  [facility]  Allergies  Allergen Reactions  . Penicillins Rash and Other (See Comments)    Did it involve swelling of the face/tongue/throat, SOB, or low BP? No Did it involve sudden or severe rash/hives, skin peeling, or any reaction on the inside of your mouth or nose? Yes Did you need to seek medical attention at a hospital or doctor's office? Unknown When did it last happen?60 years If all above answers are "NO", may proceed with cephalosporin use.  . Quinine     UNSPECIFIED REACTION     Current Outpatient Medications  Medication Sig Dispense Refill  . acetaminophen (TYLENOL) 650 MG CR tablet Take 1,300 mg by mouth in the morning and at bedtime.    Marland Kitchen atorvastatin (LIPITOR) 40 MG tablet Take 40 mg by mouth at bedtime.     . carvedilol (COREG) 12.5 MG tablet Take 12.5 mg by mouth 2 (two) times daily.    Marland Kitchen docusate sodium (COLACE) 100 MG capsule Take 100 mg by mouth daily as needed for mild constipation.    Marland Kitchen HYDROcodone-acetaminophen (NORCO/VICODIN) 5-325 MG tablet Take 1 tablet by mouth every 4 (four) hours as needed for moderate pain. 6 tablet  0  . levothyroxine (SYNTHROID) 25 MCG tablet Take 25 mcg by mouth daily before breakfast.    . Methoxy PEG-Epoetin Beta (MIRCERA IJ) Inject as directed as needed (Low hemoglobin).    . Multiple Vitamin (DAILY VITE PO) Take 800 mg by mouth daily. With Zinc    . nicotine (NICODERM CQ - DOSED IN MG/24 HOURS) 14 mg/24hr patch Place 1 patch (14 mg total) onto the skin daily. 30 patch 1  . nitroGLYCERIN (NITROSTAT) 0.4 MG SL tablet Place 0.4 mg under the tongue every 5 (five) minutes as needed for chest pain.    Marland Kitchen ondansetron (ZOFRAN) 4 MG tablet Take 1 tablet (4 mg total) by mouth every 6 (six) hours as needed for nausea. 20 tablet 0  . pantoprazole (PROTONIX) 40 MG tablet Take 40 mg by mouth at bedtime.     Marland Kitchen rOPINIRole (REQUIP) 0.5 MG tablet Take 0.5 mg by mouth at bedtime.    Marland Kitchen terazosin (HYTRIN) 1 MG capsule Take 1 mg by mouth at bedtime.    . torsemide (DEMADEX) 20 MG tablet Take 40 mg by mouth 2 (two) times daily.    Alveda Reasons 15 MG TABS tablet Take 1 tablet (15 mg total) by mouth daily with supper. 42 tablet 0   Current Facility-Administered Medications  Medication Dose Route Frequency Provider Last Rate Last Admin  . 0.9 %  sodium chloride infusion  250 mL  Intravenous PRN Angelia Mould, MD      . sodium chloride flush (NS) 0.9 % injection 3 mL  3 mL Intravenous Q12H Angelia Mould, MD      . sodium chloride flush (NS) 0.9 % injection 3 mL  3 mL Intravenous PRN Angelia Mould, MD         ROS:  See HPI  Vitals:   04/28/20 0856  BP: (!) 102/54  Pulse: 76  Resp: 20  Temp: 97.7 F (36.5 C)  SpO2: 97%    Physical Exam:  General appearance: In NAD Cardiac:RRR Respiratory:nonlabored Incision:  healed Extremities:  Motor function and sensation intact. Hand warm. 5/5 grip strength. Good thrill in fistula; still somewhat pulsatile  Assessment/Plan:   -pt does not have evidence of steal syndrome -dialysis duplex done 5/28 reveals fistula to be of adequate  diameter and depth for access -the fistula may be used starting immediately. Please inform us of timing to remove [place], PA-C 04/28/2020 8:44 AM [facility] [phone]  Clinic MD:  Carlis Abbott

## 2020-04-29 ENCOUNTER — Encounter: Payer: Medicare Other | Admitting: Vascular Surgery

## 2020-04-29 ENCOUNTER — Encounter (HOSPITAL_COMMUNITY): Payer: Medicare Other

## 2020-05-14 ENCOUNTER — Other Ambulatory Visit: Payer: Self-pay

## 2020-05-14 DIAGNOSIS — S91109A Unspecified open wound of unspecified toe(s) without damage to nail, initial encounter: Secondary | ICD-10-CM

## 2020-05-15 ENCOUNTER — Other Ambulatory Visit: Payer: Self-pay

## 2020-05-15 ENCOUNTER — Ambulatory Visit (INDEPENDENT_AMBULATORY_CARE_PROVIDER_SITE_OTHER)
Admission: RE | Admit: 2020-05-15 | Discharge: 2020-05-15 | Disposition: A | Discharge status: Home / Self Care | Payer: Medicare Other | Source: Ambulatory Visit | Attending: Surgery | Admitting: Surgery

## 2020-05-15 ENCOUNTER — Ambulatory Visit (HOSPITAL_COMMUNITY)
Admission: RE | Admit: 2020-05-15 | Discharge: 2020-05-15 | Disposition: A | Discharge status: Home / Self Care | Payer: Medicare Other | Source: Ambulatory Visit | Attending: Surgery | Admitting: Surgery

## 2020-05-15 ENCOUNTER — Encounter: Payer: Self-pay | Admitting: Vascular Surgery

## 2020-05-15 ENCOUNTER — Ambulatory Visit (INDEPENDENT_AMBULATORY_CARE_PROVIDER_SITE_OTHER): Payer: Medicare Other | Admitting: Vascular Surgery

## 2020-05-15 VITALS — BP 101/56 | HR 64 | Temp 97.3°F | Resp 20 | Ht 69.0 in | Wt 182.0 lb

## 2020-05-15 DIAGNOSIS — S91109A Unspecified open wound of unspecified toe(s) without damage to nail, initial encounter: Secondary | ICD-10-CM

## 2020-05-15 DIAGNOSIS — I739 Peripheral vascular disease, unspecified: Secondary | ICD-10-CM

## 2020-05-15 NOTE — Progress Notes (Signed)
Referring Physician: Dr Theador Hawthorne  Patient name: Terry Graves. MRN: [id number] DOB: [birth year] Sex: male  REASON FOR CONSULT: Nonhealing right foot wound  HPI: Terry Graves. is a 85 y.o. male, he developed a wound on his right third toe several weeks ago and it has failed to heal.  He has developed redness in the right foot.  He does not walk much but does get around with a walker.  He has a long smoking history and does not really seem to be interested in quitting currently.  He has atrial fibrillation and is on Xarelto for this.  He is also on hemodialysis and dialyzes Tuesday Thursday Saturday.  He has not really had any prior arterial interventions that I know of however he states he may have had some type of vein procedure in the past.  Other medical problems include congestive failure, hyperlipidemia, hypertension, diabetes, sleep apnea which have been stable.  He currently has a right side dialysis catheter.  He states they are having some trouble with his fistula.  Past Medical History:  Diagnosis Date  . Anemia   . Arthritis   . Atrial fibrillation ([place])   . CHF (congestive heart failure) ([place])   . Chronic kidney disease    Stage 5  . Chronic kidney disease-mineral and bone disorder   . Headache   . HOH (hard of hearing)   . Hyperlipidemia   . Hypertension   . Hypothyroidism   . Peripheral vascular disease ([place])   . Proteinuria   . Sleep apnea    wears CPAP 12  . Type 2 diabetes mellitus with stage 5 chronic kidney disease ([place])   . Wears glasses    Past Surgical History:  Procedure Laterality Date  . A/V FISTULAGRAM N/A 03/14/2020   Procedure: A/V FISTULAGRAM - left arm;  Surgeon: Elam Dutch, MD;  Location: [facility];  Service: Cardiovascular;  Laterality: N/A;  . AV FISTULA PLACEMENT Left 01/01/2020   Procedure: LEFT ARM ARTERIOVENOUS (AV) FISTULA CREATION;  Surgeon: Angelia Mould, MD;  Location: [facility];  Service: Vascular;  Laterality:  Left;  . CATARACT EXTRACTION W/ INTRAOCULAR LENS  IMPLANT, BILATERAL    . COLONOSCOPY    . COLONOSCOPY N/A 04/09/2020   Procedure: COLONOSCOPY;  Surgeon: [name], MD;  [facility];  Service: Endoscopy;  Laterality: N/A;  . ESOPHAGOGASTRODUODENOSCOPY N/A 04/25/2019   Procedure: ESOPHAGOGASTRODUODENOSCOPY (EGD);  Surgeon: [name], MD;  [facility];  Service: Endoscopy;  Laterality: N/A;  . ESOPHAGOGASTRODUODENOSCOPY N/A 04/08/2020   Procedure: ESOPHAGOGASTRODUODENOSCOPY (EGD);  Surgeon: [name], MD;  [facility];  Service: Endoscopy;  Laterality: N/A;  . GIVENS CAPSULE STUDY N/A 04/16/2020   Procedure: GIVENS CAPSULE STUDY;  Surgeon: [name], MD;  [facility];  Service: Endoscopy;  Laterality: N/A;  730  . HIP ARTHROSCOPY Left   . INSERTION OF DIALYSIS CATHETER Right 01/01/2020   Procedure: INSERTION OF RIGHT INTERNAL JUGULAR DIALYSIS CATHETER;  Surgeon: Angelia Mould, MD;  Location: [facility];  Service: Vascular;  Laterality: Right;  . LIGATION OF COMPETING BRANCHES OF ARTERIOVENOUS FISTULA Left 03/28/2020   Procedure: SIDE BRANCH LIGATION TIMES THREE OF LEFT ARM  ARTERIOVENOUS FISTULA;  Surgeon: Marty Heck, MD;  Location: [facility];  Service: Vascular;  Laterality: Left;    Family History  Problem Relation Age of Onset  . Lung cancer Father     SOCIAL HISTORY:  Social History   Socioeconomic History  . Marital status: Widowed    Spouse name: Not on file  . Number of children: Not on file  . Years of education: Not on file  . Highest education level: Not on file  Occupational History  . Not on file  Tobacco Use  . Smoking status: Current Every Day Smoker    Packs/day: 0.25    Types: Cigars  . Smokeless tobacco: Never Used  Vaping Use  . Vaping Use: Never used  Substance and Sexual Activity  . Alcohol use: Yes    Comment: rarely  . Drug use: Never  . Sexual activity: Not on file  Other  Topics Concern  . Not on file  Social History Narrative  . Not on file   Social Determinants of Health   Financial Resource Strain:   . Difficulty of Paying Living Expenses:   Food Insecurity:   . Worried About Charity fundraiser in the Last Year:   . Arboriculturist in the Last Year:   Transportation Needs:   . Film/video editor (Medical):   Marland Kitchen Lack of Transportation (Non-Medical):   Physical Activity:   . Days of Exercise per Week:   . Minutes of Exercise per Session:   Stress:   . Feeling of Stress :   Social Connections:   . Frequency of Communication with Friends and Family:   . Frequency of Social Gatherings with Friends and Family:   . Attends Religious Services:   . Active Member of Clubs or Organizations:   . Attends Archivist Meetings:   Marland Kitchen Marital Status:   Intimate Partner Violence:   . Fear of Current or Ex-Partner:   . Emotionally Abused:   Marland Kitchen Physically Abused:   . Sexually Abused:     Allergies  Allergen Reactions  . Penicillins Rash and Other (See Comments)    Did it involve swelling of the face/tongue/throat, SOB, or low BP? No Did it involve sudden or severe rash/hives, skin peeling, or any reaction on the inside of your mouth or nose? Yes Did you need to seek medical attention at a hospital or doctor's office? Unknown When did it last happen?60 years If all above answers are "NO", may proceed with cephalosporin use.  . Quinine     UNSPECIFIED REACTION     Current Outpatient Medications  Medication Sig Dispense Refill  . acetaminophen (TYLENOL) 650 MG CR tablet Take 1,300 mg by mouth in the morning and at bedtime.    Marland Kitchen atorvastatin (LIPITOR) 40 MG tablet Take 40 mg by mouth at bedtime.     . carvedilol (COREG) 12.5 MG tablet Take 12.5 mg by mouth 2 (two) times daily.    Marland Kitchen docusate sodium (COLACE) 100 MG capsule Take 100 mg by mouth daily as needed for mild constipation.    Marland Kitchen HYDROcodone-acetaminophen (NORCO/VICODIN) 5-325 MG  tablet Take 1 tablet by mouth every 4 (four) hours as needed for moderate pain. 6 tablet 0  . levothyroxine (SYNTHROID) 25 MCG tablet Take 25 mcg by mouth daily before breakfast.    . Methoxy PEG-Epoetin Beta (MIRCERA IJ) Inject as directed as needed (Low hemoglobin).    . Multiple Vitamin (DAILY VITE PO) Take 800 mg by mouth daily. With Zinc    . nicotine (NICODERM CQ - DOSED IN MG/24 HOURS) 14 mg/24hr patch Place 1 patch (14 mg total) onto the skin daily. 30 patch 1  . nitroGLYCERIN (NITROSTAT) 0.4 MG SL tablet Place  0.4 mg under the tongue every 5 (five) minutes as needed for chest pain.    Marland Kitchen ondansetron (ZOFRAN) 4 MG tablet Take 1 tablet (4 mg total) by mouth every 6 (six) hours as needed for nausea. 20 tablet 0  . pantoprazole (PROTONIX) 40 MG tablet Take 40 mg by mouth at bedtime.     Marland Kitchen rOPINIRole (REQUIP) 0.5 MG tablet Take 0.5 mg by mouth at bedtime.    Marland Kitchen terazosin (HYTRIN) 1 MG capsule Take 1 mg by mouth at bedtime.    Alveda Reasons 15 MG TABS tablet Take 1 tablet (15 mg total) by mouth daily with supper. 42 tablet 0   Current Facility-Administered Medications  Medication Dose Route Frequency Provider Last Rate Last Admin  . 0.9 %  sodium chloride infusion  250 mL Intravenous PRN Angelia Mould, MD      . sodium chloride flush (NS) 0.9 % injection 3 mL  3 mL Intravenous Q12H Angelia Mould, MD      . sodium chloride flush (NS) 0.9 % injection 3 mL  3 mL Intravenous PRN Angelia Mould, MD        ROS:   General:  No weight loss, Fever, chills  HEENT: No recent headaches, no nasal bleeding, no visual changes, no sore throat  Neurologic: No dizziness, blackouts, seizures. No recent symptoms of stroke or mini- stroke. No recent episodes of slurred speech, or temporary blindness.  Cardiac: No recent episodes of chest pain/pressure, no shortness of breath at rest.  + shortness of breath with exertion.  + history of atrial fibrillation or irregular  heartbeat  Vascular: No history of rest pain in feet.  No history of claudication.  + history of non-healing ulcer, No history of DVT   Pulmonary: No home oxygen, no productive cough, no hemoptysis,  No asthma or wheezing  Musculoskeletal:  [X]  Arthritis, [ ]  Low back pain,  [X]  Joint pain  Hematologic:No history of hypercoagulable state.  No history of easy bleeding.  No history of anemia  Gastrointestinal: No hematochezia or melena,  No gastroesophageal reflux, no trouble swallowing  Urinary: [X]  chronic Kidney disease, [X]  on HD - [ ]  MWF or [X]  TTHS, [ ]  Burning with urination, [ ]  Frequent urination, [ ]  Difficulty urinating;   Skin: No rashes  Psychological: No history of anxiety,  No history of depression   Physical Examination  Vitals:   05/15/20 1433  BP: (!) 101/56  Pulse: 64  Resp: 20  Temp: (!) 97.3 F (36.3 C)  SpO2: 95%  Weight: 182 lb (82.6 kg)  Height: 5\' 9"  (1.753 m)    Body mass index is 26.88 kg/m.  General:  Alert and oriented, no acute distress HEENT: Normal Neck: No JVD Cardiac: Regular Rate and Rhythm Abdomen: Soft, non-tender, non-distended, no mass Skin: No rash, necrotic ulcer dorsum right third toe, ruborous foot extending from the knee down to the foot right leg Extremity Pulses:  2+ radial, brachial, 1+ femoral, absent popliteal dorsalis pedis, posterior tibial pulses bilaterally Musculoskeletal: No deformity or edema  Neurologic: Upper and lower extremity motor 5/5 and symmetric  DATA:  Patient had bilateral ABIs performed today which were calcified bilaterally.  Toe pressure on the right and left was 0.  There were monophasic tibial waveforms.  Right lower extremity duplex exam showed monophasic flow in the distal superficial femoral artery popliteal and tibial arteries.  There was biphasic flow at the common femoral level.  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  Patient has a limb  threatening situation with a nonhealing wound and a ruborous obviously ischemic foot on the right side.   PLAN: It was emphasized to the patient that he should try to quit smoking.  I discussed with him and his daughter today that the cigarette smoking is a large culprit and arterial occlusive disease and this is leading him towards a limb threatening situation.   Aortogram lower extremity runoff possible intervention scheduled for May 23, 2020.  Risk benefits possible complications and procedure details were discussed with the patient and his daughter today.  We will stop his Xarelto 3 days prior to the procedure.   Ruta Hinds, MD [facility] Office: [phone]

## 2020-05-15 NOTE — H&P (View-Only) (Signed)
Referring Physician: Dr Theador Hawthorne  Patient name: Terry Graves. MRN: [id number] DOB: [birth year] Sex: male  REASON FOR CONSULT: Nonhealing right foot wound  HPI: Terry Graves. is a 85 y.o. male, he developed a wound on his right third toe several weeks ago and it has failed to heal.  He has developed redness in the right foot.  He does not walk much but does get around with a walker.  He has a long smoking history and does not really seem to be interested in quitting currently.  He has atrial fibrillation and is on Xarelto for this.  He is also on hemodialysis and dialyzes Tuesday Thursday Saturday.  He has not really had any prior arterial interventions that I know of however he states he may have had some type of vein procedure in the past.  Other medical problems include congestive failure, hyperlipidemia, hypertension, diabetes, sleep apnea which have been stable.  He currently has a right side dialysis catheter.  He states they are having some trouble with his fistula.  Past Medical History:  Diagnosis Date  . Anemia   . Arthritis   . Atrial fibrillation ([place])   . CHF (congestive heart failure) ([place])   . Chronic kidney disease    Stage 5  . Chronic kidney disease-mineral and bone disorder   . Headache   . HOH (hard of hearing)   . Hyperlipidemia   . Hypertension   . Hypothyroidism   . Peripheral vascular disease ([place])   . Proteinuria   . Sleep apnea    wears CPAP 12  . Type 2 diabetes mellitus with stage 5 chronic kidney disease ([place])   . Wears glasses    Past Surgical History:  Procedure Laterality Date  . A/V FISTULAGRAM N/A 03/14/2020   Procedure: A/V FISTULAGRAM - left arm;  Surgeon: Elam Dutch, MD;  Location: [facility];  Service: Cardiovascular;  Laterality: N/A;  . AV FISTULA PLACEMENT Left 01/01/2020   Procedure: LEFT ARM ARTERIOVENOUS (AV) FISTULA CREATION;  Surgeon: Angelia Mould, MD;  Location: [facility];  Service: Vascular;  Laterality:  Left;  . CATARACT EXTRACTION W/ INTRAOCULAR LENS  IMPLANT, BILATERAL    . COLONOSCOPY    . COLONOSCOPY N/A 04/09/2020   Procedure: COLONOSCOPY;  Surgeon: [name], MD;  [facility];  Service: Endoscopy;  Laterality: N/A;  . ESOPHAGOGASTRODUODENOSCOPY N/A 04/25/2019   Procedure: ESOPHAGOGASTRODUODENOSCOPY (EGD);  Surgeon: [name], MD;  [facility];  Service: Endoscopy;  Laterality: N/A;  . ESOPHAGOGASTRODUODENOSCOPY N/A 04/08/2020   Procedure: ESOPHAGOGASTRODUODENOSCOPY (EGD);  Surgeon: [name], MD;  [facility];  Service: Endoscopy;  Laterality: N/A;  . GIVENS CAPSULE STUDY N/A 04/16/2020   Procedure: GIVENS CAPSULE STUDY;  Surgeon: [name], MD;  [facility];  Service: Endoscopy;  Laterality: N/A;  730  . HIP ARTHROSCOPY Left   . INSERTION OF DIALYSIS CATHETER Right 01/01/2020   Procedure: INSERTION OF RIGHT INTERNAL JUGULAR DIALYSIS CATHETER;  Surgeon: Angelia Mould, MD;  Location: [facility];  Service: Vascular;  Laterality: Right;  . LIGATION OF COMPETING BRANCHES OF ARTERIOVENOUS FISTULA Left 03/28/2020   Procedure: SIDE BRANCH LIGATION TIMES THREE OF LEFT ARM  ARTERIOVENOUS FISTULA;  Surgeon: Marty Heck, MD;  Location: [facility];  Service: Vascular;  Laterality: Left;    Family History  Problem Relation Age of Onset  . Lung cancer Father     SOCIAL HISTORY:  Social History   Socioeconomic History  . Marital status: Widowed    Spouse name: Not on file  . Number of children: Not on file  . Years of education: Not on file  . Highest education level: Not on file  Occupational History  . Not on file  Tobacco Use  . Smoking status: Current Every Day Smoker    Packs/day: 0.25    Types: Cigars  . Smokeless tobacco: Never Used  Vaping Use  . Vaping Use: Never used  Substance and Sexual Activity  . Alcohol use: Yes    Comment: rarely  . Drug use: Never  . Sexual activity: Not on file  Other  Topics Concern  . Not on file  Social History Narrative  . Not on file   Social Determinants of Health   Financial Resource Strain:   . Difficulty of Paying Living Expenses:   Food Insecurity:   . Worried About Charity fundraiser in the Last Year:   . Arboriculturist in the Last Year:   Transportation Needs:   . Film/video editor (Medical):   Marland Kitchen Lack of Transportation (Non-Medical):   Physical Activity:   . Days of Exercise per Week:   . Minutes of Exercise per Session:   Stress:   . Feeling of Stress :   Social Connections:   . Frequency of Communication with Friends and Family:   . Frequency of Social Gatherings with Friends and Family:   . Attends Religious Services:   . Active Member of Clubs or Organizations:   . Attends Archivist Meetings:   Marland Kitchen Marital Status:   Intimate Partner Violence:   . Fear of Current or Ex-Partner:   . Emotionally Abused:   Marland Kitchen Physically Abused:   . Sexually Abused:     Allergies  Allergen Reactions  . Penicillins Rash and Other (See Comments)    Did it involve swelling of the face/tongue/throat, SOB, or low BP? No Did it involve sudden or severe rash/hives, skin peeling, or any reaction on the inside of your mouth or nose? Yes Did you need to seek medical attention at a hospital or doctor's office? Unknown When did it last happen?60 years If all above answers are "NO", may proceed with cephalosporin use.  . Quinine     UNSPECIFIED REACTION     Current Outpatient Medications  Medication Sig Dispense Refill  . acetaminophen (TYLENOL) 650 MG CR tablet Take 1,300 mg by mouth in the morning and at bedtime.    Marland Kitchen atorvastatin (LIPITOR) 40 MG tablet Take 40 mg by mouth at bedtime.     . carvedilol (COREG) 12.5 MG tablet Take 12.5 mg by mouth 2 (two) times daily.    Marland Kitchen docusate sodium (COLACE) 100 MG capsule Take 100 mg by mouth daily as needed for mild constipation.    Marland Kitchen HYDROcodone-acetaminophen (NORCO/VICODIN) 5-325 MG  tablet Take 1 tablet by mouth every 4 (four) hours as needed for moderate pain. 6 tablet 0  . levothyroxine (SYNTHROID) 25 MCG tablet Take 25 mcg by mouth daily before breakfast.    . Methoxy PEG-Epoetin Beta (MIRCERA IJ) Inject as directed as needed (Low hemoglobin).    . Multiple Vitamin (DAILY VITE PO) Take 800 mg by mouth daily. With Zinc    . nicotine (NICODERM CQ - DOSED IN MG/24 HOURS) 14 mg/24hr patch Place 1 patch (14 mg total) onto the skin daily. 30 patch 1  . nitroGLYCERIN (NITROSTAT) 0.4 MG SL tablet Place  0.4 mg under the tongue every 5 (five) minutes as needed for chest pain.    Marland Kitchen ondansetron (ZOFRAN) 4 MG tablet Take 1 tablet (4 mg total) by mouth every 6 (six) hours as needed for nausea. 20 tablet 0  . pantoprazole (PROTONIX) 40 MG tablet Take 40 mg by mouth at bedtime.     Marland Kitchen rOPINIRole (REQUIP) 0.5 MG tablet Take 0.5 mg by mouth at bedtime.    Marland Kitchen terazosin (HYTRIN) 1 MG capsule Take 1 mg by mouth at bedtime.    Alveda Reasons 15 MG TABS tablet Take 1 tablet (15 mg total) by mouth daily with supper. 42 tablet 0   Current Facility-Administered Medications  Medication Dose Route Frequency Provider Last Rate Last Admin  . 0.9 %  sodium chloride infusion  250 mL Intravenous PRN Angelia Mould, MD      . sodium chloride flush (NS) 0.9 % injection 3 mL  3 mL Intravenous Q12H Angelia Mould, MD      . sodium chloride flush (NS) 0.9 % injection 3 mL  3 mL Intravenous PRN Angelia Mould, MD        ROS:   General:  No weight loss, Fever, chills  HEENT: No recent headaches, no nasal bleeding, no visual changes, no sore throat  Neurologic: No dizziness, blackouts, seizures. No recent symptoms of stroke or mini- stroke. No recent episodes of slurred speech, or temporary blindness.  Cardiac: No recent episodes of chest pain/pressure, no shortness of breath at rest.  + shortness of breath with exertion.  + history of atrial fibrillation or irregular  heartbeat  Vascular: No history of rest pain in feet.  No history of claudication.  + history of non-healing ulcer, No history of DVT   Pulmonary: No home oxygen, no productive cough, no hemoptysis,  No asthma or wheezing  Musculoskeletal:  [X]  Arthritis, [ ]  Low back pain,  [X]  Joint pain  Hematologic:No history of hypercoagulable state.  No history of easy bleeding.  No history of anemia  Gastrointestinal: No hematochezia or melena,  No gastroesophageal reflux, no trouble swallowing  Urinary: [X]  chronic Kidney disease, [X]  on HD - [ ]  MWF or [X]  TTHS, [ ]  Burning with urination, [ ]  Frequent urination, [ ]  Difficulty urinating;   Skin: No rashes  Psychological: No history of anxiety,  No history of depression   Physical Examination  Vitals:   05/15/20 1433  BP: (!) 101/56  Pulse: 64  Resp: 20  Temp: (!) 97.3 F (36.3 C)  SpO2: 95%  Weight: 182 lb (82.6 kg)  Height: 5\' 9"  (1.753 m)    Body mass index is 26.88 kg/m.  General:  Alert and oriented, no acute distress HEENT: Normal Neck: No JVD Cardiac: Regular Rate and Rhythm Abdomen: Soft, non-tender, non-distended, no mass Skin: No rash, necrotic ulcer dorsum right third toe, ruborous foot extending from the knee down to the foot right leg Extremity Pulses:  2+ radial, brachial, 1+ femoral, absent popliteal dorsalis pedis, posterior tibial pulses bilaterally Musculoskeletal: No deformity or edema  Neurologic: Upper and lower extremity motor 5/5 and symmetric  DATA:  Patient had bilateral ABIs performed today which were calcified bilaterally.  Toe pressure on the right and left was 0.  There were monophasic tibial waveforms.  Right lower extremity duplex exam showed monophasic flow in the distal superficial femoral artery popliteal and tibial arteries.  There was biphasic flow at the common femoral level.  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  Patient has a limb  threatening situation with a nonhealing wound and a ruborous obviously ischemic foot on the right side.   PLAN: It was emphasized to the patient that he should try to quit smoking.  I discussed with him and his daughter today that the cigarette smoking is a large culprit and arterial occlusive disease and this is leading him towards a limb threatening situation.   Aortogram lower extremity runoff possible intervention scheduled for May 23, 2020.  Risk benefits possible complications and procedure details were discussed with the patient and his daughter today.  We will stop his Xarelto 3 days prior to the procedure.   Ruta Hinds, MD [facility] Office: [phone]

## 2020-05-16 ENCOUNTER — Other Ambulatory Visit: Payer: Self-pay

## 2020-05-16 MED ORDER — SODIUM CHLORIDE 0.9 % IV SOLN
250.0000 mL | INTRAVENOUS | Status: DC | PRN
Start: 2020-05-16 — End: 2022-06-01

## 2020-05-21 ENCOUNTER — Other Ambulatory Visit (HOSPITAL_COMMUNITY)
Admission: RE | Admit: 2020-05-21 | Discharge: 2020-05-21 | Disposition: A | Discharge status: Home / Self Care | Payer: Medicare Other | Source: Ambulatory Visit | Attending: Vascular Surgery | Admitting: Vascular Surgery

## 2020-05-21 DIAGNOSIS — Z01812 Encounter for preprocedural laboratory examination: Secondary | ICD-10-CM | POA: Diagnosis present

## 2020-05-21 DIAGNOSIS — Z20822 Contact with and (suspected) exposure to covid-19: Secondary | ICD-10-CM | POA: Diagnosis not present

## 2020-05-21 LAB — SARS CORONAVIRUS 2 (TAT 6-24 HRS): SARS Coronavirus 2: NEGATIVE

## 2020-05-23 ENCOUNTER — Ambulatory Visit (HOSPITAL_COMMUNITY)
Admission: RE | Admit: 2020-05-23 | Discharge: 2020-05-23 | Disposition: A | Discharge status: Home / Self Care | Payer: Medicare Other | Attending: Vascular Surgery | Admitting: Vascular Surgery

## 2020-05-23 ENCOUNTER — Encounter (HOSPITAL_COMMUNITY)
Admission: RE | Disposition: A | Discharge status: Home / Self Care | Payer: Self-pay | Source: Home / Self Care | Attending: Vascular Surgery

## 2020-05-23 DIAGNOSIS — E785 Hyperlipidemia, unspecified: Secondary | ICD-10-CM | POA: Diagnosis not present

## 2020-05-23 DIAGNOSIS — I509 Heart failure, unspecified: Secondary | ICD-10-CM | POA: Insufficient documentation

## 2020-05-23 DIAGNOSIS — Z79899 Other long term (current) drug therapy: Secondary | ICD-10-CM | POA: Insufficient documentation

## 2020-05-23 DIAGNOSIS — N185 Chronic kidney disease, stage 5: Secondary | ICD-10-CM | POA: Diagnosis not present

## 2020-05-23 DIAGNOSIS — E1151 Type 2 diabetes mellitus with diabetic peripheral angiopathy without gangrene: Secondary | ICD-10-CM | POA: Insufficient documentation

## 2020-05-23 DIAGNOSIS — I132 Hypertensive heart and chronic kidney disease with heart failure and with stage 5 chronic kidney disease, or end stage renal disease: Secondary | ICD-10-CM | POA: Diagnosis not present

## 2020-05-23 DIAGNOSIS — E039 Hypothyroidism, unspecified: Secondary | ICD-10-CM | POA: Insufficient documentation

## 2020-05-23 DIAGNOSIS — F17290 Nicotine dependence, other tobacco product, uncomplicated: Secondary | ICD-10-CM | POA: Insufficient documentation

## 2020-05-23 DIAGNOSIS — D649 Anemia, unspecified: Secondary | ICD-10-CM | POA: Diagnosis not present

## 2020-05-23 DIAGNOSIS — G4730 Sleep apnea, unspecified: Secondary | ICD-10-CM | POA: Insufficient documentation

## 2020-05-23 DIAGNOSIS — Z7901 Long term (current) use of anticoagulants: Secondary | ICD-10-CM | POA: Insufficient documentation

## 2020-05-23 DIAGNOSIS — I4891 Unspecified atrial fibrillation: Secondary | ICD-10-CM | POA: Diagnosis not present

## 2020-05-23 DIAGNOSIS — I70221 Atherosclerosis of native arteries of extremities with rest pain, right leg: Secondary | ICD-10-CM | POA: Insufficient documentation

## 2020-05-23 DIAGNOSIS — E1122 Type 2 diabetes mellitus with diabetic chronic kidney disease: Secondary | ICD-10-CM | POA: Insufficient documentation

## 2020-05-23 DIAGNOSIS — Z79890 Hormone replacement therapy (postmenopausal): Secondary | ICD-10-CM | POA: Diagnosis not present

## 2020-05-23 DIAGNOSIS — Z992 Dependence on renal dialysis: Secondary | ICD-10-CM | POA: Insufficient documentation

## 2020-05-23 HISTORY — PX: ABDOMINAL AORTOGRAM W/LOWER EXTREMITY: CATH118223

## 2020-05-23 LAB — POCT I-STAT, CHEM 8
BUN: 44 mg/dL — ABNORMAL HIGH (ref 8–23)
Calcium, Ion: 1.07 mmol/L — ABNORMAL LOW (ref 1.15–1.40)
Chloride: 93 mmol/L — ABNORMAL LOW (ref 98–111)
Creatinine, Ser: 4.1 mg/dL — ABNORMAL HIGH (ref 0.61–1.24)
Glucose, Bld: 156 mg/dL — ABNORMAL HIGH (ref 70–99)
HCT: 36 % — ABNORMAL LOW (ref 39.0–52.0)
Hemoglobin: 12.2 g/dL — ABNORMAL LOW (ref 13.0–17.0)
Potassium: 3.7 mmol/L (ref 3.5–5.1)
Sodium: 134 mmol/L — ABNORMAL LOW (ref 135–145)
TCO2: 32 mmol/L (ref 22–32)

## 2020-05-23 SURGERY — ANGIOGRAM, LOWER EXTREMITY
Anesthesia: Choice | Laterality: Bilateral

## 2020-05-23 SURGERY — ABDOMINAL AORTOGRAM W/LOWER EXTREMITY
Anesthesia: LOCAL | Laterality: Bilateral

## 2020-05-23 MED ORDER — HEPARIN (PORCINE) IN NACL 1000-0.9 UT/500ML-% IV SOLN
INTRAVENOUS | Status: AC
  Filled 2020-05-23: qty 1000

## 2020-05-23 MED ORDER — SODIUM CHLORIDE 0.9% FLUSH: 3.0000 mL | INTRAVENOUS | Status: DC | PRN

## 2020-05-23 MED ORDER — HEPARIN (PORCINE) IN NACL 1000-0.9 UT/500ML-% IV SOLN
INTRAVENOUS | Status: DC | PRN
  Administered 2020-05-23: 500 mL

## 2020-05-23 MED ORDER — MORPHINE SULFATE (PF) 2 MG/ML IV SOLN: 2.0000 mg | INTRAVENOUS | Status: DC | PRN

## 2020-05-23 MED ORDER — HYDRALAZINE HCL 20 MG/ML IJ SOLN: 5.0000 mg | INTRAMUSCULAR | Status: DC | PRN

## 2020-05-23 MED ORDER — ONDANSETRON HCL 4 MG/2ML IJ SOLN: 4.0000 mg | Freq: Four times a day (QID) | INTRAMUSCULAR | Status: DC | PRN

## 2020-05-23 MED ORDER — SODIUM CHLORIDE 0.9% FLUSH: 3.0000 mL | Freq: Two times a day (BID) | INTRAVENOUS | Status: DC

## 2020-05-23 MED ORDER — LIDOCAINE HCL (PF) 1 % IJ SOLN
INTRAMUSCULAR | Status: DC | PRN
  Administered 2020-05-23: 20 mL

## 2020-05-23 MED ORDER — LABETALOL HCL 5 MG/ML IV SOLN: 10.0000 mg | INTRAVENOUS | Status: DC | PRN

## 2020-05-23 MED ORDER — LIDOCAINE HCL (PF) 1 % IJ SOLN
INTRAMUSCULAR | Status: AC
  Filled 2020-05-23: qty 30

## 2020-05-23 MED ORDER — IODIXANOL 320 MG/ML IV SOLN
INTRAVENOUS | Status: DC | PRN
  Administered 2020-05-23: 130 mL

## 2020-05-23 MED ORDER — ACETAMINOPHEN 325 MG PO TABS: 650.0000 mg | ORAL_TABLET | ORAL | Status: DC | PRN

## 2020-05-23 MED ORDER — SODIUM CHLORIDE 0.9 % IV SOLN: 250.0000 mL | INTRAVENOUS | Status: DC | PRN

## 2020-05-23 MED ORDER — OXYCODONE HCL 5 MG PO TABS: 5.0000 mg | ORAL_TABLET | ORAL | Status: DC | PRN

## 2020-05-23 SURGICAL SUPPLY — 10 items
CATH ANGIO 5F BER2 65CM (CATHETERS) ×2 IMPLANT
CATH OMNI FLUSH 5F 65CM (CATHETERS) ×2 IMPLANT
CATH STRAIGHT 5FR 65CM (CATHETERS) ×2 IMPLANT
KIT PV (KITS) ×2 IMPLANT
SHEATH PINNACLE 5F 10CM (SHEATH) ×2 IMPLANT
SHEATH PROBE COVER 6X72 (BAG) ×2 IMPLANT
SYR MEDRAD MARK V 150ML (SYRINGE) ×2 IMPLANT
TRANSDUCER W/STOPCOCK (MISCELLANEOUS) ×2 IMPLANT
TRAY PV CATH (CUSTOM PROCEDURE TRAY) ×2 IMPLANT
WIRE BENTSON .035X145CM (WIRE) ×2 IMPLANT

## 2020-05-23 NOTE — Discharge Instructions (Signed)
Femoral Site Care This sheet gives you information about how to care for yourself after your procedure. Your health care provider may also give you more specific instructions. If you have problems or questions, contact your health care provider. What can I expect after the procedure?  After the procedure, it is common to have:  Bruising that usually fades within 1-2 weeks.  Tenderness at the site. Follow these instructions at home: Wound care 1. Follow instructions from your health care provider about how to take care of your insertion site. Make sure you: ? Wash your hands with soap and water before you change your bandage (dressing). If soap and water are not available, use hand sanitizer. ? Remove your dressing as told by your health care provider. In 24 hours 2. Do not take baths, swim, or use a hot tub until your health care provider approves. 3. You may shower 24-48 hours after the procedure or as told by your health care provider. ? Gently wash the site with plain soap and water. ? Pat the area dry with a clean towel. ? Do not rub the site. This may cause bleeding. 4. Do not apply powder or lotion to the site. Keep the site clean and dry. 5. Check your femoral site every day for signs of infection. Check for: ? Redness, swelling, or pain. ? Fluid or blood. ? Warmth. ? Pus or a bad smell. Activity 1. For the first 2-3 days after your procedure, or as long as directed: ? Avoid climbing stairs as much as possible. ? Do not squat. 2. Do not lift anything that is heavier than 10 lb (4.5 kg), or the limit that you are told, until your health care provider says that it is safe. For 5 days 3. Rest as directed. ? Avoid sitting for a long time without moving. Get up to take short walks every 1-2 hours. 4. Do not drive for 24 hours if you were given a medicine to help you relax (sedative). General instructions  Take over-the-counter and prescription medicines only as told by your  health care provider.  Keep all follow-up visits as told by your health care provider. This is important. Contact a health care provider if you have:  A fever or chills.  You have redness, swelling, or pain around your insertion site. Get help right away if:  The catheter insertion area swells very fast.  You pass out.  You suddenly start to sweat or your skin gets clammy.  The catheter insertion area is bleeding, and the bleeding does not stop when you hold steady pressure on the area.  The area near or just beyond the catheter insertion site becomes pale, cool, tingly, or numb. These symptoms may represent a serious problem that is an emergency. Do not wait to see if the symptoms will go away. Get medical help right away. Call your local emergency services (911 in the [place] Do not drive yourself to the hospital. Summary  After the procedure, it is common to have bruising that usually fades within 1-2 weeks.  Check your femoral site every day for signs of infection.  Do not lift anything that is heavier than 10 lb (4.5 kg), or the limit that you are told, until your health care provider says that it is safe. This information is not intended to replace advice given to you by your health care provider. Make sure you discuss any questions you have with your health care provider. Document Revised: 10/10/2017 Document Reviewed: 10/10/2017   Elsevier Patient Education  2020 [employer]   

## 2020-05-23 NOTE — Op Note (Signed)
Procedure: Abdominal aortogram with bilateral lower extremity runoff, third order catheterization right superficial femoral artery  Preoperative diagnosis: Rest pain right foot  Postoperative diagnosis: Same  Anesthesia: Local  Operative findings: Severe bilateral tibial occlusive disease  One-vessel peroneal runoff to the right foot  Operative details: After pain informed consent, the patient taken the PV lab.  The patient's placed in supine position angio table.  Both groins were prepped and draped in usual sterile fashion.  Local anesthesia was infiltrated of the left common femoral artery.  Ultrasound was used to identify left common femoral artery and femoral bifurcation.  Introducer needle was then used to cancer left common femoral artery and a 035 Bentson wire advanced into the abdominal aorta and fluoroscopic guidance.  A 5 French sheath was placed over the guidewire in the left common femoral artery and thoroughly flushed with heparinized saline.  A 5 French Omni Flush catheter was then advanced in the abdominal aorta and abdominal aortogram obtained in AP projection.  The left and right renal arteries are patent.  Infrarenal abdominal aorta is patent.  Left and right common internal and external iliac arteries are all patent.  At this point the pigtail catheter was pulled down just above the aortic bifurcation and bilateral lower extremity runoff views were obtained through the catheter.  In the left lower extremity the left common femoral profunda femoris and popliteal artery is patent.  The proximal portion of the B arteries were patent but due to motion artifact these were obscured distally.  In the right lower extremity the right common femoral profunda femoris and popliteal artery is patent.  Again the proximal portion of the tibial vessels is patent but then motion artifact obscures the distal circulation.  At this point the Omni Flush catheter was removed over guidewire after it  advancing the guidewire all the way into the right common femoral artery.  I then used an 035 Berenstein catheter to direct the guidewire into the right superficial femoral artery and then advanced the 5 French straight catheter over this into the mid right superficial femoral artery.  Right lower extremity runoff view was then obtained.  The right popliteal artery is patent.  The anterior tibial artery occludes just after its origin.  It reconstitutes distally as the dorsalis pedis artery via peroneal collaterals.  The posterior tibial artery is occluded in the mid leg.  The peroneal artery is patent all the way down to the level of the ankle and gives [place] an anterior communicating branch which fills the dorsalis pedis.  Posterior communicating branch is occluded.  At this point the procedure was concluded and the 5 French straight catheter removed.  The 5 French sheath was left in place to be pulled the holding area.  Operative management: The patient has pain in the right first toe and duskiness but no open wound currently.  He does not really have revascularization options as he has inline flow all the way down to the foot through the peroneal artery.  Only options long-term would be consideration for amputation of the right first toe or potentially a right below-knee amputation for control of pain symptoms.  Hopefully his pain will improve with time as the toe heals.  I will arrange for him to come back and see me in follow-up in about a month.  If he decides he wants amputation of the right first toe or below-knee amputation before then he will call me.  Ruta Hinds, MD [facility]  Office: [phone]

## 2020-05-23 NOTE — Interval H&P Note (Signed)
History and Physical Interval Note:  05/23/2020 8:49 AM  Terry Graves.  has presented today for surgery, with the diagnosis of PAD.  The various methods of treatment have been discussed with the patient and family. After consideration of risks, benefits and other options for treatment, the patient has consented to  Procedure(s): ABDOMINAL AORTOGRAM W/LOWER EXTREMITY (Bilateral) as a surgical intervention.  The patient's history has been reviewed, patient examined, no change in status, stable for surgery.  I have reviewed the patient's chart and labs.  Questions were answered to the patient's satisfaction.     Ruta Hinds

## 2020-05-23 NOTE — Progress Notes (Signed)
Site area: Left groin a 5 french arterial sheath was removed  Site Prior to Removal:  Level 0  Pressure Applied For 20 MINUTES    Bedrest Beginning at 1100am  Manual:   Yes.    Patient Status During Pull:  stable  Post Pull Groin Site:  Level 0  Post Pull Instructions Given:  Yes.    Post Pull Pulses Present:  Yes.    Dressing Applied:  Yes.    Comments:

## 2020-05-26 ENCOUNTER — Encounter (HOSPITAL_COMMUNITY): Payer: Self-pay | Admitting: Vascular Surgery

## 2020-06-18 ENCOUNTER — Other Ambulatory Visit: Payer: Self-pay

## 2020-06-18 ENCOUNTER — Ambulatory Visit (INDEPENDENT_AMBULATORY_CARE_PROVIDER_SITE_OTHER): Payer: Medicare Other | Admitting: Vascular Surgery

## 2020-06-18 ENCOUNTER — Encounter: Payer: Self-pay | Admitting: Vascular Surgery

## 2020-06-18 VITALS — BP 103/45 | HR 79 | Temp 97.7°F | Resp 16 | Ht 69.0 in | Wt 180.0 lb

## 2020-06-18 DIAGNOSIS — I739 Peripheral vascular disease, unspecified: Secondary | ICD-10-CM

## 2020-06-18 NOTE — Progress Notes (Signed)
Patient returns for follow-up today.  He recently underwent aortogram with lower extremity runoff which showed diffuse tibial disease with inline flow via the peroneal.  He still has pain intermittently in the right foot.  He has an ulceration on the dorsum of the right fourth toe.  This has failed to heal.  He states his needle arterial puncture site has healed up fine.  He continues to smoke.  Review of systems: Patient ambulates with a walker.  He has shortness of breath with exertion.  He does not have chest pain.  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  I discussed with the patient today the possibility of a right below-knee amputation for pain control versus continued local wound care.  Plan: The patient is opted for local wound care for now.  He will try again to quit smoking.  He states they have been having some trouble with his AV access and I told him to discuss this further with his nephrologist and we could consider evaluation of this if warranted.  He is currently dialyzing by right sided catheter.  Ruta Hinds, MD [facility] [place] Office: [phone]

## 2020-07-25 ENCOUNTER — Other Ambulatory Visit: Payer: Self-pay

## 2020-07-30 ENCOUNTER — Other Ambulatory Visit (HOSPITAL_COMMUNITY): Payer: Medicare Other

## 2020-08-06 ENCOUNTER — Other Ambulatory Visit (HOSPITAL_COMMUNITY)
Admission: RE | Admit: 2020-08-06 | Discharge: 2020-08-06 | Disposition: A | Discharge status: Home / Self Care | Payer: Medicare Other | Source: Ambulatory Visit | Attending: Surgery | Admitting: Surgery

## 2020-08-06 DIAGNOSIS — Z01812 Encounter for preprocedural laboratory examination: Secondary | ICD-10-CM | POA: Diagnosis present

## 2020-08-06 DIAGNOSIS — Z20822 Contact with and (suspected) exposure to covid-19: Secondary | ICD-10-CM | POA: Insufficient documentation

## 2020-08-06 LAB — SARS CORONAVIRUS 2 (TAT 6-24 HRS): SARS Coronavirus 2: NEGATIVE

## 2020-08-08 ENCOUNTER — Other Ambulatory Visit: Payer: Self-pay

## 2020-08-08 ENCOUNTER — Ambulatory Visit (HOSPITAL_COMMUNITY)
Admission: RE | Admit: 2020-08-08 | Discharge: 2020-08-08 | Disposition: A | Discharge status: Home / Self Care | Payer: Medicare Other | Source: Ambulatory Visit | Attending: Surgery | Admitting: Surgery

## 2020-08-08 DIAGNOSIS — N186 End stage renal disease: Secondary | ICD-10-CM | POA: Diagnosis not present

## 2020-08-08 DIAGNOSIS — Z4901 Encounter for fitting and adjustment of extracorporeal dialysis catheter: Secondary | ICD-10-CM | POA: Diagnosis present

## 2020-08-08 MED ORDER — LIDOCAINE HCL (PF) 2 % IJ SOLN
INTRAMUSCULAR | Status: AC
  Filled 2020-08-08: qty 10

## 2020-08-08 NOTE — Progress Notes (Signed)
  Procedure Note   PRE-OPERATIVE DIAGNOSIS:   ESRD.   POST-OPERATIVE DIAGNOSIS:  ESRD.  PROCEDURE:  Removal of right IJ perm cath.   PROVIDER:   Barbie Banner, PA-C  ANESTHESIA:  Local with 1% lidocaine  The risks and benefits of this procedure were explained to the patient and the patient consented.   OPERATIVE PROCEDURE:  The following procedure was performed at the bedside.  The right side of patient's neck and chest was prepped and draped in standard fashion.  Local anesthesia was infiltrated over the tunneled catheter and its cuff.  The cuff was loosened using a combination of blunt and sharp dissection.  The catheter was removed in its entirety, and hemostasis was achieved with local compression.    The patient tolerated the procedure well and did not have any complications.  RECOMMENDATIONS:   Do not lie flat until bedtime. May remove dressing and shower in 24 hours. If oozing occurs, remain upright and hold pressure.  Call our office if these measures fail to control bleeding. Inspect dressing and if no bleeding or oozing, may resume Xarelto tonight.     Barbie Banner, PA-C [facility] [phone] 08/08/2020  11:44 AM

## 2020-11-21 IMAGING — DX DG CHEST 1V PORT
1 series · 1 of 1 positions shown · non-contrast
Comparison: 04/24/2019

CLINICAL DATA: End-stage renal disease

EXAM:
PORTABLE CHEST 1 VIEW

[chest ap]
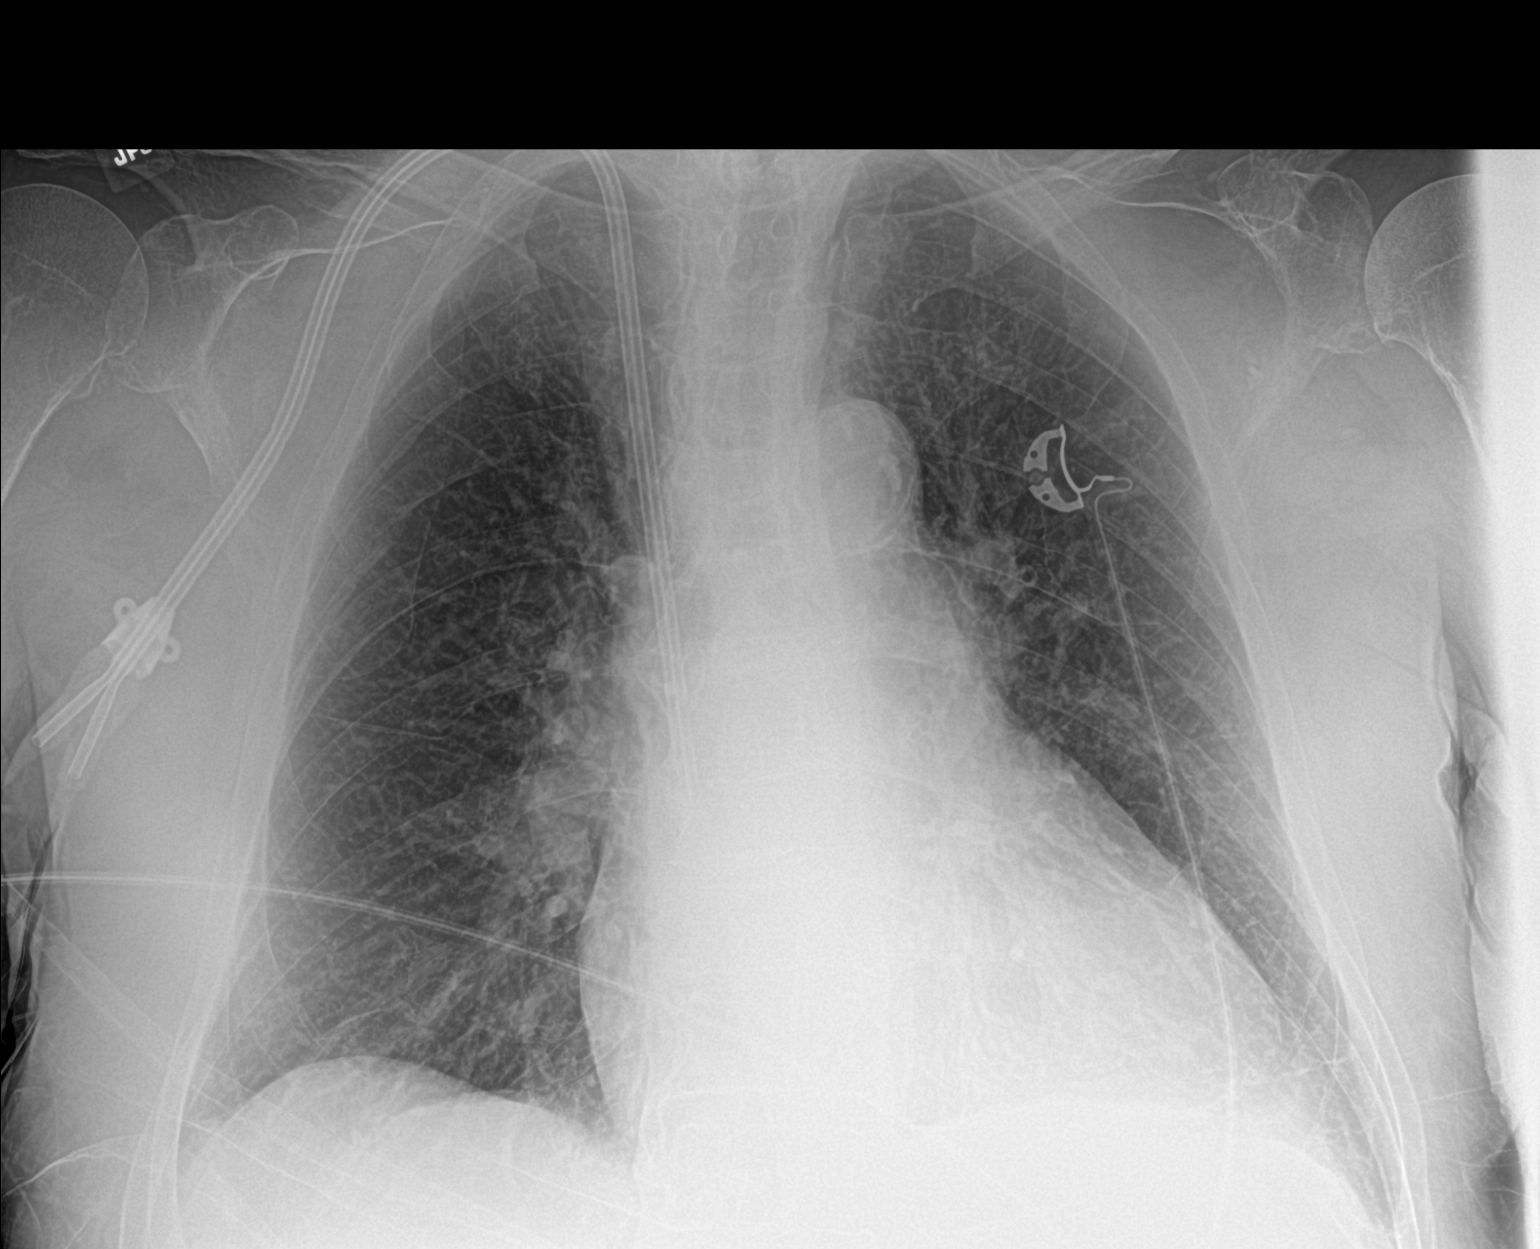

[1 of 1 positions shown; findings below may reference images not displayed]

FINDINGS: There is a dual lumen right-sided central venous catheter in
satisfactory position. There is mild bilateral diffuse interstitial
thickening. There is no pleural effusion or pneumothorax. There is
stable cardiomegaly.

There is no acute osseous abnormality.
IMPRESSION: Cardiomegaly with mild pulmonary vascular congestion. Dual lumen
right-sided central venous catheter in satisfactory position.

## 2022-05-20 ENCOUNTER — Encounter (HOSPITAL_COMMUNITY): Payer: Self-pay | Admitting: Emergency Medicine

## 2022-05-20 ENCOUNTER — Emergency Department (HOSPITAL_COMMUNITY): Payer: Medicare Other

## 2022-05-20 ENCOUNTER — Inpatient Hospital Stay (HOSPITAL_COMMUNITY): Payer: Medicare Other

## 2022-05-20 ENCOUNTER — Other Ambulatory Visit: Payer: Self-pay

## 2022-05-20 ENCOUNTER — Inpatient Hospital Stay (HOSPITAL_COMMUNITY)
Admission: EM | Admit: 2022-05-20 | Discharge: 2022-06-01 | DRG: 393 | Disposition: A | Discharge status: Skilled Nursing Facility | Payer: Medicare Other | Attending: Family Medicine | Admitting: Family Medicine

## 2022-05-20 DIAGNOSIS — R531 Weakness: Secondary | ICD-10-CM | POA: Diagnosis not present

## 2022-05-20 DIAGNOSIS — E876 Hypokalemia: Secondary | ICD-10-CM | POA: Diagnosis present

## 2022-05-20 DIAGNOSIS — J189 Pneumonia, unspecified organism: Secondary | ICD-10-CM | POA: Diagnosis not present

## 2022-05-20 DIAGNOSIS — D62 Acute posthemorrhagic anemia: Secondary | ICD-10-CM | POA: Diagnosis present

## 2022-05-20 DIAGNOSIS — F17290 Nicotine dependence, other tobacco product, uncomplicated: Secondary | ICD-10-CM | POA: Diagnosis present

## 2022-05-20 DIAGNOSIS — K5521 Angiodysplasia of colon with hemorrhage: Secondary | ICD-10-CM | POA: Diagnosis present

## 2022-05-20 DIAGNOSIS — G4730 Sleep apnea, unspecified: Secondary | ICD-10-CM | POA: Diagnosis present

## 2022-05-20 DIAGNOSIS — G2581 Restless legs syndrome: Secondary | ICD-10-CM | POA: Diagnosis present

## 2022-05-20 DIAGNOSIS — G4733 Obstructive sleep apnea (adult) (pediatric): Secondary | ICD-10-CM | POA: Diagnosis not present

## 2022-05-20 DIAGNOSIS — E871 Hypo-osmolality and hyponatremia: Secondary | ICD-10-CM | POA: Diagnosis not present

## 2022-05-20 DIAGNOSIS — N186 End stage renal disease: Secondary | ICD-10-CM | POA: Diagnosis present

## 2022-05-20 DIAGNOSIS — R6521 Severe sepsis with septic shock: Secondary | ICD-10-CM | POA: Diagnosis not present

## 2022-05-20 DIAGNOSIS — Z800 Family history of malignant neoplasm of digestive organs: Secondary | ICD-10-CM

## 2022-05-20 DIAGNOSIS — E1165 Type 2 diabetes mellitus with hyperglycemia: Secondary | ICD-10-CM | POA: Diagnosis present

## 2022-05-20 DIAGNOSIS — R6520 Severe sepsis without septic shock: Secondary | ICD-10-CM | POA: Diagnosis not present

## 2022-05-20 DIAGNOSIS — E1122 Type 2 diabetes mellitus with diabetic chronic kidney disease: Secondary | ICD-10-CM | POA: Diagnosis present

## 2022-05-20 DIAGNOSIS — E039 Hypothyroidism, unspecified: Secondary | ICD-10-CM | POA: Diagnosis present

## 2022-05-20 DIAGNOSIS — J918 Pleural effusion in other conditions classified elsewhere: Secondary | ICD-10-CM | POA: Diagnosis not present

## 2022-05-20 DIAGNOSIS — K641 Second degree hemorrhoids: Secondary | ICD-10-CM | POA: Diagnosis present

## 2022-05-20 DIAGNOSIS — A419 Sepsis, unspecified organism: Secondary | ICD-10-CM | POA: Diagnosis not present

## 2022-05-20 DIAGNOSIS — Z515 Encounter for palliative care: Secondary | ICD-10-CM | POA: Diagnosis not present

## 2022-05-20 DIAGNOSIS — I5032 Chronic diastolic (congestive) heart failure: Secondary | ICD-10-CM | POA: Diagnosis present

## 2022-05-20 DIAGNOSIS — R0609 Other forms of dyspnea: Secondary | ICD-10-CM | POA: Diagnosis not present

## 2022-05-20 DIAGNOSIS — E1151 Type 2 diabetes mellitus with diabetic peripheral angiopathy without gangrene: Secondary | ICD-10-CM | POA: Diagnosis present

## 2022-05-20 DIAGNOSIS — K921 Melena: Secondary | ICD-10-CM

## 2022-05-20 DIAGNOSIS — Z993 Dependence on wheelchair: Secondary | ICD-10-CM

## 2022-05-20 DIAGNOSIS — Z7901 Long term (current) use of anticoagulants: Secondary | ICD-10-CM

## 2022-05-20 DIAGNOSIS — R627 Adult failure to thrive: Secondary | ICD-10-CM | POA: Diagnosis not present

## 2022-05-20 DIAGNOSIS — Z9989 Dependence on other enabling machines and devices: Secondary | ICD-10-CM | POA: Diagnosis not present

## 2022-05-20 DIAGNOSIS — K635 Polyp of colon: Secondary | ICD-10-CM | POA: Diagnosis present

## 2022-05-20 DIAGNOSIS — R1311 Dysphagia, oral phase: Secondary | ICD-10-CM | POA: Diagnosis present

## 2022-05-20 DIAGNOSIS — Z992 Dependence on renal dialysis: Secondary | ICD-10-CM

## 2022-05-20 DIAGNOSIS — I959 Hypotension, unspecified: Secondary | ICD-10-CM | POA: Diagnosis not present

## 2022-05-20 DIAGNOSIS — I482 Chronic atrial fibrillation, unspecified: Secondary | ICD-10-CM | POA: Insufficient documentation

## 2022-05-20 DIAGNOSIS — M1990 Unspecified osteoarthritis, unspecified site: Secondary | ICD-10-CM | POA: Diagnosis not present

## 2022-05-20 DIAGNOSIS — K219 Gastro-esophageal reflux disease without esophagitis: Secondary | ICD-10-CM | POA: Diagnosis present

## 2022-05-20 DIAGNOSIS — D519 Vitamin B12 deficiency anemia, unspecified: Secondary | ICD-10-CM | POA: Diagnosis present

## 2022-05-20 DIAGNOSIS — R911 Solitary pulmonary nodule: Secondary | ICD-10-CM | POA: Diagnosis present

## 2022-05-20 DIAGNOSIS — Z66 Do not resuscitate: Secondary | ICD-10-CM | POA: Diagnosis present

## 2022-05-20 DIAGNOSIS — K5520 Angiodysplasia of colon without hemorrhage: Secondary | ICD-10-CM | POA: Diagnosis not present

## 2022-05-20 DIAGNOSIS — D631 Anemia in chronic kidney disease: Secondary | ICD-10-CM | POA: Diagnosis present

## 2022-05-20 DIAGNOSIS — R578 Other shock: Secondary | ICD-10-CM | POA: Diagnosis present

## 2022-05-20 DIAGNOSIS — K5731 Diverticulosis of large intestine without perforation or abscess with bleeding: Secondary | ICD-10-CM | POA: Diagnosis not present

## 2022-05-20 DIAGNOSIS — Z89511 Acquired absence of right leg below knee: Secondary | ICD-10-CM

## 2022-05-20 DIAGNOSIS — Z888 Allergy status to other drugs, medicaments and biological substances status: Secondary | ICD-10-CM

## 2022-05-20 DIAGNOSIS — M898X9 Other specified disorders of bone, unspecified site: Secondary | ICD-10-CM | POA: Diagnosis present

## 2022-05-20 DIAGNOSIS — K5733 Diverticulitis of large intestine without perforation or abscess with bleeding: Secondary | ICD-10-CM | POA: Diagnosis present

## 2022-05-20 DIAGNOSIS — J90 Pleural effusion, not elsewhere classified: Secondary | ICD-10-CM | POA: Diagnosis present

## 2022-05-20 DIAGNOSIS — K6381 Dieulafoy lesion of intestine: Principal | ICD-10-CM | POA: Diagnosis present

## 2022-05-20 DIAGNOSIS — Z79890 Hormone replacement therapy (postmenopausal): Secondary | ICD-10-CM

## 2022-05-20 DIAGNOSIS — R3915 Urgency of urination: Secondary | ICD-10-CM | POA: Diagnosis present

## 2022-05-20 DIAGNOSIS — R791 Abnormal coagulation profile: Secondary | ICD-10-CM | POA: Diagnosis present

## 2022-05-20 DIAGNOSIS — D649 Anemia, unspecified: Secondary | ICD-10-CM | POA: Diagnosis not present

## 2022-05-20 DIAGNOSIS — I132 Hypertensive heart and chronic kidney disease with heart failure and with stage 5 chronic kidney disease, or end stage renal disease: Secondary | ICD-10-CM | POA: Diagnosis present

## 2022-05-20 DIAGNOSIS — K5730 Diverticulosis of large intestine without perforation or abscess without bleeding: Secondary | ICD-10-CM | POA: Diagnosis not present

## 2022-05-20 DIAGNOSIS — Z8711 Personal history of peptic ulcer disease: Secondary | ICD-10-CM

## 2022-05-20 DIAGNOSIS — R5381 Other malaise: Secondary | ICD-10-CM | POA: Diagnosis present

## 2022-05-20 DIAGNOSIS — K922 Gastrointestinal hemorrhage, unspecified: Secondary | ICD-10-CM | POA: Diagnosis present

## 2022-05-20 DIAGNOSIS — K648 Other hemorrhoids: Secondary | ICD-10-CM | POA: Diagnosis not present

## 2022-05-20 DIAGNOSIS — D696 Thrombocytopenia, unspecified: Secondary | ICD-10-CM | POA: Diagnosis present

## 2022-05-20 DIAGNOSIS — R918 Other nonspecific abnormal finding of lung field: Secondary | ICD-10-CM | POA: Diagnosis not present

## 2022-05-20 DIAGNOSIS — J9601 Acute respiratory failure with hypoxia: Secondary | ICD-10-CM | POA: Diagnosis present

## 2022-05-20 DIAGNOSIS — E785 Hyperlipidemia, unspecified: Secondary | ICD-10-CM | POA: Diagnosis present

## 2022-05-20 DIAGNOSIS — J690 Pneumonitis due to inhalation of food and vomit: Secondary | ICD-10-CM | POA: Diagnosis not present

## 2022-05-20 DIAGNOSIS — Z89512 Acquired absence of left leg below knee: Secondary | ICD-10-CM

## 2022-05-20 DIAGNOSIS — Z880 Allergy status to penicillin: Secondary | ICD-10-CM

## 2022-05-20 DIAGNOSIS — Z79899 Other long term (current) drug therapy: Secondary | ICD-10-CM

## 2022-05-20 DIAGNOSIS — E538 Deficiency of other specified B group vitamins: Secondary | ICD-10-CM | POA: Diagnosis present

## 2022-05-20 DIAGNOSIS — Z801 Family history of malignant neoplasm of trachea, bronchus and lung: Secondary | ICD-10-CM

## 2022-05-20 HISTORY — DX: End stage renal disease: N18.6

## 2022-05-20 HISTORY — DX: End stage renal disease: Z99.2

## 2022-05-20 LAB — PROTIME-INR
INR: 1.4 — ABNORMAL HIGH (ref 0.8–1.2)
Prothrombin Time: 16.5 seconds — ABNORMAL HIGH (ref 11.4–15.2)

## 2022-05-20 LAB — CBC
HCT: 18.9 % — ABNORMAL LOW (ref 39.0–52.0)
Hemoglobin: 5.9 g/dL — CL (ref 13.0–17.0)
MCH: 30.9 pg (ref 26.0–34.0)
MCHC: 31.2 g/dL (ref 30.0–36.0)
MCV: 99 fL (ref 80.0–100.0)
Platelets: 151 10*3/uL (ref 150–400)
RBC: 1.91 MIL/uL — ABNORMAL LOW (ref 4.22–5.81)
RDW: 17.8 % — ABNORMAL HIGH (ref 11.5–15.5)
WBC: 4.9 10*3/uL (ref 4.0–10.5)
nRBC: 0 % (ref 0.0–0.2)

## 2022-05-20 LAB — HEMOGLOBIN AND HEMATOCRIT, BLOOD
HCT: 26.8 % — ABNORMAL LOW (ref 39.0–52.0)
Hemoglobin: 9 g/dL — ABNORMAL LOW (ref 13.0–17.0)

## 2022-05-20 LAB — PREPARE RBC (CROSSMATCH)

## 2022-05-20 LAB — COMPREHENSIVE METABOLIC PANEL
ALT: 10 U/L (ref 0–44)
AST: 13 U/L — ABNORMAL LOW (ref 15–41)
Albumin: 2.3 g/dL — ABNORMAL LOW (ref 3.5–5.0)
Alkaline Phosphatase: 101 U/L (ref 38–126)
Anion gap: 6 (ref 5–15)
BUN: 14 mg/dL (ref 8–23)
CO2: 32 mmol/L (ref 22–32)
Calcium: 7.8 mg/dL — ABNORMAL LOW (ref 8.9–10.3)
Chloride: 99 mmol/L (ref 98–111)
Creatinine, Ser: 2.35 mg/dL — ABNORMAL HIGH (ref 0.61–1.24)
GFR, Estimated: 26 mL/min — ABNORMAL LOW (ref >60)
Glucose, Bld: 134 mg/dL — ABNORMAL HIGH (ref 70–99)
Potassium: 3.1 mmol/L — ABNORMAL LOW (ref 3.5–5.1)
Sodium: 137 mmol/L (ref 135–145)
Total Bilirubin: 0.6 mg/dL (ref 0.3–1.2)
Total Protein: 5.1 g/dL — ABNORMAL LOW (ref 6.5–8.1)

## 2022-05-20 LAB — FERRITIN: Ferritin: 1069 ng/mL — ABNORMAL HIGH (ref 24–336)

## 2022-05-20 LAB — HEMOGLOBIN A1C
Hgb A1c MFr Bld: 4.8 % (ref 4.8–5.6)
Mean Plasma Glucose: 91.06 mg/dL

## 2022-05-20 LAB — IRON AND TIBC
Iron: 78 ug/dL (ref 45–182)
Saturation Ratios: 53 % — ABNORMAL HIGH (ref 17.9–39.5)
TIBC: 147 ug/dL — ABNORMAL LOW (ref 250–450)
UIBC: 69 ug/dL

## 2022-05-20 LAB — MRSA NEXT GEN BY PCR, NASAL: MRSA by PCR Next Gen: NOT DETECTED

## 2022-05-20 LAB — VITAMIN B12: Vitamin B-12: 129 pg/mL — ABNORMAL LOW (ref 180–914)

## 2022-05-20 LAB — GLUCOSE, CAPILLARY
Glucose-Capillary: 99 mg/dL (ref 70–99)
Glucose-Capillary: 109 mg/dL — ABNORMAL HIGH (ref 70–99)

## 2022-05-20 LAB — RETICULOCYTES
Immature Retic Fract: 33 % — ABNORMAL HIGH (ref 2.3–15.9)
RBC.: 1.91 MIL/uL — ABNORMAL LOW (ref 4.22–5.81)
Retic Count, Absolute: 135.9 10*3/uL (ref 19.0–186.0)
Retic Ct Pct: 7.3 % — ABNORMAL HIGH (ref 0.4–3.1)

## 2022-05-20 LAB — FOLATE: Folate: 36.2 ng/mL (ref >5.9)

## 2022-05-20 MED ORDER — PANTOPRAZOLE 80MG IVPB - SIMPLE MED
80.0000 mg | Freq: Once | INTRAVENOUS | Status: AC
  Administered 2022-05-20: 80 mg via INTRAVENOUS
  Filled 2022-05-20: qty 80

## 2022-05-20 MED ORDER — PANTOPRAZOLE INFUSION (NEW) - SIMPLE MED
8.0000 mg/h | INTRAVENOUS | Status: DC
  Administered 2022-05-20 – 2022-05-21 (×3): 8 mg/h via INTRAVENOUS
  Filled 2022-05-20 (×3): qty 80

## 2022-05-20 MED ORDER — SODIUM CHLORIDE 0.9 % IV SOLN: INTRAVENOUS | Status: DC | PRN

## 2022-05-20 MED ORDER — IOHEXOL 350 MG/ML SOLN
100.0000 mL | Freq: Once | INTRAVENOUS | Status: AC | PRN
  Administered 2022-05-20: 100 mL via INTRAVENOUS

## 2022-05-20 MED ORDER — MAGNESIUM SULFATE 2 GM/50ML IV SOLN
2.0000 g | Freq: Once | INTRAVENOUS | Status: AC
  Administered 2022-05-20: 2 g via INTRAVENOUS
  Filled 2022-05-20: qty 50

## 2022-05-20 MED ORDER — IPRATROPIUM-ALBUTEROL 0.5-2.5 (3) MG/3ML IN SOLN
3.0000 mL | Freq: Four times a day (QID) | RESPIRATORY_TRACT | Status: DC | PRN
  Administered 2022-05-26 – 2022-06-01 (×8): 3 mL via RESPIRATORY_TRACT
  Filled 2022-05-20 (×7): qty 3

## 2022-05-20 MED ORDER — POLYETHYLENE GLYCOL 3350 17 G PO PACK: 17.0000 g | PACK | Freq: Every day | ORAL | Status: DC | PRN

## 2022-05-20 MED ORDER — SODIUM CHLORIDE 0.9% IV SOLUTION: Freq: Once | INTRAVENOUS | Status: DC

## 2022-05-20 MED ORDER — SODIUM CHLORIDE 0.9 % IV SOLN
10.0000 mL/h | Freq: Once | INTRAVENOUS | Status: AC
  Administered 2022-05-20: 10 mL/h via INTRAVENOUS

## 2022-05-20 MED ORDER — POTASSIUM CHLORIDE 10 MEQ/100ML IV SOLN
10.0000 meq | INTRAVENOUS | Status: AC
  Administered 2022-05-20 (×4): 10 meq via INTRAVENOUS
  Filled 2022-05-20 (×4): qty 100

## 2022-05-20 MED ORDER — DOCUSATE SODIUM 100 MG PO CAPS: 100.0000 mg | ORAL_CAPSULE | Freq: Two times a day (BID) | ORAL | Status: DC | PRN

## 2022-05-20 NOTE — ED Provider Notes (Signed)
[facility] EMERGENCY DEPARTMENT Provider Note   CSN: [id number] Arrival date & time: 05/20/22  1156     History  Chief Complaint  Patient presents with   Rectal Bleeding    Terry Graves. is a 87 y.o. male with past medical history of ESRD on Monday Wednesday Friday dialysis, GERD, heart failure with preserved ejection fraction, chronic atrial fibrillation previously on Xarelto with recent discontinuation in the setting of GI bleed, recent hospitalization at [facility] where the patient was found to have bleeding from colonic diverticulitis as well as small bowel AVM bleed who presents emergency room today for persistent bright red blood per rectum.  Patient's discharge hemoglobin was 9.6 70s prior to presentation to emergency department today.  The patient states that he had persistent bleeding on a daily basis since with approximately 1 cup of estimated blood loss per day.  He has not had any associated hematemesis.  No history of significant EtOH use per patient.  Patient denies fevers or chills.  Patient states that he has not taken his Xarelto since the onset of his GI bleed 2 weeks prior.  Patient has been compliant with dialysis on Monday Wednesday Friday schedule with last session yesterday   Rectal Bleeding Associated symptoms: abdominal pain   Associated symptoms: no fever and no vomiting        Home Medications Prior to Admission medications   Medication Sig Start Date End Date Taking? Authorizing Provider  acetaminophen (TYLENOL) 650 MG CR tablet Take 1,300 mg by mouth in the morning and at bedtime.    [provider]  atorvastatin (LIPITOR) 40 MG tablet Take 40 mg by mouth at bedtime.     Fran Lowes, MD  carvedilol (COREG) 12.5 MG tablet Take 12.5 mg by mouth 2 (two) times daily. Does not take morning dose Tues Thurs and Sat before Dialysis 11/07/19   [provider]  docusate sodium (COLACE) 100 MG capsule  Take 100 mg by mouth daily as needed for mild constipation.    [provider]  levothyroxine (SYNTHROID) 25 MCG tablet Take 25 mcg by mouth daily before breakfast.    [provider]  Methoxy PEG-Epoetin Beta (MIRCERA IJ) Inject as directed as needed (Low hemoglobin).    [provider]  Multiple Vitamin (DAILY VITE PO) Take 800 mg by mouth daily. With Zinc    [provider]  nicotine (NICODERM CQ - DOSED IN MG/24 HOURS) 14 mg/24hr patch Place 1 patch (14 mg total) onto the skin daily. 04/10/20   Roxan Hockey, MD  nitroGLYCERIN (NITROSTAT) 0.4 MG SL tablet Place 0.4 mg under the tongue every 5 (five) minutes as needed for chest pain.    Fran Lowes, MD  ondansetron (ZOFRAN) 4 MG tablet Take 1 tablet (4 mg total) by mouth every 6 (six) hours as needed for nausea. 04/09/20   Roxan Hockey, MD  pantoprazole (PROTONIX) 40 MG tablet Take 40 mg by mouth at bedtime.     Fran Lowes, MD  rOPINIRole (REQUIP) 0.5 MG tablet Take 0.5 mg by mouth at bedtime.    [provider]  terazosin (HYTRIN) 1 MG capsule Take 1 mg by mouth at bedtime.    Fran Lowes, MD  XARELTO 15 MG TABS tablet Take 1 tablet (15 mg total) by mouth daily with supper. 04/10/20   Roxan Hockey, MD      Allergies    Penicillins and Quinine    Review of Systems   Review  of Systems  Constitutional:  Negative for chills and fever.  HENT:  Negative for ear pain and sore throat.   Eyes:  Negative for pain and visual disturbance.  Respiratory:  Negative for cough and shortness of breath.   Cardiovascular:  Negative for chest pain and palpitations.  Gastrointestinal:  Positive for abdominal pain, anal bleeding, blood in stool, diarrhea and hematochezia. Negative for vomiting.  Genitourinary:  Negative for dysuria and hematuria.  Musculoskeletal:  Negative for arthralgias and back pain.  Skin:  Negative for color change and rash.  Neurological:  Negative for seizures  and syncope.  All other systems reviewed and are negative.   Physical Exam Updated Vital Signs BP (!) 96/50 (BP Location: Right Arm)   Pulse 79   Temp 98.2 F (36.8 C) (Oral)   Resp 20   SpO2 98%  Physical Exam Vitals and nursing note reviewed.  Constitutional:      General: He is not in acute distress.    Appearance: He is well-developed.     Comments: Upon entering the exam room, the patient is lying in bed awake and alert.  He appears chronically but not acutely ill.  HENT:     Head: Normocephalic and atraumatic.  Eyes:     Conjunctiva/sclera: Conjunctivae normal.  Cardiovascular:     Rate and Rhythm: Normal rate and regular rhythm.     Heart sounds: No murmur heard.    Comments: Normal sinus rhythm appreciated on the monitor.  No murmurs or gallops. Pulmonary:     Effort: Pulmonary effort is normal. No respiratory distress.     Breath sounds: Normal breath sounds.     Comments: CTAB.  Saturating well on room air.  Speaking in full sentences without difficulty. Abdominal:     Palpations: Abdomen is soft.     Tenderness: There is no abdominal tenderness.     Comments: Left lower quadrant tenderness without rebound or guarding.  Genitourinary:    Comments: Rectal exam reveals bright red blood.  No brisk active bleeding identified Musculoskeletal:        General: No swelling.     Cervical back: Neck supple.  Skin:    General: Skin is warm and dry.     Capillary Refill: Capillary refill takes less than 2 seconds.  Neurological:     Mental Status: He is alert.  Psychiatric:        Mood and Affect: Mood normal.     ED Results / Procedures / Treatments   Labs (all labs ordered are listed, but only abnormal results are displayed) Labs Reviewed  CBC - Abnormal; Notable for the following components:      Result Value   RBC 1.91 (*)    Hemoglobin 5.9 (*)    HCT 18.9 (*)    RDW 17.8 (*)    All other components within normal limits  COMPREHENSIVE METABOLIC PANEL   VITAMIN A07  FOLATE  IRON AND TIBC  FERRITIN  RETICULOCYTES  POC OCCULT BLOOD, ED  TYPE AND SCREEN    EKG None  Radiology No results found.  Procedures Procedures    Medications Ordered in ED Medications - No data to display  ED Course/ Medical Decision Making/ A&P Clinical Course as of 05/20/22 1615  Thu May 20, 2022  1506 W. 87yoM p/w GI bleed w hemodynamic instability. Hgb 5.9 at presentation, getting 2U pRBCs [ ]  GI consult [ ]  CTA-- call CT to get scan [ ]  eval response to pRBCs to determine  dispo (ICU vs IMC/floor) [BR]    Clinical Course User Index [BR] Faylene Million, MD                           Medical Decision Making Amount and/or Complexity of Data Reviewed Labs: ordered. Radiology: ordered.  Risk Prescription drug management.   The patient presents to the emergency department hypotensive, mentating appropriately in no acute distress with persistent grossly bloody stool as above in the setting of recent admission remarkable for diverticular bleed.  Patient is most likely experiencing recurrence and or persistence of his diverticular bleed.  Denies ethanol use as above and thus unlikely variceal in nature especially in the setting of a comprehensive workup at the outside hospital that did not mention varices.  I have personally reviewed the physical paperwork the family has brought to bedside regarding the patient's prior hospitalization.  I was unable to locate this in care everywhere.  Suspect the patient is not mounting tachycardic response as he is taking carvedilol.  Will order 2 units PRBC and crossmatched an additional 2 units.  Although the patient does not appear to have brisk bleeding at this time on exam he states that he feels he is going to have a large bloody bowel movement soon, thus will obtain CTA to determine if patient is a candidate for embolization.  This also help assess for potential complication of diverticulitis.  Additional  reassessment reveals that the patient maintains hypotension to the 80s over 30s on the mental status and is appropriate.  Plan to follow-up with CTA abdomen and determine if patient is candidate for IR.  Will assess patient response to blood product to determine if patient is a candidate for stepdown versus need for critical care admission.  The care of this patient was transitioned to Dr. Harle Battiest at 1500 discussion of plan as above          Final Clinical Impression(s) / ED Diagnoses Final diagnoses:  Gastrointestinal hemorrhage, unspecified gastrointestinal hemorrhage type    Rx / DC Orders ED Discharge Orders     None         Levie Heritage, MD 05/20/22 1622    Gareth Morgan, MD 05/20/22 2213

## 2022-05-20 NOTE — Consult Note (Signed)
Referring Provider:  Faylene Million, MD       Primary Care Physician:  [name], MD Primary Gastroenterologist: Dr. Laural Golden     Reason for Consultation:  Hematochezia                 ASSESSMENT /  PLAN    Hematochezia Anemia Patient presents with hematochezia for the last 2 weeks.  He had a prolonged hospitalization last week during which he had a colonoscopy that reportedly showed diverticular bleeding and possible small bowel AVM. Unfortunately we do not have his colonoscopy report from last week available for review.  Hemoglobin dropped from 10.7 on 7/24 to 7.6 on 8/7 to 5.9 upon arrival here. MCV is 99. His anemia workup here shows no signs of iron deficiency but instead shows vitamin B12 deficiency.  His anemia is likely multifactorial since the patient probably has low erythropoietin production due to ESRD and anemia of chronic disease.  BUN upon arrival is normal, suggesting against upper GI bleed.  CTA A/P was done that was negative for acute GI hemorrhage.  We will continue to monitor his blood counts here and look for continued clinical signs of GI bleeding.  In the meantime we will also attempt to obtain records from his recent hospitalization at [facility] to guide whether further endoscopic procedures are needed.  Patient was initially hypotensive upon arrival but after blood transfusions, his blood pressure has normalized.  - Trend Hb. Transfuse for Hb<7 - Please obtain records from recent hospitalization at [facility] including colonoscopy report  HPI:   Dallas Torok. is a 87 y.o. male with history of A-fib on Xarelto, diverticulosis, prior PUD, CHF, ESRD on HD, OSA, and DM presents with hematochezia.  He states that he developed hematochezia about 2 weeks ago. He went to [facility] where he was admitted for 8 days. He received several units of pRBCs during that hospitalization. He had a colonoscopy during that hospitalization that reportedly  showed that he had bleeding due to diverticulosis. He was discharged when they thought that his bleeding had slowed down. Unfortunately he continued to have hematochezia at home. Last episode of rectal bleeding was a few hours ago. Has some lower abdominal pain when he is about to have a BM. Denies N&V, dysphagia. Has had loose stools due to bloody BMs. His last dose of Xarelto was 2 weeks ago. Denies use of NSAIDs. Mother had stomach cancer, and sister had colon cancer in her 82s. Denies SOB or lightheadedness after he received his blood transfusions.   GI History:  EGD 05/09/14: Prepyloric ulcer with no evidence of active bleeding or stigmata of recent bleeding.  EGD 04/25/19: - Normal esophagus. - Z-line irregular, 40 cm from the incisors. - Gastritis. - Non-bleeding gastric ulcer with no stigmata of bleeding. - Normal duodenal bulb and second portion of the duodenum. - No specimens collected.  EGD 04/08/20: - Normal hypopharynx. - Normal esophagus. - Z-line irregular, 42 cm from the incisors. - Portal hypertensive gastropathy. Few small hyperplastic appearing polyps at body. These were left alone. - Normal duodenal bulb and second portion of the duodenum. - No specimens collected. comment; gastric ulcer has healed since EGD of July, 2020. no bleeding lesion notedin UGI tract.  Colonoscopy 04/09/20: - The examined portion of the ileum was normal. - Diverticulosis in the sigmoid colon. - External hemorrhoids. - No specimens collected.  VCE 04/16/20: Findings:   Patient was able to swallow given capsule without any difficulty.  Study duration 8 hours and 19 minutes. Study is complete as given capsule reached cecum. Small duodenal diverticulum best seen on image at 001:1:44. Mucosa of small bowel was otherwise unremarkable. First Gastric image: 42 sec First Duodenal image: 6 min and 39 sec First Ileo-Cecal Valve image: 5 hrs 21 min and 39 sec First Cecal image: 5 hrs 21 min and 45  sec Gastric Passage time: 5 min and 57 sec Small Bowel Passage time: 5 hrs and 15 min Summary & Recommendations: No mucosal abnormalities such as erosions ulcers or AV malformations noted in small bowel. Single small duodenal diverticulum without stigmata of bleed.  Past Medical History:  Diagnosis Date   Anemia    Arthritis    Atrial fibrillation (HCC)    CHF (congestive heart failure) (HCC)    Chronic kidney disease    Stage 5   Chronic kidney disease-mineral and bone disorder    Headache    HOH (hard of hearing)    Hyperlipidemia    Hypertension    Hypothyroidism    Peripheral vascular disease (HCC)    Proteinuria    Sleep apnea    wears CPAP 12   Type 2 diabetes mellitus with stage 5 chronic kidney disease ([place])    Wears glasses     Past Surgical History:  Procedure Laterality Date   A/V FISTULAGRAM N/A 03/14/2020   Procedure: A/V FISTULAGRAM - left arm;  Surgeon: Elam Dutch, MD;  Location: [place] [facility];  Service: Cardiovascular;  Laterality: N/A;   ABDOMINAL AORTOGRAM W/LOWER EXTREMITY Bilateral 05/23/2020   Procedure: ABDOMINAL AORTOGRAM W/LOWER EXTREMITY;  Surgeon: Elam Dutch, MD;  Location: [place] [facility];  Service: Cardiovascular;  Laterality: Bilateral;  Bilateral    AV FISTULA PLACEMENT Left 01/01/2020   Procedure: LEFT ARM ARTERIOVENOUS (AV) FISTULA CREATION;  Surgeon: Angelia Mould, MD;  Location: [facility] OR;  Service: Vascular;  Laterality: Left;   CATARACT EXTRACTION W/ INTRAOCULAR LENS  IMPLANT, BILATERAL     COLONOSCOPY     COLONOSCOPY N/A 04/09/2020   Procedure: COLONOSCOPY;  Surgeon: [name], MD;  [facility];  Service: Endoscopy;  Laterality: N/A;   ESOPHAGOGASTRODUODENOSCOPY N/A 04/25/2019   Procedure: ESOPHAGOGASTRODUODENOSCOPY (EGD);  Surgeon: [name], MD;  [facility];  Service: Endoscopy;  Laterality: N/A;   ESOPHAGOGASTRODUODENOSCOPY N/A 04/08/2020   Procedure:  ESOPHAGOGASTRODUODENOSCOPY (EGD);  Surgeon: [name], MD;  [facility];  Service: Endoscopy;  Laterality: N/A;   GIVENS CAPSULE STUDY N/A 04/16/2020   Procedure: GIVENS CAPSULE STUDY;  Surgeon: [name], MD;  [facility];  Service: Endoscopy;  Laterality: N/A;  730   HIP ARTHROSCOPY Left    INSERTION OF DIALYSIS CATHETER Right 01/01/2020   Procedure: INSERTION OF RIGHT INTERNAL JUGULAR DIALYSIS CATHETER;  Surgeon: Angelia Mould, MD;  Location: [facility] OR;  Service: Vascular;  Laterality: Right;   LIGATION OF COMPETING BRANCHES OF ARTERIOVENOUS FISTULA Left 03/28/2020   Procedure: SIDE BRANCH LIGATION TIMES THREE OF LEFT ARM  ARTERIOVENOUS FISTULA;  Surgeon: Marty Heck, MD;  Location: [place];  Service: Vascular;  Laterality: Left;    Prior to Admission medications   Medication Sig Start Date End Date Taking? Authorizing Provider  acetaminophen (TYLENOL) 650 MG CR tablet Take 1,300 mg by mouth in the morning and at bedtime.    [provider]  atorvastatin (LIPITOR) 40 MG tablet Take 40 mg by mouth at bedtime.     Befekadu,  Eli Phillips, MD  carvedilol (COREG) 12.5 MG tablet Take 12.5 mg by mouth 2 (two) times daily. Does not take morning dose Tues Thurs and Sat before Dialysis 11/07/19   [provider]  Cholecalciferol (VITAMIN D3) 125 MCG (5000 UT) CAPS Take 1 capsule by mouth daily. 03/11/22   [provider]  docusate sodium (COLACE) 100 MG capsule Take 100 mg by mouth daily as needed for mild constipation.    [provider]  levothyroxine (SYNTHROID) 25 MCG tablet Take 25 mcg by mouth daily before breakfast.    [provider]  Methoxy PEG-Epoetin Beta (MIRCERA IJ) Inject as directed as needed (Low hemoglobin).    [provider]  Multiple Vitamin (DAILY VITE PO) Take 800 mg by mouth daily. With Zinc    [provider]  nicotine (NICODERM CQ - DOSED IN MG/24 HOURS) 14 mg/24hr patch Place  1 patch (14 mg total) onto the skin daily. 04/10/20   Roxan Hockey, MD  nitroGLYCERIN (NITROSTAT) 0.4 MG SL tablet Place 0.4 mg under the tongue every 5 (five) minutes as needed for chest pain.    Fran Lowes, MD  ondansetron (ZOFRAN) 4 MG tablet Take 1 tablet (4 mg total) by mouth every 6 (six) hours as needed for nausea. 04/09/20   Roxan Hockey, MD  pantoprazole (PROTONIX) 40 MG tablet Take 40 mg by mouth at bedtime.     Fran Lowes, MD  rOPINIRole (REQUIP) 0.5 MG tablet Take 0.5 mg by mouth at bedtime.    [provider]  terazosin (HYTRIN) 1 MG capsule Take 1 mg by mouth at bedtime.    Fran Lowes, MD  XARELTO 15 MG TABS tablet Take 1 tablet (15 mg total) by mouth daily with supper. 04/10/20   Roxan Hockey, MD    Current Facility-Administered Medications  Medication Dose Route Frequency Provider Last Rate Last Admin   0.9 %  sodium chloride infusion (Manually program via Guardrails IV Fluids)   Intravenous Once Levie Heritage, MD       docusate sodium (COLACE) capsule 100 mg  100 mg Oral BID PRN Mick Sell, PA-C       ipratropium-albuterol (DUONEB) 0.5-2.5 (3) MG/3ML nebulizer solution 3 mL  3 mL Nebulization Q6H PRN Mick Sell, PA-C       magnesium sulfate IVPB 2 g 50 mL  2 g Intravenous Once Payne, John D, PA-C       pantoprozole (PROTONIX) 80 mg /NS 100 mL infusion  8 mg/hr Intravenous Continuous Levie Heritage, MD 10 mL/hr at 05/20/22 1736 8 mg/hr at 05/20/22 1736   polyethylene glycol (MIRALAX / GLYCOLAX) packet 17 g  17 g Oral Daily PRN Mick Sell, PA-C       potassium chloride 10 mEq in 100 mL IVPB  10 mEq Intravenous Q1 Hr x 4 Payne, John D, PA-C        Allergies as of 05/20/2022 - Review Complete 05/20/2022  Allergen Reaction Noted   Penicillins Rash and Other (See Comments) 11/08/2018   Quinine  07/26/2019    Family History  Problem Relation Age of Onset   Lung cancer Father     Social History   Tobacco Use   Smoking  status: Every Day    Packs/day: 0.25    Types: Cigars, Cigarettes   Smokeless tobacco: Never  Vaping Use   Vaping Use: Never used  Substance Use Topics   Alcohol use: Yes    Comment: rarely   Drug use: Never  Review of Systems: All systems reviewed and negative except where noted in HPI.  Physical Exam: Vital signs in last 24 hours: Temp:  [97.5 F (36.4 C)-98.3 F (36.8 C)] 97.5 F (36.4 C) (08/10 1836) Pulse Rate:  [79-87] 80 (08/10 1730) Resp:  [16-22] 17 (08/10 1745) BP: (81-105)/(27-60) 105/60 (08/10 1745) SpO2:  [93 %-99 %] 93 % (08/10 1745)   General:   Awake, alert, NAD Psych:  Pleasant, cooperative. Normal mood and affect. Eyes:  Pupils equal, sclera clear, no icterus.    Neck:  Supple Lungs:  No increased WOB Heart:  Regular rate Abdomen:  Soft, non-distended, nontender Msk:  Symmetrical without gross deformities Neurologic:  Alert and  oriented x4;  grossly normal neurologically. Skin:  Intact without significant lesions or rashes.  Intake/Output from previous day: No intake/output data recorded. Intake/Output this shift: No intake/output data recorded.  Lab Results: Recent Labs    05/20/22 1313  WBC 4.9  HGB 5.9*  HCT 18.9*  PLT 151   BMET Recent Labs    05/20/22 1313  NA 137  K 3.1*  CL 99  CO2 32  GLUCOSE 134*  BUN 14  CREATININE 2.35*  CALCIUM 7.8*   LFT Recent Labs    05/20/22 1313  PROT 5.1*  ALBUMIN 2.3*  AST 13*  ALT 10  ALKPHOS 101  BILITOT 0.6   PT/INR Recent Labs    05/20/22 1448  LABPROT 16.5*  INR 1.4*   Hepatitis Panel No results for input(s): "HEPBSAG", "HCVAB", "HEPAIGM", "HEPBIGM" in the last 72 hours.   .    Latest Ref Rng & Units 05/20/2022    1:13 PM 05/23/2020    7:58 AM 04/09/2020    4:55 AM  CBC  WBC 4.0 - 10.5 K/uL 4.9   5.5   Hemoglobin 13.0 - 17.0 g/dL 5.9  12.2  9.8   Hematocrit 39.0 - 52.0 % 18.9  36.0  30.3   Platelets 150 - 400 K/uL 151   137     .    Latest Ref Rng & Units  05/20/2022    1:13 PM 05/23/2020    7:58 AM 04/09/2020    4:55 AM  CMP  Glucose 70 - 99 mg/dL 134  156  123   BUN 8 - 23 mg/dL 14  44  30   Creatinine 0.61 - 1.24 mg/dL 2.35  4.10  3.15   Sodium 135 - 145 mmol/L 137  134  132   Potassium 3.5 - 5.1 mmol/L 3.1  3.7  3.1   Chloride 98 - 111 mmol/L 99  93  96   CO2 22 - 32 mmol/L 32   27   Calcium 8.9 - 10.3 mg/dL 7.8   8.2   Total Protein 6.5 - 8.1 g/dL 5.1     Total Bilirubin 0.3 - 1.2 mg/dL 0.6     Alkaline Phos 38 - 126 U/L 101     AST 15 - 41 U/L 13     ALT 0 - 44 U/L 10      Studies/Results: CT Angio Abd/Pel W and/or Wo Contrast  Result Date: 05/20/2022 CLINICAL DATA:  87 year old male with a history of lower GI bleeding EXAM: CTA ABDOMEN AND PELVIS WITHOUT AND WITH CONTRAST TECHNIQUE: Multidetector CT imaging of the abdomen and pelvis was performed using the standard protocol during bolus administration of intravenous contrast. Multiplanar reconstructed images and MIPs were obtained and reviewed to evaluate the vascular anatomy. RADIATION DOSE REDUCTION: This exam was performed  according to the departmental dose-optimization program which includes automated exposure control, adjustment of the mA and/or kV according to patient size and/or use of iterative reconstruction technique. CONTRAST:  158mL OMNIPAQUE IOHEXOL 350 MG/ML SOLN COMPARISON:  CT 04/24/2019 FINDINGS: VASCULAR Aorta: Mild atherosclerotic changes of the lower thoracic aorta and the abdominal aorta. No pedunculated plaque, ulcerated plaque, dissection. No aneurysm. No periaortic fluid or inflammatory changes. Celiac: Celiac artery patent.  Branches patent. SMA: SMA patent with mild atherosclerosis at the origin and estimated less than 50% narrowing. Branches are patent. Renals: - Right: Single right renal artery. Atherosclerotic changes at the origin. Estimated 50% narrowing secondary to calcified plaque. - Left: Single left renal artery. Atherosclerotic changes at the origin with  at least 50% narrowing secondary to calcified plaque. IMA: Inferior mesenteric artery is patent. Right lower extremity: Mild tortuosity of the right iliac system. Mild atherosclerotic changes without evidence of high-grade stenosis of the common iliac artery or external iliac artery. Hypogastric artery is patent. Pelvic arteries patent. Common femoral artery patent with mild atherosclerosis. Mixed calcified and soft plaque in the proximal right superficial femoral artery, which appears occluded after the origin. Profunda femoris patent. Left lower extremity: Tortuosity of the left iliac system. Mild atherosclerosis without high-grade stenosis or occlusion of the common iliac artery and external iliac artery. Hypogastric artery is patent as well as pelvic arteries. Common femoral artery patent with mild atherosclerosis. Proximal profunda femoris and SFA patent. Veins: Unremarkable appearance of the venous system. Review of the MIP images confirms the above findings. NON-VASCULAR Lower chest: Bilateral pleural effusions at the lung bases. Thickening of the left-sided visceral and parietal pleura, new from the prior of 04/24/2019. Nodule at the left lung base measures 2.3 cm, new from the comparison. Atelectasis bilateral lung bases. Hepatobiliary: Unremarkable appearance of the liver. Calcified stones within the gallbladder lumen. Pancreas: Rounded hypodense lesion within the pancreas body, levin mm, new from the comparison. No inflammatory changes of pancreas. Spleen: Unremarkable. Adrenals/Urinary Tract: - Right adrenal gland: Unremarkable - Left adrenal gland: Unremarkable. - Right kidney: No hydronephrosis. Small nonobstructing stones in the calyceal tips. Unremarkable right ureter. - Left Kidney: No hydronephrosis. Nonobstructing stones at the calyceal tips. Unremarkable course of the left ureter. - Urinary Bladder: Bladder is contracted. Stomach/Bowel: - Stomach: Unremarkable. - Small bowel: Small bowel  decompressed with no transition point. No inflammatory changes. No puddling of contrast. - Appendix: Normal. - Colon: Colon decompressed with no transition point. No puddling of contrast. No wall thickening. No inflammatory changes. Left-sided diverticular disease. No transition point. Lymphatic: No adenopathy. Mesenteric: Mild edema within the mesentery. No free fluid or significant ascites. Reproductive: Unremarkable Other: Body wall edema/anasarca Musculoskeletal: Osteopenia. New compression fractures of L2 and L4. No significant height loss at L2. Approximately 50% height loss at L4. No significant retropulsion of the posterior endplates. Progression of the L1 compression fracture with approximately 50% height loss, progressed from the comparison. No bony canal narrowing. Degenerative changes of the hips. IMPRESSION: CT angiogram negative for evidence of acute gastrointestinal hemorrhage. Bilateral pleural effusions. The left side demonstrates pleural thickening of the visceral and parietal pleura, and empyema cannot be excluded. Given this finding as well as the presence of an indeterminate 2.3 cm lung nodule on the left, complete chest CT imaging is recommended. Mild mesenteric and body wall edema/anasarca, suggesting fluid positive state. No overt pulmonary edema or ascites. Multilevel peripheral arterial disease, including mild aortic atherosclerosis, bilateral iliac atherosclerosis, and proximal femoral arterial disease in this patient with  history of hemodialysis. Likely proximal right SFA occlusion. Aortic Atherosclerosis (ICD10-I70.0). 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 New compression fractures of L2 and L4, as above. There has been progression of prior L1 compression fracture. If symptomatic, further evaluation with MR lumbar spine may be considered to determine acuity/chronicity. Additional ancillary findings as above. Signed, Dulcy Fanny.  Nadene Rubins, RPVI [facility] Electronically Signed   By: Corrie Mckusick D.O.   On: 05/20/2022 16:32    Principal Problem:   GI bleed  Christia Reading, M.D. @  05/20/2022, 6:43 PM

## 2022-05-20 NOTE — ED Provider Triage Note (Signed)
Emergency Medicine Provider Triage Evaluation Note  Terry Graves. , a 87 y.o. male  was evaluated in triage.  Pt complains of bloody stools and fatigue for several weeks. MWF dialysis patient, hasn't missed any treatments. Hemoglobin on Monday was 7.6. Having multiple dark bloody stools with intermittent bright red blood multiple times per day.   Review of Systems  Positive: Bloody stools, fatigue Negative: Vomiting, syncope  Physical Exam  BP (!) 96/50 (BP Location: Right Arm)   Pulse 79   Temp 98.2 F (36.8 C) (Oral)   Resp 20   SpO2 98%  Gen:   Awake, no distress   Resp:  Normal effort  MSK:   Moves extremities without difficulty  Other:  Appears fatigued, very pale, > 3 sec capillary refill  Medical Decision Making  Medically screening exam initiated at 1:13 PM.  Appropriate orders placed.  Terry Graves. was informed that the remainder of the evaluation will be completed by another provider, this initial triage assessment does not replace that evaluation, and the importance of remaining in the ED until their evaluation is complete.     Terry Graves T, PA-C 05/20/22 1315

## 2022-05-20 NOTE — ED Provider Notes (Signed)
  Vitals  BP (!) 87/34   Pulse 83   Temp 98.3 F (36.8 C) (Oral)   Resp 16   SpO2 98%   Procedures  Procedures  ED Course / MDM   Clinical Course as of 05/20/22 1517  Thu May 20, 2022  1506 W. 87yoM p/w GI bleed w hemodynamic instability. Hgb 5.9 at presentation, getting 2U pRBCs [ ]  GI consult [ ]  CTA-- call CT to get scan [ ]  eval response to pRBCs to determine dispo (ICU vs IMC/floor) [BR]    Clinical Course User Index [BR] Faylene Million, MD   Medical Decision Making Amount and/or Complexity of Data Reviewed Labs: ordered. Radiology: ordered.  Risk Prescription drug management. Decision regarding hospitalization.   From initial provider note: "87 y.o. male with past medical history of ESRD on Monday Wednesday Friday dialysis, GERD, heart failure with preserved ejection fraction, chronic atrial fibrillation previously on Xarelto with recent discontinuation in the setting of GI bleed, recent hospitalization at [facility] where the patient was found to have bleeding from colonic diverticulitis as well as small bowel AVM bleed who presents emergency room today for persistent bright red blood per rectum."  Plan at time of my assumption of care was: -Follow-up CTA -GI has been consulted, appreciate recs -Evaluate response to blood transfusions to determine dispo  MDM I went and assessed the patient, found him resting comfortably in bed.  Patient denies any immediate needs.  CTA was obtained, per radiology report did not demonstrate any evidence of active extravasation.  Patient received 1 unit packed red blood cells and systolics remain in the 01U.  I contacted ICU for admission.  They will plan to evaluate the patient while in the ED.  Patient has been ordered for 2 units packed red cells in total.  Second unit started.  Blood pressure improving to 272 systolic.  I contacted patient's GI doctor to make them aware of this patient.  They will plan to follow  along as a consult, provided the patient becomes hemodynamically stable.  Patient accepted for admission to ICU.     Faylene Million, MD 05/20/22 2350    Drenda Freeze, MD 05/21/22 [phone]

## 2022-05-20 NOTE — Progress Notes (Signed)
Pt arrived to 73m 02 with two shirts, glasses, cell phone, undergarments, and shorts.

## 2022-05-20 NOTE — H&P (Signed)
NAME:  Terry Graves., MRN:  [id number], DOB:  Mar 31, 1935, LOS: 0 ADMISSION DATE:  05/20/2022, CONSULTATION DATE:  8/10 REFERRING MD:  Dr. Billy Fischer, CHIEF COMPLAINT:  GI bleed   History of Present Illness:  Patient is a 87 year old male with pertinent PMH of ESRD (dialysis M, W, F), hypothyroidism, GERD, HFpEF, chronic A-fib on Xarelto (off Xarelto since 7/26), OSA on CPAP, recent GI bleed presents to [facility] ED on 8/10 with GI bleed.  Patient was recently admitted to [facility] on 7/26.  Noted to have colonic diverticulitis and small bowel AVM bleed.  Home Xarelto was held.  Patient given PRBC for low hemoglobin.  Patient was discharged about a week ago and to follow-up with GI outpatient.  Since discharge patient has still had persistent bleeding with BM about a cup of blood per day.  States he really has not had no hematemesis,, maybe once.  No heavy EtOH abuse.  Denies fevers, SOB, chest pain.  Does have some elbow pain and LLQ with bowel movements.  Patient presents to [facility] on 8/10 with GI bleed.  Patient mildly hypotensive,, otherwise hemodynamically stable.  Afebrile.  On room air.  Initial Hgb 5.9.  Given 2 units PRBCs.  Patient's MAP less than 65 despite PRBCs. K 3.1, creat 2.35 (close to baseline), WBC 4.9.  PCCM consulted for ICU admission.   Pertinent  Medical History   Past Medical History:  Diagnosis Date   Anemia    Arthritis    Atrial fibrillation (HCC)    CHF (congestive heart failure) (HCC)    Chronic kidney disease    Stage 5   Chronic kidney disease-mineral and bone disorder    Headache    HOH (hard of hearing)    Hyperlipidemia    Hypertension    Hypothyroidism    Peripheral vascular disease (HCC)    Proteinuria    Sleep apnea    wears CPAP 12   Type 2 diabetes mellitus with stage 5 chronic kidney disease ([place])    Wears glasses      Significant Hospital Events: Including procedures, antibiotic start and stop dates in addition to  other pertinent events   8/10: admitted to [facility] GI bleed  Interim History / Subjective:  See H&P  Objective   Blood pressure (!) 81/36, pulse 83, temperature 98.3 F (36.8 C), temperature source Oral, resp. rate 20, SpO2 98 %.       No intake or output data in the 24 hours ending 05/20/22 1649 There were no vitals filed for this visit.  Examination: General: Pale appearing male in NAD HEENT: MM pink/moist Neuro: Aox3; MAE CV: s1s2, rate 80s, no m/r/g PULM:  dim clear BS bilaterally GI: soft, bsx4 active; some pain in LLQ w/ palpation Extremities: warm/dry, B/l BKA; LUE fistula without erythema and palpable thrill Skin: no rashes or lesions appreciated   Resolved Hospital Problem list     Assessment & Plan:   Hemorrhagic shock GI bleed Hx of colonic diverticulitis and small bowel avm bleed P: -admit to ICU w/ continuous telemetry -No acute bleeding seen on CT abdomen/pelvis -Continue second unit of PRBC -Follow-up posttransfusion H/H -transfuse for hgb < 7 or hemodynamically unstable -GI consulted -check pt, ptt, inr -PPI infusion  Hypokalemia ESRD on dialysis M, W, F P: -replete k/mag -check mag -repeat bmp later tonight -nephrology consulted -Continue dialysis per nephro -Trend BMP / urinary output -Replace electrolytes as indicated -Avoid nephrotoxic agents, ensure adequate renal perfusion  A-fib:  Has been off Eliquis since recent diverticular bleed 2 weeks ago P: -Telemetry monitoring -currently rate controlled -will hold carvedilol in setting of hypotension  HFpEF HTN HLD P: -Daily weights; strict I/os -hold home nitro and carvedilol in setting of hypotension -hold statin while npo  Hx of hypothyroidism P: -hold home synthroid while npo  Hx of OSA P: -CPAP nightly  DMT2?  -no home meds listed P: -cbg monitoring -check a1c  GERD P: -PPI  PVD w/ b/l BKA P: -hold home ropinirole -mobilize in bed as able to avoid pressure  sores  Best Practice (right click and "Reselect all SmartList Selections" daily)   Diet/type: NPO DVT prophylaxis: otherGI bleed GI prophylaxis: PPI Lines: N/A Foley:  N/A Code Status:  full code Last date of multidisciplinary goals of care discussion [8/10 spoke with patient and wife at bedside]  Labs   CBC: Recent Labs  Lab 05/20/22 1313  WBC 4.9  HGB 5.9*  HCT 18.9*  MCV 99.0  PLT 865    Basic Metabolic Panel: Recent Labs  Lab 05/20/22 1313  NA 137  K 3.1*  CL 99  CO2 32  GLUCOSE 134*  BUN 14  CREATININE 2.35*  CALCIUM 7.8*   GFR: CrCl cannot be calculated (Unknown ideal weight.). Recent Labs  Lab 05/20/22 1313  WBC 4.9    Liver Function Tests: Recent Labs  Lab 05/20/22 1313  AST 13*  ALT 10  ALKPHOS 101  BILITOT 0.6  PROT 5.1*  ALBUMIN 2.3*   No results for input(s): "LIPASE", "AMYLASE" in the last 168 hours. No results for input(s): "AMMONIA" in the last 168 hours.  ABG    Component Value Date/Time   TCO2 32 05/23/2020 0758     Coagulation Profile: Recent Labs  Lab 05/20/22 1448  INR 1.4*    Cardiac Enzymes: No results for input(s): "CKTOTAL", "CKMB", "CKMBINDEX", "TROPONINI" in the last 168 hours.  HbA1C: Hgb A1c MFr Bld  Date/Time Value Ref Range Status  04/07/2020 09:18 AM 7.3 (H) 4.8 - 5.6 % Final    Comment:    (NOTE) Pre diabetes:          5.7%-6.4%  Diabetes:              >6.4%  Glycemic control for   <7.0% adults with diabetes   04/26/2019 05:43 AM 5.2 4.8 - 5.6 % Final    Comment:    (NOTE) Pre diabetes:          5.7%-6.4% Diabetes:              >6.4% Glycemic control for   <7.0% adults with diabetes     CBG: No results for input(s): "GLUCAP" in the last 168 hours.  Review of Systems:   Review of Systems  Constitutional:  Negative for fever.  Respiratory:  Negative for cough, hemoptysis, shortness of breath and wheezing.   Cardiovascular:  Negative for chest pain.  Gastrointestinal:  Positive for  blood in stool. Negative for melena, nausea and vomiting.     Past Medical History:  He,  has a past medical history of Anemia, Arthritis, Atrial fibrillation ([place]), CHF (congestive heart failure) ([place]), Chronic kidney disease, Chronic kidney disease-mineral and bone disorder, Headache, HOH (hard of hearing), Hyperlipidemia, Hypertension, Hypothyroidism, Peripheral vascular disease ([place]), Proteinuria, Sleep apnea, Type 2 diabetes mellitus with stage 5 chronic kidney disease ([place]), and Wears glasses.   Surgical History:   Past Surgical History:  Procedure Laterality Date   A/V FISTULAGRAM N/A 03/14/2020  Procedure: A/V FISTULAGRAM - left arm;  Surgeon: Elam Dutch, MD;  Location: [place] [facility];  Service: Cardiovascular;  Laterality: N/A;   ABDOMINAL AORTOGRAM W/LOWER EXTREMITY Bilateral 05/23/2020   Procedure: ABDOMINAL AORTOGRAM W/LOWER EXTREMITY;  Surgeon: Elam Dutch, MD;  Location: [place] [facility];  Service: Cardiovascular;  Laterality: Bilateral;  Bilateral    AV FISTULA PLACEMENT Left 01/01/2020   Procedure: LEFT ARM ARTERIOVENOUS (AV) FISTULA CREATION;  Surgeon: Angelia Mould, MD;  Location: [facility] OR;  Service: Vascular;  Laterality: Left;   CATARACT EXTRACTION W/ INTRAOCULAR LENS  IMPLANT, BILATERAL     COLONOSCOPY     COLONOSCOPY N/A 04/09/2020   Procedure: COLONOSCOPY;  Surgeon: [name], MD;  [facility];  Service: Endoscopy;  Laterality: N/A;   ESOPHAGOGASTRODUODENOSCOPY N/A 04/25/2019   Procedure: ESOPHAGOGASTRODUODENOSCOPY (EGD);  Surgeon: [name], MD;  [facility];  Service: Endoscopy;  Laterality: N/A;   ESOPHAGOGASTRODUODENOSCOPY N/A 04/08/2020   Procedure: ESOPHAGOGASTRODUODENOSCOPY (EGD);  Surgeon: [name], MD;  [facility];  Service: Endoscopy;  Laterality: N/A;   GIVENS CAPSULE STUDY N/A 04/16/2020   Procedure: GIVENS CAPSULE STUDY;  Surgeon: [name], MD;  [facility];   Service: Endoscopy;  Laterality: N/A;  730   HIP ARTHROSCOPY Left    INSERTION OF DIALYSIS CATHETER Right 01/01/2020   Procedure: INSERTION OF RIGHT INTERNAL JUGULAR DIALYSIS CATHETER;  Surgeon: Angelia Mould, MD;  Location: [facility] OR;  Service: Vascular;  Laterality: Right;   LIGATION OF COMPETING BRANCHES OF ARTERIOVENOUS FISTULA Left 03/28/2020   Procedure: SIDE BRANCH LIGATION TIMES THREE OF LEFT ARM  ARTERIOVENOUS FISTULA;  Surgeon: Marty Heck, MD;  Location: [facility];  Service: Vascular;  Laterality: Left;     Social History:   reports that he has been smoking cigars. He has been smoking an average of .25 packs per day. He has never used smokeless tobacco. He reports current alcohol use. He reports that he does not use drugs.   Family History:  His family history includes Lung cancer in his father.   Allergies Allergies  Allergen Reactions   Penicillins Rash and Other (See Comments)    Did it involve swelling of the face/tongue/throat, SOB, or low BP? No Did it involve sudden or severe rash/hives, skin peeling, or any reaction on the inside of your mouth or nose? Yes Did you need to seek medical attention at a hospital or doctor's office? Unknown When did it last happen?   60 years    If all above answers are "NO", may proceed with cephalosporin use.   Quinine     UNSPECIFIED REACTION      Home Medications  Prior to Admission medications   Medication Sig Start Date End Date Taking? Authorizing Provider  acetaminophen (TYLENOL) 650 MG CR tablet Take 1,300 mg by mouth in the morning and at bedtime.    [provider]  atorvastatin (LIPITOR) 40 MG tablet Take 40 mg by mouth at bedtime.     Fran Lowes, MD  carvedilol (COREG) 12.5 MG tablet Take 12.5 mg by mouth 2 (two) times daily. Does not take morning dose Tues Thurs and Sat before Dialysis 11/07/19   [provider]  docusate sodium (COLACE) 100 MG capsule Take 100 mg by mouth daily as needed  for mild constipation.    [provider]  levothyroxine (SYNTHROID) 25 MCG tablet Take 25 mcg by mouth daily before breakfast.  [provider]  Methoxy PEG-Epoetin Beta (MIRCERA IJ) Inject as directed as needed (Low hemoglobin).    [provider]  Multiple Vitamin (DAILY VITE PO) Take 800 mg by mouth daily. With Zinc    [provider]  nicotine (NICODERM CQ - DOSED IN MG/24 HOURS) 14 mg/24hr patch Place 1 patch (14 mg total) onto the skin daily. 04/10/20   Roxan Hockey, MD  nitroGLYCERIN (NITROSTAT) 0.4 MG SL tablet Place 0.4 mg under the tongue every 5 (five) minutes as needed for chest pain.    Fran Lowes, MD  ondansetron (ZOFRAN) 4 MG tablet Take 1 tablet (4 mg total) by mouth every 6 (six) hours as needed for nausea. 04/09/20   Roxan Hockey, MD  pantoprazole (PROTONIX) 40 MG tablet Take 40 mg by mouth at bedtime.     Fran Lowes, MD  rOPINIRole (REQUIP) 0.5 MG tablet Take 0.5 mg by mouth at bedtime.    [provider]  terazosin (HYTRIN) 1 MG capsule Take 1 mg by mouth at bedtime.    Fran Lowes, MD  XARELTO 15 MG TABS tablet Take 1 tablet (15 mg total) by mouth daily with supper. 04/10/20   Roxan Hockey, MD     Critical care time: 45 minutes    JD Geryl [facility] 05/20/2022, 4:49 PM  Please see [email or web address] for pager details.  From 7A-7P if no response, please call [phone]. After hours, please call ELink [phone].

## 2022-05-20 NOTE — ED Triage Notes (Signed)
Pt with continued problems with rectal bleeding. Did receive several units of blood at [place] recently.  Pt had hgb of 7.5 prior to dialysis yesterday  Pt extremely pale, poor cap refill.  Somnolent.  Charge nurse aware of need for room. Will keep in triage until roomed.

## 2022-05-21 ENCOUNTER — Encounter (HOSPITAL_COMMUNITY): Payer: Self-pay | Admitting: Pulmonary Disease

## 2022-05-21 ENCOUNTER — Inpatient Hospital Stay (HOSPITAL_COMMUNITY): Payer: Medicare Other

## 2022-05-21 DIAGNOSIS — J90 Pleural effusion, not elsewhere classified: Secondary | ICD-10-CM

## 2022-05-21 DIAGNOSIS — K5520 Angiodysplasia of colon without hemorrhage: Secondary | ICD-10-CM | POA: Diagnosis not present

## 2022-05-21 DIAGNOSIS — K5731 Diverticulosis of large intestine without perforation or abscess with bleeding: Secondary | ICD-10-CM | POA: Diagnosis not present

## 2022-05-21 DIAGNOSIS — K922 Gastrointestinal hemorrhage, unspecified: Secondary | ICD-10-CM | POA: Diagnosis not present

## 2022-05-21 DIAGNOSIS — D519 Vitamin B12 deficiency anemia, unspecified: Secondary | ICD-10-CM

## 2022-05-21 DIAGNOSIS — D649 Anemia, unspecified: Secondary | ICD-10-CM

## 2022-05-21 LAB — CBC
HCT: 26.3 % — ABNORMAL LOW (ref 39.0–52.0)
HCT: 24.6 % — ABNORMAL LOW (ref 39.0–52.0)
HCT: 25.8 % — ABNORMAL LOW (ref 39.0–52.0)
HCT: 26.1 % — ABNORMAL LOW (ref 39.0–52.0)
HCT: 26.8 % — ABNORMAL LOW (ref 39.0–52.0)
Hemoglobin: 8.6 g/dL — ABNORMAL LOW (ref 13.0–17.0)
Hemoglobin: 8.3 g/dL — ABNORMAL LOW (ref 13.0–17.0)
Hemoglobin: 8.5 g/dL — ABNORMAL LOW (ref 13.0–17.0)
Hemoglobin: 8.5 g/dL — ABNORMAL LOW (ref 13.0–17.0)
Hemoglobin: 8.7 g/dL — ABNORMAL LOW (ref 13.0–17.0)
MCH: 30.7 pg (ref 26.0–34.0)
MCH: 31.3 pg (ref 26.0–34.0)
MCH: 31.1 pg (ref 26.0–34.0)
MCH: 30.4 pg (ref 26.0–34.0)
MCH: 30.7 pg (ref 26.0–34.0)
MCHC: 32.7 g/dL (ref 30.0–36.0)
MCHC: 33.7 g/dL (ref 30.0–36.0)
MCHC: 32.9 g/dL (ref 30.0–36.0)
MCHC: 32.6 g/dL (ref 30.0–36.0)
MCHC: 32.5 g/dL (ref 30.0–36.0)
MCV: 93.9 fL (ref 80.0–100.0)
MCV: 92.8 fL (ref 80.0–100.0)
MCV: 94.5 fL (ref 80.0–100.0)
MCV: 93.2 fL (ref 80.0–100.0)
MCV: 94.7 fL (ref 80.0–100.0)
Platelets: 113 10*3/uL — ABNORMAL LOW (ref 150–400)
Platelets: 108 10*3/uL — ABNORMAL LOW (ref 150–400)
Platelets: 127 10*3/uL — ABNORMAL LOW (ref 150–400)
Platelets: 131 10*3/uL — ABNORMAL LOW (ref 150–400)
Platelets: 138 10*3/uL — ABNORMAL LOW (ref 150–400)
RBC: 2.8 MIL/uL — ABNORMAL LOW (ref 4.22–5.81)
RBC: 2.65 MIL/uL — ABNORMAL LOW (ref 4.22–5.81)
RBC: 2.73 MIL/uL — ABNORMAL LOW (ref 4.22–5.81)
RBC: 2.8 MIL/uL — ABNORMAL LOW (ref 4.22–5.81)
RBC: 2.83 MIL/uL — ABNORMAL LOW (ref 4.22–5.81)
RDW: 17.4 % — ABNORMAL HIGH (ref 11.5–15.5)
RDW: 17.5 % — ABNORMAL HIGH (ref 11.5–15.5)
RDW: 17.5 % — ABNORMAL HIGH (ref 11.5–15.5)
RDW: 17.3 % — ABNORMAL HIGH (ref 11.5–15.5)
RDW: 17.2 % — ABNORMAL HIGH (ref 11.5–15.5)
WBC: 5.8 10*3/uL (ref 4.0–10.5)
WBC: 5.4 10*3/uL (ref 4.0–10.5)
WBC: 6.1 10*3/uL (ref 4.0–10.5)
WBC: 6.7 10*3/uL (ref 4.0–10.5)
WBC: 6.4 10*3/uL (ref 4.0–10.5)
nRBC: 0 % (ref 0.0–0.2)
nRBC: 0 % (ref 0.0–0.2)
nRBC: 0 % (ref 0.0–0.2)
nRBC: 0 % (ref 0.0–0.2)
nRBC: 0 % (ref 0.0–0.2)

## 2022-05-21 LAB — BASIC METABOLIC PANEL
Anion gap: 9 (ref 5–15)
BUN: 18 mg/dL (ref 8–23)
CO2: 27 mmol/L (ref 22–32)
Calcium: 7.7 mg/dL — ABNORMAL LOW (ref 8.9–10.3)
Chloride: 99 mmol/L (ref 98–111)
Creatinine, Ser: 2.79 mg/dL — ABNORMAL HIGH (ref 0.61–1.24)
GFR, Estimated: 22 mL/min — ABNORMAL LOW (ref >60)
Glucose, Bld: 107 mg/dL — ABNORMAL HIGH (ref 70–99)
Potassium: 3.8 mmol/L (ref 3.5–5.1)
Sodium: 135 mmol/L (ref 135–145)

## 2022-05-21 LAB — GLUCOSE, CAPILLARY
Glucose-Capillary: 103 mg/dL — ABNORMAL HIGH (ref 70–99)
Glucose-Capillary: 83 mg/dL (ref 70–99)
Glucose-Capillary: 76 mg/dL (ref 70–99)
Glucose-Capillary: 71 mg/dL (ref 70–99)
Glucose-Capillary: 165 mg/dL — ABNORMAL HIGH (ref 70–99)
Glucose-Capillary: 122 mg/dL — ABNORMAL HIGH (ref 70–99)

## 2022-05-21 LAB — HEPATITIS B CORE ANTIBODY, TOTAL: Hep B Core Total Ab: NONREACTIVE

## 2022-05-21 LAB — HEPATITIS B SURFACE ANTIBODY,QUALITATIVE: Hep B S Ab: NONREACTIVE

## 2022-05-21 LAB — HEPATITIS B SURFACE ANTIGEN: Hepatitis B Surface Ag: NONREACTIVE

## 2022-05-21 LAB — HEPATITIS C ANTIBODY: HCV Ab: NONREACTIVE

## 2022-05-21 LAB — APTT: aPTT: 33 seconds (ref 24–36)

## 2022-05-21 LAB — MAGNESIUM: Magnesium: 2.2 mg/dL (ref 1.7–2.4)

## 2022-05-21 MED ORDER — ORAL CARE MOUTH RINSE: 15.0000 mL | OROMUCOSAL | Status: DC | PRN

## 2022-05-21 MED ORDER — PEG-KCL-NACL-NASULF-NA ASC-C 100 G PO SOLR: 1.0000 | Freq: Once | ORAL | Status: DC

## 2022-05-21 MED ORDER — PEG-KCL-NACL-NASULF-NA ASC-C 100 G PO SOLR
0.5000 | Freq: Once | ORAL | Status: AC
  Administered 2022-05-21: 100 g via ORAL
  Filled 2022-05-21: qty 1

## 2022-05-21 MED ORDER — CHLORHEXIDINE GLUCONATE CLOTH 2 % EX PADS
6.0000 | MEDICATED_PAD | Freq: Every day | CUTANEOUS | Status: DC
  Administered 2022-05-21 – 2022-05-29 (×5): 6 via TOPICAL

## 2022-05-21 MED ORDER — PANTOPRAZOLE SODIUM 40 MG IV SOLR
40.0000 mg | Freq: Two times a day (BID) | INTRAVENOUS | Status: DC
  Administered 2022-05-21 – 2022-05-22 (×4): 40 mg via INTRAVENOUS
  Filled 2022-05-21 (×4): qty 10

## 2022-05-21 MED ORDER — INSULIN ASPART 100 UNIT/ML IJ SOLN
0.0000 [IU] | INTRAMUSCULAR | Status: DC
  Administered 2022-05-21 – 2022-05-22 (×2): 1 [IU] via SUBCUTANEOUS

## 2022-05-21 MED ORDER — PEG-KCL-NACL-NASULF-NA ASC-C 100 G PO SOLR
0.5000 | Freq: Once | ORAL | Status: AC
  Administered 2022-05-22: 100 g via ORAL
  Filled 2022-05-21: qty 1

## 2022-05-21 MED ORDER — SODIUM CHLORIDE 0.9 % IV SOLN: INTRAVENOUS | Status: DC

## 2022-05-21 MED ORDER — SODIUM CHLORIDE 0.9% FLUSH
3.0000 mL | Freq: Two times a day (BID) | INTRAVENOUS | Status: DC
  Administered 2022-05-21 – 2022-06-01 (×15): 3 mL via INTRAVENOUS

## 2022-05-21 MED ORDER — CYANOCOBALAMIN 1000 MCG/ML IJ SOLN
1000.0000 ug | INTRAMUSCULAR | Status: DC
  Administered 2022-05-21 – 2022-05-28 (×2): 1000 ug via INTRAMUSCULAR
  Filled 2022-05-21 (×3): qty 1

## 2022-05-21 MED ORDER — CHLORHEXIDINE GLUCONATE CLOTH 2 % EX PADS
6.0000 | MEDICATED_PAD | Freq: Every day | CUTANEOUS | Status: DC
  Administered 2022-05-21 – 2022-05-28 (×6): 6 via TOPICAL

## 2022-05-21 MED ORDER — DEXTROSE 50 % IV SOLN
0.5000 | Freq: Once | INTRAVENOUS | Status: AC
  Administered 2022-05-21: 25 mL via INTRAVENOUS
  Filled 2022-05-21: qty 50

## 2022-05-21 NOTE — Progress Notes (Signed)
NAME:  Terry Stadler., MRN:  [id number], DOB:  16-Jul-1935, LOS: 0 ADMISSION DATE:  05/20/2022, CONSULTATION DATE:  8/10 REFERRING MD:  Dr. Billy Fischer, CHIEF COMPLAINT:  GI bleed   History of Present Illness:  Patient is a 87 year old male with pertinent PMH of ESRD (dialysis M, W, F), hypothyroidism, GERD, HFpEF, chronic A-fib on Xarelto (off Xarelto since 7/26), OSA on CPAP, recent GI bleed presents to [facility] ED on 8/10 with GI bleed.  Patient was recently admitted to [facility] on 7/26.  Noted to have colonic diverticulitis and small bowel AVM bleed.  Home Xarelto was held.  Patient given PRBC for low hemoglobin.  Patient was discharged about a week ago and to follow-up with GI outpatient.  Since discharge patient has still had persistent bleeding with BM about a cup of blood per day.  States he really has not had no hematemesis,, maybe once.  No heavy EtOH abuse.  Denies fevers, SOB, chest pain.  Does have some elbow pain and LLQ with bowel movements.   Patient presents to [facility] on 8/10 with GI bleed.  Patient mildly hypotensive,, otherwise hemodynamically stable.  Afebrile.  On room air.  Initial Hgb 5.9.  Given 2 units PRBCs.  Patient's MAP less than 65 despite PRBCs. K 3.1, creat 2.35 (close to baseline), WBC 4.9.  PCCM consulted for ICU admission.  Pertinent  Medical History   Past Medical History:  Diagnosis Date   Anemia    Arthritis    Atrial fibrillation (HCC)    CHF (congestive heart failure) (HCC)    Chronic kidney disease    Stage 5   Chronic kidney disease-mineral and bone disorder    Headache    HOH (hard of hearing)    Hyperlipidemia    Hypertension    Hypothyroidism    Peripheral vascular disease (HCC)    Proteinuria    Sleep apnea    wears CPAP 12   Type 2 diabetes mellitus with stage 5 chronic kidney disease ([place])    Wears glasses     Significant Hospital Events: Including procedures, antibiotic start and stop dates in addition to other  pertinent events   8/10 Admitted to [facility] ICU and transfused 2 units  Interim History / Subjective:  Bloody BM mid morning 8/11. No other complaints  Objective   Blood pressure (!) 98/37, pulse 61, temperature 97.9 F (36.6 C), temperature source Axillary, resp. rate 16, weight 73.1 kg, SpO2 100 %.        Intake/Output Summary (Last 24 hours) at 05/21/2022 0728 Last data filed at 05/21/2022 0600 Gross per 24 hour  Intake 1469.31 ml  Output --  Net 1469.31 ml   Filed Weights   05/20/22 1900 05/21/22 0500  Weight: 72.9 kg 73.1 kg    Examination: General: Pale appearing male in NAD HEENT: MM pink/moist Neuro: Aox3; MAE CV: s1s2, rate 80s, no m/r/g PULM:  dim clear BS bilaterally GI: soft, bsx4 active; some pain in LLQ w/ palpation Extremities: warm/dry, B/l BKA; LUE fistula without erythema and palpable thrill Skin: no rashes or lesions appreciated  Resolved Hospital Problem list     Assessment & Plan:   Hemorrhagic shock due to GIB GI bleed - hematochezia Blood loss anemia Hx colonic diverticulitis and SB AVM Colonoscopy 7/26 that reportedly showed diverticular bleeding and possible small bowel AVM. anemia is likely multifactorial since the patient probably has low erythropoietin production due to ESRD and anemia of chronic disease CT 8/10 negative for  acute GI hemorrhage. S/p 2 units PRBC - CBC q8 - transfuse hgb <7 - PPI - GI consulted  ESRD HD MWF Hypokalemia - nephrology consulted for inpatient HD initiation - strict I+Os, daily weights -Continue dialysis per nephro -Trend BMP / urinary output -Replace electrolytes as indicated -Avoid nephrotoxic agents, ensure adequate renal perfusion  HFrEF HTN - holding home antihypertensives -Daily weights; strict I/os - ECHO  Afib No recent Eliquis use -Telemetry monitoring -currently rate controlled. BB being held due to hypotension  Lung nodules Pleural effusions 2.3 cm left lung nodule and complex left  pleural effusion. 2.6 cm mass RUL nad 3.0 cm mass lingula.  - consider diagnostic thoracentesis  OSA - CPAP QHS  DM - CBG monitoring - sensitive SSI  Hypothyroidism -hold home synthroid while npo - restart once taking PO again  HLD  GERD - PPI  PVD hx BL BKA -hold home ropinirole -mobilize in bed as able to avoid pressure sores  L2, L4 compression fractures  Best Practice (right click and "Reselect all SmartList Selections" daily)   Diet/type: NPO DVT prophylaxis: not indicated GI prophylaxis: PPI Lines: N/A Foley:  N/A Code Status:  full code Last date of multidisciplinary goals of care discussion []   Critical care time: 40

## 2022-05-21 NOTE — Progress Notes (Signed)
Patient noted to have large bloody BM this afternoon around 2PM. Volume not recorded but estimated to fill 1/2 bedpan.   BP noted to be 104/56 and HR 73, unchanged compared to before BM.  Patient was awake and alert.  CBC checked and Hgb stable.  Patient overall appeared to be stable. No further intervention pursed at that time. Patient is planned to colonoscopy and possible EGD with GI tomorrow. He can have CLD today and NPO at midnight. Bowel prep tonight.

## 2022-05-21 NOTE — TOC Progression Note (Signed)
Transition of Care (TOC) - Progression Note    Patient Details  Name: Terry Graves. MRN: [id number] Date of Birth: Mar 12, 1935  Transition of Care [facility]) CM/SW Contact  Angelita Ingles, RN Phone Number:[phone]  05/21/2022, 1:33 PM  Clinical Narrative:     Transition of Care [facility]) Screening Note   Patient Details  Name: Terry Graves. Date of Birth: July 01, 1935   Transition of Care [facility]) CM/SW Contact:    Angelita Ingles, RN Phone Number: 05/21/2022, 1:33 PM    Transition of Care Department [facility]) has reviewed patient and no TOC needs have been identified at this time. We will continue to monitor patient advancement through interdisciplinary progression rounds.           Expected Discharge Plan and Services                                                 Social Determinants of Health (SDOH) Interventions    Readmission Risk Interventions     No data to display

## 2022-05-21 NOTE — Progress Notes (Signed)
Pt placed on CPAP w/ 3L bleed in and resting comfortably. RT will cont to monitor as needed.

## 2022-05-21 NOTE — Progress Notes (Signed)
Pt receives out-pt HD at [facility] on MWF. Pt has a 12:30 chair time. Will assist as needed.   Melven Sartorius Renal Navigator [phone]

## 2022-05-21 NOTE — Progress Notes (Signed)
Family brought ipad and Pensions consultant for patient. Those items are now at the bedside.

## 2022-05-21 NOTE — Consult Note (Signed)
Renal Service Consult Note [facility]  Terry Graves. 05/21/2022 Terry Blazing, MD Requesting Physician: Dr. Loanne Terry Graves  Reason for Consult: ESRD pt w/ GI bleed HPI: The patient is a 87 y.o. year-old w/ hx of anemia, atrial fib, ESRD on HD, HOH, HL, HTN, PAD, OSA, DM2, bilat LE amputee who presented w/ recurrent GI bleeding. Had recent dx of AVM bleed from small bowel at hospital in [place]. Home xarelto was held. Pt was dc'd about 1 wk ago. Now presented w/ having bleeding w/ about a cup of blood per day per rectum. In ED on 8/10 BP's were soft, afeb, on RA, Hb 5.9 and 2u prbc's were given. K 3.1. creat 2.3, WBC 4. CCM team admitted pt to ICU. We are asked to see for ESRD.    Pt seen in ICU. Pt alert in no distress, calm and conversant. Pt was getting HD in [place] (recommended per his kidney doctor in [place] when he started dialysis). A few wks ago he was moved to HD unit in [place] which is where he lives. He has 2 dtrs that are taking turns taking him to HD. He is working on signing up for public transportation. Pt lives w/ his dtr. Not on home O2. No cough, CP or SOB.  WC bound.   ROS - denies CP, no joint pain, no HA, no blurry vision, no rash, no diarrhea, no nausea/ vomiting   Past Medical History  Past Medical History:  Diagnosis Date   Anemia    Arthritis    Atrial fibrillation (HCC)    CHF (congestive heart failure) (HCC)    Chronic kidney disease    Stage 5   Chronic kidney disease-mineral and bone disorder    Headache    HOH (hard of hearing)    Hyperlipidemia    Hypertension    Hypothyroidism    Peripheral vascular disease (HCC)    Proteinuria    Sleep apnea    wears CPAP 12   Type 2 diabetes mellitus with stage 5 chronic kidney disease ([place])    Wears glasses    Past Surgical History  Past Surgical History:  Procedure Laterality Date   A/V FISTULAGRAM N/A 03/14/2020   Procedure: A/V FISTULAGRAM - left arm;  Surgeon: Terry Dutch, MD;  Location: [facility];  Service: Cardiovascular;  Laterality: N/A;   ABDOMINAL AORTOGRAM W/LOWER EXTREMITY Bilateral 05/23/2020   Procedure: ABDOMINAL AORTOGRAM W/LOWER EXTREMITY;  Surgeon: Terry Dutch, MD;  Location: [facility];  Service: Cardiovascular;  Laterality: Bilateral;  Bilateral    AV FISTULA PLACEMENT Left 01/01/2020   Procedure: LEFT ARM ARTERIOVENOUS (AV) FISTULA CREATION;  Surgeon: Terry Mould, MD;  Location: [facility] OR;  Service: Vascular;  Laterality: Left;   CATARACT EXTRACTION W/ INTRAOCULAR LENS  IMPLANT, BILATERAL     COLONOSCOPY     COLONOSCOPY N/A 04/09/2020   Procedure: COLONOSCOPY;  Surgeon: Terry Houston, MD;  [facility];  Service: Endoscopy;  Laterality: N/A;   ESOPHAGOGASTRODUODENOSCOPY N/A 04/25/2019   Procedure: ESOPHAGOGASTRODUODENOSCOPY (EGD);  Surgeon: Terry Houston, MD;  [facility];  Service: Endoscopy;  Laterality: N/A;   ESOPHAGOGASTRODUODENOSCOPY N/A 04/08/2020   Procedure: ESOPHAGOGASTRODUODENOSCOPY (EGD);  Surgeon: Terry Houston, MD;  [facility];  Service: Endoscopy;  Laterality: N/A;   GIVENS CAPSULE STUDY N/A 04/16/2020   Procedure: GIVENS CAPSULE STUDY;  Surgeon: Terry Houston, MD;  [facility];  Service: Endoscopy;  Laterality: N/A;  730   HIP ARTHROSCOPY Left    INSERTION OF DIALYSIS CATHETER Right 01/01/2020   Procedure: INSERTION OF RIGHT INTERNAL JUGULAR DIALYSIS CATHETER;  Surgeon: Terry Mould, MD;  Location: [facility] OR;  Service: Vascular;  Laterality: Right;   LIGATION OF COMPETING BRANCHES OF ARTERIOVENOUS FISTULA Left 03/28/2020   Procedure: SIDE BRANCH LIGATION TIMES THREE OF LEFT ARM  ARTERIOVENOUS FISTULA;  Surgeon: Terry Heck, MD;  Location: [facility] OR;  Service: Vascular;  Laterality: Left;   Family History  Family History  Problem Relation Age of Onset   Lung cancer Father    Social History  reports that he has been smoking  cigars. He has been smoking an average of .25 packs per day. He has never used smokeless tobacco. He reports current alcohol use. He reports that he does not use drugs. Allergies  Allergies  Allergen Reactions   Penicillins Rash and Other (See Comments)    Did it involve swelling of the face/tongue/throat, SOB, or low BP? No Did it involve sudden or severe rash/hives, skin peeling, or any reaction on the inside of your mouth or nose? Yes Did you need to seek medical attention at a hospital or doctor's office? Unknown When did it last happen?   60 years    If all above answers are "NO", may proceed with cephalosporin use.   Quinine     UNSPECIFIED REACTION    Home medications Prior to Admission medications   Medication Sig Start Date End Date Taking? Authorizing Provider  atorvastatin (LIPITOR) 40 MG tablet Take 40 mg by mouth at bedtime.    Yes Terry Lowes, MD  carvedilol (COREG) 12.5 MG tablet Take 12.5 mg by mouth See admin instructions. 12.5 mg twice daily. HOLD on dialysis days M-W-F 11/07/19  Yes [provider]  Cholecalciferol (VITAMIN D3) 125 MCG (5000 UT) CAPS Take 5,000 Units by mouth every evening. 03/11/22  Yes [provider]  levothyroxine (SYNTHROID) 25 MCG tablet Take 25 mcg by mouth daily before breakfast.   Yes [provider]  Methoxy PEG-Epoetin Beta (MIRCERA IJ) Inject as directed as needed (Low hemoglobin).   Yes [provider]  Multiple Vitamin (DAILY VITE PO) Take 1 tablet by mouth every evening.   Yes [provider]  pantoprazole (PROTONIX) 40 MG tablet Take 40 mg by mouth at bedtime.    Yes Terry Lowes, MD  rOPINIRole (REQUIP) 0.5 MG tablet Take 0.5 mg by mouth at bedtime.   Yes [provider]  terazosin (HYTRIN) 1 MG capsule Take 1 mg by mouth at bedtime.   Yes Terry Lowes, MD  XARELTO 15 MG TABS tablet Take 1 tablet (15 mg total) by mouth daily with supper. Patient not taking: Reported on  05/21/2022 04/10/20   Terry Hockey, MD     Vitals:   05/21/22 1130 05/21/22 1140 05/21/22 1200 05/21/22 1230  BP: (!) 106/50  (!) 105/36 (!) 104/50  Pulse: 74  70 86  Resp: 14  14 17   Temp:  97.6 F (36.4 C)    TempSrc:  Oral    SpO2: 96%  97% 92%  Weight:       Exam Gen alert, no distress No rash, cyanosis or gangrene Sclera anicteric, throat clear  No jvd or bruits Chest clear bilat to bases, no rales/ wheezing RRR no MRG Abd soft ntnd no mass or ascites +bs GU normal male MS R AKA and L BKA Ext no LE or UE edema, no wounds  or ulcers Neuro is alert, Ox 3 , nf    LUA AVF+bruit   Home meds include - atrovastatin, carvedilol 12.5 bid, levothyroxine, MVI, pantoprazole, ropinirole, terazosin, xarelto, prns/ vits / supps     OP HD: MWF [place] - last hep B labs: pending - need records   Assessment/ Plan: GI bleed - hx recent SB AVM bleed. GI consulted. PPI infusion.  Shock - due to hemorrhage as above, not on pressors.  ESRD - on HD MWF in [place]. Have placed orders for HD sometime today/ tonight, or possibly tomorrow at the latest. Pt is stable from renal standpoint.  Volume - no vol excess on exam, on room air, no edema. Will try to get OP records Anemia esrd - Hb 5.9 on admission, up to 8.6 after 2u prbcs. Follow.  MBD ckd - CCa in range, will add on phos.  Atrial fib - holding blood thinners d/t #1 HTN - bp's soft, holding home coreg PAD / bilat LE amputee      Rob Danika Kluender  MD 05/21/2022, 1:00 PM Recent Labs  Lab 05/20/22 1313 05/20/22 2105 05/21/22 0217 05/21/22 0601  HGB 5.9*   < > 8.6* 8.3*  ALBUMIN 2.3*  --   --   --   CALCIUM 7.8*  --  7.7*  --   CREATININE 2.35*  --  2.79*  --   K 3.1*  --  3.8  --    < > = values in this interval not displayed.

## 2022-05-21 NOTE — H&P (View-Only) (Signed)
[facility] ROUNDING NOTE   Subjective: Patient reports another episode of hematochezia overnight and again this afternoon.  Minimal lower abdominal cramping, but otherwise no frank abdominal pain.  No nausea/vomiting.  Unable to obtain colonoscopy report from [facility].  Patient states that there was no EGD done during the hospitalization.   Objective: Vital signs in last 24 hours: Temp:  [96.9 F (36.1 C)-98 F (36.7 C)] 98 F (36.7 C) (08/11 1600) Pulse Rate:  [61-93] 72 (08/11 1630) Resp:  [13-22] 14 (08/11 1630) BP: (63-119)/(35-76) 98/49 (08/11 1630) SpO2:  [83 %-100 %] 100 % (08/11 1630) Weight:  [72.9 kg-73.1 kg] 73.1 kg (08/11 0500) Last BM Date : 05/21/22 General: NAD Lungs:  CTA b/l, no w/r/r Heart:  RRR, no m/r/g Abdomen:  Soft, NT, ND, +BS    Intake/Output from previous day: 08/10 0701 - 08/11 0700 In: 1469.3 [I.V.:374.5; Blood:538.3; IV Piggyback:556.4] Out: -  Intake/Output this shift: Total I/O In: 132.2 [I.V.:132.2] Out: 0    Lab Results: Recent Labs    05/21/22 0601 05/21/22 1248 05/21/22 1417  WBC 5.4 6.1 6.7  HGB 8.3* 8.5* 8.5*  PLT 108* 127* 131*  MCV 92.8 94.5 93.2   BMET Recent Labs    05/20/22 1313 05/21/22 0217  NA 137 135  K 3.1* 3.8  CL 99 99  CO2 32 27  GLUCOSE 134* 107*  BUN 14 18  CREATININE 2.35* 2.79*  CALCIUM 7.8* 7.7*   LFT Recent Labs    05/20/22 1313  PROT 5.1*  ALBUMIN 2.3*  AST 13*  ALT 10  ALKPHOS 101  BILITOT 0.6   PT/INR Recent Labs    05/20/22 1448  INR 1.4*      Imaging/Other results: CT CHEST WO CONTRAST  Result Date: 05/20/2022 CLINICAL DATA:  Lung nodule, > 7mm Left lung nodule EXAM: CT CHEST WITHOUT CONTRAST TECHNIQUE: Multidetector CT imaging of the chest was performed following the standard protocol without IV contrast. RADIATION DOSE REDUCTION: This exam was performed according to the departmental dose-optimization program which includes automated exposure  control, adjustment of the mA and/or kV according to patient size and/or use of iterative reconstruction technique. COMPARISON:  None Available. FINDINGS: Cardiovascular: Extensive multi-vessel coronary artery calcification. Global cardiac size is within normal limits. No pericardial effusion. Central pulmonary arteries are enlarged in keeping with changes of pulmonary arterial hypertension. Extensive atherosclerotic calcification within the thoracic aorta. Mild dilation of the ascending thoracic aorta which measures 4.0 cm in greatest dimension. Descending thoracic aorta is of normal caliber. Vascular stent is partially visualized within the left cephalic arch. Mediastinum/Nodes: Visualized thyroid is unremarkable. No pathologic thoracic adenopathy. Esophagus is unremarkable. Lungs/Pleura: Large right pleural effusion is present with compressive atelectasis of the dependent right lung. There is a superimposed 2.6 x 1.9 cm mass identified within the right upper lobe which is difficult to accurately measure on this noncontrast examination with adjacent atelectatic lung. On the left, a complex moderate left pleural effusion is again identified within the posterior basal left hemithorax with associated visceral and parietal pleural thickening and complex internal architecture which may relate to hemorrhagic or proteinaceous fluid. There is associated subtotal collapse of the left lower lobe. Within the aerated lingula, a 1.7 x 3.0 cm rounded mass is again identified. This may represent rounded atelectasis, however, underlying neoplastic mass is not excluded. No pneumothorax. No central obstructing lesion. Upper Abdomen: No acute abnormality within the visualized upper abdomen. This is better assessed on CTA examination performed earlier. Musculoskeletal: No acute bone  abnormality. No lytic or blastic bone lesion. Multiple healed left rib fractures are noted. The osseous structures are diffusely osteopenic. IMPRESSION:  1. Large right pleural effusion with associated compressive atelectasis of the dependent right lung. 2. Moderate complex left pleural effusion with associated pleural thickening and complex internal architecture which may relate to hemorrhagic or proteinaceous fluid. Super infection, as can be seen with empyema, is not excluded and correlation with clinical laboratory examination is recommended. Diagnostic thoracentesis may be helpful for further evaluation. Associated subtotal collapse of the left lower lobe. 3. Superimposed 2.6 cm mass within the right upper lobe and 3.0 cm mass within the aerated lingula. In absence of prior examinations to determine acuity, PET CT examination is recommended for further evaluation, if clinically indicated. 4. Extensive multi-vessel coronary artery calcification. 5. Mild dilation of the ascending thoracic aorta which measures 4.0 cm in greatest dimension. Recommend annual imaging followup by CTA or MRA. This recommendation follows 2010 ACCF/AHA/AATS/ACR/ASA/SCA/SCAI/SIR/STS/SVM Guidelines for the Diagnosis and Management of Patients with Thoracic Aortic Disease. Circulation. 2010; 121: H829-H371. Aortic aneurysm NOS (ICD10-I71.9) 6. Enlarged central pulmonary arteries in keeping with changes of pulmonary arterial hypertension. Aortic Atherosclerosis (ICD10-I70.0). Electronically Signed   By: Fidela Salisbury M.D.   On: 05/20/2022 22:58   CT Angio Abd/Pel W and/or Wo Contrast  Result Date: 05/20/2022 CLINICAL DATA:  87 year old male with a history of lower GI bleeding EXAM: CTA ABDOMEN AND PELVIS WITHOUT AND WITH CONTRAST TECHNIQUE: Multidetector CT imaging of the abdomen and pelvis was performed using the standard protocol during bolus administration of intravenous contrast. Multiplanar reconstructed images and MIPs were obtained and reviewed to evaluate the vascular anatomy. RADIATION DOSE REDUCTION: This exam was performed according to the departmental dose-optimization  program which includes automated exposure control, adjustment of the mA and/or kV according to patient size and/or use of iterative reconstruction technique. CONTRAST:  111mL OMNIPAQUE IOHEXOL 350 MG/ML SOLN COMPARISON:  CT 04/24/2019 FINDINGS: VASCULAR Aorta: Mild atherosclerotic changes of the lower thoracic aorta and the abdominal aorta. No pedunculated plaque, ulcerated plaque, dissection. No aneurysm. No periaortic fluid or inflammatory changes. Celiac: Celiac artery patent.  Branches patent. SMA: SMA patent with mild atherosclerosis at the origin and estimated less than 50% narrowing. Branches are patent. Renals: - Right: Single right renal artery. Atherosclerotic changes at the origin. Estimated 50% narrowing secondary to calcified plaque. - Left: Single left renal artery. Atherosclerotic changes at the origin with at least 50% narrowing secondary to calcified plaque. IMA: Inferior mesenteric artery is patent. Right lower extremity: Mild tortuosity of the right iliac system. Mild atherosclerotic changes without evidence of high-grade stenosis of the common iliac artery or external iliac artery. Hypogastric artery is patent. Pelvic arteries patent. Common femoral artery patent with mild atherosclerosis. Mixed calcified and soft plaque in the proximal right superficial femoral artery, which appears occluded after the origin. Profunda femoris patent. Left lower extremity: Tortuosity of the left iliac system. Mild atherosclerosis without high-grade stenosis or occlusion of the common iliac artery and external iliac artery. Hypogastric artery is patent as well as pelvic arteries. Common femoral artery patent with mild atherosclerosis. Proximal profunda femoris and SFA patent. Veins: Unremarkable appearance of the venous system. Review of the MIP images confirms the above findings. NON-VASCULAR Lower chest: Bilateral pleural effusions at the lung bases. Thickening of the left-sided visceral and parietal pleura, new  from the prior of 04/24/2019. Nodule at the left lung base measures 2.3 cm, new from the comparison. Atelectasis bilateral lung bases. Hepatobiliary: Unremarkable appearance of  the liver. Calcified stones within the gallbladder lumen. Pancreas: Rounded hypodense lesion within the pancreas body, levin mm, new from the comparison. No inflammatory changes of pancreas. Spleen: Unremarkable. Adrenals/Urinary Tract: - Right adrenal gland: Unremarkable - Left adrenal gland: Unremarkable. - Right kidney: No hydronephrosis. Small nonobstructing stones in the calyceal tips. Unremarkable right ureter. - Left Kidney: No hydronephrosis. Nonobstructing stones at the calyceal tips. Unremarkable course of the left ureter. - Urinary Bladder: Bladder is contracted. Stomach/Bowel: - Stomach: Unremarkable. - Small bowel: Small bowel decompressed with no transition point. No inflammatory changes. No puddling of contrast. - Appendix: Normal. - Colon: Colon decompressed with no transition point. No puddling of contrast. No wall thickening. No inflammatory changes. Left-sided diverticular disease. No transition point. Lymphatic: No adenopathy. Mesenteric: Mild edema within the mesentery. No free fluid or significant ascites. Reproductive: Unremarkable Other: Body wall edema/anasarca Musculoskeletal: Osteopenia. New compression fractures of L2 and L4. No significant height loss at L2. Approximately 50% height loss at L4. No significant retropulsion of the posterior endplates. Progression of the L1 compression fracture with approximately 50% height loss, progressed from the comparison. No bony canal narrowing. Degenerative changes of the hips. IMPRESSION: CT angiogram negative for evidence of acute gastrointestinal hemorrhage. Bilateral pleural effusions. The left side demonstrates pleural thickening of the visceral and parietal pleura, and empyema cannot be excluded. Given this finding as well as the presence of an indeterminate 2.3 cm lung  nodule on the left, complete chest CT imaging is recommended. Mild mesenteric and body wall edema/anasarca, suggesting fluid positive state. No overt pulmonary edema or ascites. Multilevel peripheral arterial disease, including mild aortic atherosclerosis, bilateral iliac atherosclerosis, and proximal femoral arterial disease in this patient with history of hemodialysis. Likely proximal right SFA occlusion. Aortic Atherosclerosis (ICD10-I70.0). 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 New compression fractures of L2 and L4, as above. There has been progression of prior L1 compression fracture. If symptomatic, further evaluation with MR lumbar spine may be considered to determine acuity/chronicity. Additional ancillary findings as above. Signed, Dulcy Fanny. Nadene Rubins, RPVI [facility] Radiology Electronically Signed   By: Corrie Mckusick D.O.   On: 05/20/2022 16:32      Assessment and Plan:  1) Hematochezia 2) Acute blood loss anemia 3) Diverticulosis 4) History of small bowel AVM - Xarelto held on admission - CT angio on admission was negative for active bleeding, but certainly still with continued bleeding clinically.  Good response to 2 unit PRBC transfusion  - Continue serial CBC checks with additional blood products as needed per protocol - Plan for colonoscopy tomorrow for diagnostic and therapeutic intent - If colonoscopy unrevealing, plan for EGD - Okay for clears for now with n.p.o. at midnight - Ordered bowel prep for procedures tomorrow - Consent signed at bedside with patient  6) ESRD on HD - HD per Nephrology service  7) Atrial fibrillation 8) Chronic anticoagulation -Xarelto held on admission -BP and rate management per primary CCS   9) B12 deficiency 10) Anemia of chronic disease (acute on chronic anemia) - Multifactorial anemia - B12 supplement  The indications,  risks, and benefits of EGD and colonoscopy were explained to the patient in detail. Risks include but are not limited to bleeding, perforation, adverse reaction to medications, and cardiopulmonary compromise. Sequelae include but are not limited to the possibility of surgery, hospitalization, and mortality. The patient verbalized understanding and wished to proceed.    Lavena Bullion,  DO  05/21/2022, 4:56 PM [facility] Pager [phone]

## 2022-05-21 NOTE — Progress Notes (Signed)
Patient refused CPAP HS. Patient on 2L [place]. No distress at this time.

## 2022-05-21 NOTE — Progress Notes (Signed)
Terry [facility] ROUNDING NOTE   Subjective: Patient reports another episode of hematochezia overnight and again this afternoon.  Minimal lower abdominal cramping, but otherwise no frank abdominal pain.  No nausea/vomiting.  Unable to obtain colonoscopy report from [facility].  Patient states that there was no EGD done during the hospitalization.   Objective: Vital signs in last 24 hours: Temp:  [96.9 F (36.1 C)-98 F (36.7 C)] 98 F (36.7 C) (08/11 1600) Pulse Rate:  [61-93] 72 (08/11 1630) Resp:  [13-22] 14 (08/11 1630) BP: (63-119)/(35-76) 98/49 (08/11 1630) SpO2:  [83 %-100 %] 100 % (08/11 1630) Weight:  [72.9 kg-73.1 kg] 73.1 kg (08/11 0500) Last BM Date : 05/21/22 General: NAD Lungs:  CTA b/l, no w/r/r Heart:  RRR, no m/r/g Abdomen:  Soft, NT, ND, +BS    Intake/Output from previous day: 08/10 0701 - 08/11 0700 In: 1469.3 [I.V.:374.5; Blood:538.3; IV Piggyback:556.4] Out: -  Intake/Output this shift: Total I/O In: 132.2 [I.V.:132.2] Out: 0    Lab Results: Recent Labs    05/21/22 0601 05/21/22 1248 05/21/22 1417  WBC 5.4 6.1 6.7  HGB 8.3* 8.5* 8.5*  PLT 108* 127* 131*  MCV 92.8 94.5 93.2   BMET Recent Labs    05/20/22 1313 05/21/22 0217  NA 137 135  K 3.1* 3.8  CL 99 99  CO2 32 27  GLUCOSE 134* 107*  BUN 14 18  CREATININE 2.35* 2.79*  CALCIUM 7.8* 7.7*   LFT Recent Labs    05/20/22 1313  PROT 5.1*  ALBUMIN 2.3*  AST 13*  ALT 10  ALKPHOS 101  BILITOT 0.6   PT/INR Recent Labs    05/20/22 1448  INR 1.4*      Imaging/Other results: CT CHEST WO CONTRAST  Result Date: 05/20/2022 CLINICAL DATA:  Lung nodule, > 64mm Left lung nodule EXAM: CT CHEST WITHOUT CONTRAST TECHNIQUE: Multidetector CT imaging of the chest was performed following the standard protocol without IV contrast. RADIATION DOSE REDUCTION: This exam was performed according to the departmental dose-optimization program which includes automated exposure  control, adjustment of the mA and/or kV according to patient size and/or use of iterative reconstruction technique. COMPARISON:  None Available. FINDINGS: Cardiovascular: Extensive multi-vessel coronary artery calcification. Global cardiac size is within normal limits. No pericardial effusion. Central pulmonary arteries are enlarged in keeping with changes of pulmonary arterial hypertension. Extensive atherosclerotic calcification within the thoracic aorta. Mild dilation of the ascending thoracic aorta which measures 4.0 cm in greatest dimension. Descending thoracic aorta is of normal caliber. Vascular stent is partially visualized within the left cephalic arch. Mediastinum/Nodes: Visualized thyroid is unremarkable. No pathologic thoracic adenopathy. Esophagus is unremarkable. Lungs/Pleura: Large right pleural effusion is present with compressive atelectasis of the dependent right lung. There is a superimposed 2.6 x 1.9 cm mass identified within the right upper lobe which is difficult to accurately measure on this noncontrast examination with adjacent atelectatic lung. On the left, a complex moderate left pleural effusion is again identified within the posterior basal left hemithorax with associated visceral and parietal pleural thickening and complex internal architecture which may relate to hemorrhagic or proteinaceous fluid. There is associated subtotal collapse of the left lower lobe. Within the aerated lingula, a 1.7 x 3.0 cm rounded mass is again identified. This may represent rounded atelectasis, however, underlying neoplastic mass is not excluded. No pneumothorax. No central obstructing lesion. Upper Abdomen: No acute abnormality within the visualized upper abdomen. This is better assessed on CTA examination performed earlier. Musculoskeletal: No acute bone  abnormality. No lytic or blastic bone lesion. Multiple healed left rib fractures are noted. The osseous structures are diffusely osteopenic. IMPRESSION:  1. Large right pleural effusion with associated compressive atelectasis of the dependent right lung. 2. Moderate complex left pleural effusion with associated pleural thickening and complex internal architecture which may relate to hemorrhagic or proteinaceous fluid. Super infection, as can be seen with empyema, is not excluded and correlation with clinical laboratory examination is recommended. Diagnostic thoracentesis may be helpful for further evaluation. Associated subtotal collapse of the left lower lobe. 3. Superimposed 2.6 cm mass within the right upper lobe and 3.0 cm mass within the aerated lingula. In absence of prior examinations to determine acuity, PET CT examination is recommended for further evaluation, if clinically indicated. 4. Extensive multi-vessel coronary artery calcification. 5. Mild dilation of the ascending thoracic aorta which measures 4.0 cm in greatest dimension. Recommend annual imaging followup by CTA or MRA. This recommendation follows 2010 ACCF/AHA/AATS/ACR/ASA/SCA/SCAI/SIR/STS/SVM Guidelines for the Diagnosis and Management of Patients with Thoracic Aortic Disease. Circulation. 2010; 121: Z610-R604. Aortic aneurysm NOS (ICD10-I71.9) 6. Enlarged central pulmonary arteries in keeping with changes of pulmonary arterial hypertension. Aortic Atherosclerosis (ICD10-I70.0). Electronically Signed   By: Fidela Salisbury M.D.   On: 05/20/2022 22:58   CT Angio Abd/Pel W and/or Wo Contrast  Result Date: 05/20/2022 CLINICAL DATA:  87 year old male with a history of lower GI bleeding EXAM: CTA ABDOMEN AND PELVIS WITHOUT AND WITH CONTRAST TECHNIQUE: Multidetector CT imaging of the abdomen and pelvis was performed using the standard protocol during bolus administration of intravenous contrast. Multiplanar reconstructed images and MIPs were obtained and reviewed to evaluate the vascular anatomy. RADIATION DOSE REDUCTION: This exam was performed according to the departmental dose-optimization  program which includes automated exposure control, adjustment of the mA and/or kV according to patient size and/or use of iterative reconstruction technique. CONTRAST:  160mL OMNIPAQUE IOHEXOL 350 MG/ML SOLN COMPARISON:  CT 04/24/2019 FINDINGS: VASCULAR Aorta: Mild atherosclerotic changes of the lower thoracic aorta and the abdominal aorta. No pedunculated plaque, ulcerated plaque, dissection. No aneurysm. No periaortic fluid or inflammatory changes. Celiac: Celiac artery patent.  Branches patent. SMA: SMA patent with mild atherosclerosis at the origin and estimated less than 50% narrowing. Branches are patent. Renals: - Right: Single right renal artery. Atherosclerotic changes at the origin. Estimated 50% narrowing secondary to calcified plaque. - Left: Single left renal artery. Atherosclerotic changes at the origin with at least 50% narrowing secondary to calcified plaque. IMA: Inferior mesenteric artery is patent. Right lower extremity: Mild tortuosity of the right iliac system. Mild atherosclerotic changes without evidence of high-grade stenosis of the common iliac artery or external iliac artery. Hypogastric artery is patent. Pelvic arteries patent. Common femoral artery patent with mild atherosclerosis. Mixed calcified and soft plaque in the proximal right superficial femoral artery, which appears occluded after the origin. Profunda femoris patent. Left lower extremity: Tortuosity of the left iliac system. Mild atherosclerosis without high-grade stenosis or occlusion of the common iliac artery and external iliac artery. Hypogastric artery is patent as well as pelvic arteries. Common femoral artery patent with mild atherosclerosis. Proximal profunda femoris and SFA patent. Veins: Unremarkable appearance of the venous system. Review of the MIP images confirms the above findings. NON-VASCULAR Lower chest: Bilateral pleural effusions at the lung bases. Thickening of the left-sided visceral and parietal pleura, new  from the prior of 04/24/2019. Nodule at the left lung base measures 2.3 cm, new from the comparison. Atelectasis bilateral lung bases. Hepatobiliary: Unremarkable appearance of  the liver. Calcified stones within the gallbladder lumen. Pancreas: Rounded hypodense lesion within the pancreas body, levin mm, new from the comparison. No inflammatory changes of pancreas. Spleen: Unremarkable. Adrenals/Urinary Tract: - Right adrenal gland: Unremarkable - Left adrenal gland: Unremarkable. - Right kidney: No hydronephrosis. Small nonobstructing stones in the calyceal tips. Unremarkable right ureter. - Left Kidney: No hydronephrosis. Nonobstructing stones at the calyceal tips. Unremarkable course of the left ureter. - Urinary Bladder: Bladder is contracted. Stomach/Bowel: - Stomach: Unremarkable. - Small bowel: Small bowel decompressed with no transition point. No inflammatory changes. No puddling of contrast. - Appendix: Normal. - Colon: Colon decompressed with no transition point. No puddling of contrast. No wall thickening. No inflammatory changes. Left-sided diverticular disease. No transition point. Lymphatic: No adenopathy. Mesenteric: Mild edema within the mesentery. No free fluid or significant ascites. Reproductive: Unremarkable Other: Body wall edema/anasarca Musculoskeletal: Osteopenia. New compression fractures of L2 and L4. No significant height loss at L2. Approximately 50% height loss at L4. No significant retropulsion of the posterior endplates. Progression of the L1 compression fracture with approximately 50% height loss, progressed from the comparison. No bony canal narrowing. Degenerative changes of the hips. IMPRESSION: CT angiogram negative for evidence of acute gastrointestinal hemorrhage. Bilateral pleural effusions. The left side demonstrates pleural thickening of the visceral and parietal pleura, and empyema cannot be excluded. Given this finding as well as the presence of an indeterminate 2.3 cm lung  nodule on the left, complete chest CT imaging is recommended. Mild mesenteric and body wall edema/anasarca, suggesting fluid positive state. No overt pulmonary edema or ascites. Multilevel peripheral arterial disease, including mild aortic atherosclerosis, bilateral iliac atherosclerosis, and proximal femoral arterial disease in this patient with history of hemodialysis. Likely proximal right SFA occlusion. Aortic Atherosclerosis (ICD10-I70.0). 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 New compression fractures of L2 and L4, as above. There has been progression of prior L1 compression fracture. If symptomatic, further evaluation with MR lumbar spine may be considered to determine acuity/chronicity. Additional ancillary findings as above. Signed, Dulcy Fanny. Nadene Rubins, RPVI [facility] Electronically Signed   By: Corrie Mckusick D.O.   On: 05/20/2022 16:32      Assessment and Plan:  1) Hematochezia 2) Acute blood loss anemia 3) Diverticulosis 4) History of small bowel AVM - Xarelto held on admission - CT angio on admission was negative for active bleeding, but certainly still with continued bleeding clinically.  Good response to 2 unit PRBC transfusion  - Continue serial CBC checks with additional blood products as needed per protocol - Plan for colonoscopy tomorrow for diagnostic and therapeutic intent - If colonoscopy unrevealing, plan for EGD - Okay for clears for now with n.p.o. at midnight - Ordered bowel prep for procedures tomorrow - Consent signed at bedside with patient  6) ESRD on HD - HD per Nephrology service  7) Atrial fibrillation 8) Chronic anticoagulation -Xarelto held on admission -BP and rate management per primary CCS   9) B12 deficiency 10) Anemia of chronic disease (acute on chronic anemia) - Multifactorial anemia - B12 supplement  The indications,  risks, and benefits of EGD and colonoscopy were explained to the patient in detail. Risks include but are not limited to bleeding, perforation, adverse reaction to medications, and cardiopulmonary compromise. Sequelae include but are not limited to the possibility of surgery, hospitalization, and mortality. The patient verbalized understanding and wished to proceed.    Lavena Bullion,  DO  05/21/2022, 4:56 PM [place] [facility] Pager [phone]

## 2022-05-22 ENCOUNTER — Inpatient Hospital Stay (HOSPITAL_COMMUNITY): Payer: Medicare Other | Admitting: Certified Registered Nurse Anesthetist

## 2022-05-22 ENCOUNTER — Encounter (HOSPITAL_COMMUNITY): Payer: Self-pay | Admitting: Pulmonary Disease

## 2022-05-22 ENCOUNTER — Encounter (HOSPITAL_COMMUNITY)
Admission: EM | Disposition: A | Discharge status: Skilled Nursing Facility | Payer: Self-pay | Source: Home / Self Care | Attending: Family Medicine

## 2022-05-22 ENCOUNTER — Inpatient Hospital Stay (HOSPITAL_COMMUNITY): Payer: Medicare Other

## 2022-05-22 DIAGNOSIS — K648 Other hemorrhoids: Secondary | ICD-10-CM

## 2022-05-22 DIAGNOSIS — D62 Acute posthemorrhagic anemia: Secondary | ICD-10-CM

## 2022-05-22 DIAGNOSIS — K6381 Dieulafoy lesion of intestine: Secondary | ICD-10-CM | POA: Diagnosis not present

## 2022-05-22 DIAGNOSIS — K635 Polyp of colon: Secondary | ICD-10-CM

## 2022-05-22 DIAGNOSIS — K922 Gastrointestinal hemorrhage, unspecified: Secondary | ICD-10-CM | POA: Diagnosis not present

## 2022-05-22 DIAGNOSIS — I5032 Chronic diastolic (congestive) heart failure: Secondary | ICD-10-CM | POA: Diagnosis not present

## 2022-05-22 DIAGNOSIS — K641 Second degree hemorrhoids: Secondary | ICD-10-CM

## 2022-05-22 DIAGNOSIS — M1990 Unspecified osteoarthritis, unspecified site: Secondary | ICD-10-CM

## 2022-05-22 DIAGNOSIS — Z9989 Dependence on other enabling machines and devices: Secondary | ICD-10-CM

## 2022-05-22 DIAGNOSIS — J90 Pleural effusion, not elsewhere classified: Secondary | ICD-10-CM | POA: Diagnosis not present

## 2022-05-22 DIAGNOSIS — K921 Melena: Secondary | ICD-10-CM

## 2022-05-22 DIAGNOSIS — G4733 Obstructive sleep apnea (adult) (pediatric): Secondary | ICD-10-CM

## 2022-05-22 DIAGNOSIS — K5730 Diverticulosis of large intestine without perforation or abscess without bleeding: Secondary | ICD-10-CM | POA: Diagnosis not present

## 2022-05-22 HISTORY — PX: COLONOSCOPY: SHX5424

## 2022-05-22 HISTORY — PX: POLYPECTOMY: SHX5525

## 2022-05-22 HISTORY — PX: HEMOSTASIS CLIP PLACEMENT: SHX6857

## 2022-05-22 HISTORY — PX: SCLEROTHERAPY: SHX6841

## 2022-05-22 LAB — CBC
HCT: 26.1 % — ABNORMAL LOW (ref 39.0–52.0)
HCT: 27.4 % — ABNORMAL LOW (ref 39.0–52.0)
Hemoglobin: 8.7 g/dL — ABNORMAL LOW (ref 13.0–17.0)
Hemoglobin: 9.4 g/dL — ABNORMAL LOW (ref 13.0–17.0)
MCH: 30.7 pg (ref 26.0–34.0)
MCH: 31.4 pg (ref 26.0–34.0)
MCHC: 33.3 g/dL (ref 30.0–36.0)
MCHC: 34.3 g/dL (ref 30.0–36.0)
MCV: 92.2 fL (ref 80.0–100.0)
MCV: 91.6 fL (ref 80.0–100.0)
Platelets: 131 10*3/uL — ABNORMAL LOW (ref 150–400)
Platelets: 134 10*3/uL — ABNORMAL LOW (ref 150–400)
RBC: 2.83 MIL/uL — ABNORMAL LOW (ref 4.22–5.81)
RBC: 2.99 MIL/uL — ABNORMAL LOW (ref 4.22–5.81)
RDW: 17.5 % — ABNORMAL HIGH (ref 11.5–15.5)
RDW: 17.5 % — ABNORMAL HIGH (ref 11.5–15.5)
WBC: 6 10*3/uL (ref 4.0–10.5)
WBC: 6.8 10*3/uL (ref 4.0–10.5)
nRBC: 0 % (ref 0.0–0.2)
nRBC: 0 % (ref 0.0–0.2)

## 2022-05-22 LAB — BASIC METABOLIC PANEL
Anion gap: 10 (ref 5–15)
Anion gap: 7 (ref 5–15)
BUN: 27 mg/dL — ABNORMAL HIGH (ref 8–23)
BUN: 12 mg/dL (ref 8–23)
CO2: 25 mmol/L (ref 22–32)
CO2: 30 mmol/L (ref 22–32)
Calcium: 7.7 mg/dL — ABNORMAL LOW (ref 8.9–10.3)
Calcium: 7.4 mg/dL — ABNORMAL LOW (ref 8.9–10.3)
Chloride: 102 mmol/L (ref 98–111)
Chloride: 99 mmol/L (ref 98–111)
Creatinine, Ser: 3.6 mg/dL — ABNORMAL HIGH (ref 0.61–1.24)
Creatinine, Ser: 2.14 mg/dL — ABNORMAL HIGH (ref 0.61–1.24)
GFR, Estimated: 16 mL/min — ABNORMAL LOW (ref >60)
GFR, Estimated: 30 mL/min — ABNORMAL LOW (ref >60)
Glucose, Bld: 105 mg/dL — ABNORMAL HIGH (ref 70–99)
Glucose, Bld: 98 mg/dL (ref 70–99)
Potassium: 5.1 mmol/L (ref 3.5–5.1)
Potassium: 3.2 mmol/L — ABNORMAL LOW (ref 3.5–5.1)
Sodium: 137 mmol/L (ref 135–145)
Sodium: 136 mmol/L (ref 135–145)

## 2022-05-22 LAB — CBC WITH DIFFERENTIAL/PLATELET
Abs Immature Granulocytes: 0.03 10*3/uL (ref 0.00–0.07)
Basophils Absolute: 0 10*3/uL (ref 0.0–0.1)
Basophils Relative: 0 %
Eosinophils Absolute: 0.1 10*3/uL (ref 0.0–0.5)
Eosinophils Relative: 2 %
HCT: 23.8 % — ABNORMAL LOW (ref 39.0–52.0)
Hemoglobin: 7.9 g/dL — ABNORMAL LOW (ref 13.0–17.0)
Immature Granulocytes: 1 %
Lymphocytes Relative: 18 %
Lymphs Abs: 1.1 10*3/uL (ref 0.7–4.0)
MCH: 31 pg (ref 26.0–34.0)
MCHC: 33.2 g/dL (ref 30.0–36.0)
MCV: 93.3 fL (ref 80.0–100.0)
Monocytes Absolute: 0.7 10*3/uL (ref 0.1–1.0)
Monocytes Relative: 11 %
Neutro Abs: 4.3 10*3/uL (ref 1.7–7.7)
Neutrophils Relative %: 68 %
Platelets: 142 10*3/uL — ABNORMAL LOW (ref 150–400)
RBC: 2.55 MIL/uL — ABNORMAL LOW (ref 4.22–5.81)
RDW: 17.3 % — ABNORMAL HIGH (ref 11.5–15.5)
WBC: 6.3 10*3/uL (ref 4.0–10.5)
nRBC: 0 % (ref 0.0–0.2)

## 2022-05-22 LAB — GLUCOSE, CAPILLARY
Glucose-Capillary: 89 mg/dL (ref 70–99)
Glucose-Capillary: 69 mg/dL — ABNORMAL LOW (ref 70–99)
Glucose-Capillary: 82 mg/dL (ref 70–99)
Glucose-Capillary: 101 mg/dL — ABNORMAL HIGH (ref 70–99)
Glucose-Capillary: 171 mg/dL — ABNORMAL HIGH (ref 70–99)
Glucose-Capillary: 85 mg/dL (ref 70–99)

## 2022-05-22 LAB — ECHOCARDIOGRAM COMPLETE
Radius: 1.05 cm
S' Lateral: 2.7 cm
Weight: 2574.97 oz

## 2022-05-22 LAB — PROTIME-INR
INR: 1.3 — ABNORMAL HIGH (ref 0.8–1.2)
Prothrombin Time: 15.8 seconds — ABNORMAL HIGH (ref 11.4–15.2)

## 2022-05-22 LAB — HEPATITIS B SURFACE ANTIBODY, QUANTITATIVE: Hep B S AB Quant (Post): 8.6 m[IU]/mL — ABNORMAL LOW (ref >9.9)

## 2022-05-22 LAB — MAGNESIUM: Magnesium: 1.8 mg/dL (ref 1.7–2.4)

## 2022-05-22 LAB — POTASSIUM: Potassium: 3.3 mmol/L — ABNORMAL LOW (ref 3.5–5.1)

## 2022-05-22 LAB — PHOSPHORUS: Phosphorus: 2.8 mg/dL (ref 2.5–4.6)

## 2022-05-22 LAB — PREPARE RBC (CROSSMATCH)

## 2022-05-22 SURGERY — COLONOSCOPY
Anesthesia: Monitor Anesthesia Care | Laterality: Left

## 2022-05-22 MED ORDER — DEXTROSE 5 % IV SOLN: INTRAVENOUS | Status: AC

## 2022-05-22 MED ORDER — PHENYLEPHRINE 80 MCG/ML (10ML) SYRINGE FOR IV PUSH (FOR BLOOD PRESSURE SUPPORT)
PREFILLED_SYRINGE | INTRAVENOUS | Status: DC | PRN
  Administered 2022-05-22 (×3): 160 ug via INTRAVENOUS

## 2022-05-22 MED ORDER — MELATONIN 3 MG PO TABS
3.0000 mg | ORAL_TABLET | Freq: Once | ORAL | Status: AC
  Administered 2022-05-22: 3 mg via ORAL
  Filled 2022-05-22: qty 1

## 2022-05-22 MED ORDER — PROPOFOL 500 MG/50ML IV EMUL
INTRAVENOUS | Status: DC | PRN
  Administered 2022-05-22: 75 ug/kg/min via INTRAVENOUS

## 2022-05-22 MED ORDER — SODIUM CHLORIDE (PF) 0.9 % IJ SOLN
PREFILLED_SYRINGE | INTRAMUSCULAR | Status: DC | PRN
  Administered 2022-05-22: 3 mL

## 2022-05-22 MED ORDER — ACETAMINOPHEN 500 MG PO TABS
500.0000 mg | ORAL_TABLET | Freq: Once | ORAL | Status: AC
  Administered 2022-05-22: 500 mg via ORAL
  Filled 2022-05-22: qty 1

## 2022-05-22 MED ORDER — LACTATED RINGERS IV BOLUS
1000.0000 mL | Freq: Once | INTRAVENOUS | Status: AC
Start: 2022-05-22 — End: 2022-05-22
  Administered 2022-05-22: 1000 mL via INTRAVENOUS

## 2022-05-22 MED ORDER — PROPOFOL 10 MG/ML IV BOLUS
INTRAVENOUS | Status: DC | PRN
  Administered 2022-05-22: 50 mg via INTRAVENOUS

## 2022-05-22 MED ORDER — POTASSIUM CHLORIDE CRYS ER 20 MEQ PO TBCR
20.0000 meq | EXTENDED_RELEASE_TABLET | Freq: Once | ORAL | Status: AC
  Administered 2022-05-22: 20 meq via ORAL
  Filled 2022-05-22: qty 1

## 2022-05-22 MED ORDER — SODIUM CHLORIDE 0.9% IV SOLUTION: Freq: Once | INTRAVENOUS | Status: DC

## 2022-05-22 MED ORDER — PHENYLEPHRINE 80 MCG/ML (10ML) SYRINGE FOR IV PUSH (FOR BLOOD PRESSURE SUPPORT): PREFILLED_SYRINGE | INTRAVENOUS | Status: DC | PRN

## 2022-05-22 MED ORDER — LACTATED RINGERS IV SOLN: INTRAVENOUS | Status: DC | PRN

## 2022-05-22 NOTE — Op Note (Signed)
[facility] Patient Name: Terry Graves Procedure Date : 05/22/2022 MRN: [id number] Attending MD: Gerrit Heck , MD Date of Birth: October 04, 1935 CSN: [id number] Age: 87 Admit Type: Inpatient Procedure:                Colonoscopy Indications:              Hematochezia, Acute post hemorrhagic anemia Providers:                Gerrit Heck, MD, Benay Pillow, RN, William Dalton, Technician Referring MD:              Medicines:                Monitored Anesthesia Care Complications:            No immediate complications. Estimated Blood Loss:     Estimated blood loss was minimal. Procedure:                Pre-Anesthesia Assessment:                           - Prior to the procedure, a History and Physical                            was performed, and patient medications and                            allergies were reviewed. The patient's tolerance of                            previous anesthesia was also reviewed. The risks                            and benefits of the procedure and the sedation                            options and risks were discussed with the patient.                            All questions were answered, and informed consent                            was obtained. Prior Anticoagulants: The patient has                            taken Xarelto (rivaroxaban), last dose was 14 days                            prior to procedure. ASA Grade Assessment: IV - A                            patient with severe systemic disease that is a  constant threat to life. After reviewing the risks                            and benefits, the patient was deemed in                            satisfactory condition to undergo the procedure.                           After obtaining informed consent, the colonoscope                            was passed under direct vision. Throughout the                             procedure, the patient's blood pressure, pulse, and                            oxygen saturations were monitored continuously. The                            [id number]-HQ190L ([id number] Olympus colonoscope was                            introduced through the anus and advanced to the the                            terminal ileum. The colonoscopy was performed                            without difficulty. The patient tolerated the                            procedure well. The quality of the bowel                            preparation was good. The terminal ileum, ileocecal                            valve, appendiceal orifice, and rectum were                            photographed. Scope In: 12:31:24 PM Scope Out: 12:57:50 PM Scope Withdrawal Time: 0 hours 24 minutes 16 seconds  Total Procedure Duration: 0 hours 26 minutes 26 seconds  Findings:      The perianal and digital rectal examinations were normal.      Red blood was found in the entire colon. Lavage of the area was       performed using copious amounts of tap water, resulting in clearance       with good visualization. Located an actively bleeding Dieulafoy lesion       in the splenic flexure. This was briskly bleeding. The surrounding       mucosa was normal appearing. For hemostasis, four hemostatic clips were  successfully placed (MR conditional) with cessation of bleeding. Area       was then successfully injected with 3 mL of a 1:10,000 solution of       epinephrine for dual hemostasis. There was no bleeding at the end of the       procedure.      A 6 mm polyp was found in the appendiceal orifice. The polyp was       sessile. The polyp was removed with a cold snare. Resection and       retrieval were complete. To prevent bleeding after the polypectomy, one       hemostatic clip was successfully placed (MR conditional). There was no       bleeding at the end of the procedure. There were a few additional       smaller  polyps scattered throughout the colon. None were bleeding and       there were no high risk features. These were not removed so to not       increase the risk of post polypectomy bleeding further and complicate       the clinical picture.      Multiple small and large-mouthed diverticula were found in the sigmoid       colon and descending colon. Each diverticulum was lavaged clear using       copious amounts of tap water, resulting in clearance with good       visualization. No additional sources of bleeding noted. After treatment       of the Dieulafoy lesion, able to lavage the remainder of the colon clear       as well.      Non-bleeding internal hemorrhoids were found during retroflexion. The       hemorrhoids were small.      The terminal ileum appeared normal. Impression:               - Blood in the entire examined colon. This was                            lavaged. Located an actively bleeding Dieulafoy                            lesion in the splenic flexure. This was briskly                            bleeding. The surrounding mucosa was normal                            appearing. For hemostasis, four hemostatic clips                            were successfully placed (MR conditional) with                            cessation of bleeding. Area was then successfully                            injected with 3 mL of a 1:10,000 solution of  epinephrine for dual hemostasis. There was no                            bleeding at the end of the procedure.                           - One 6 mm polyp at the appendiceal orifice,                            removed with a cold snare. Resected and retrieved.                            Clip (MR conditional) was placed.                           - Diverticulosis in the sigmoid colon and in the                            descending colon.                           - Non-bleeding internal hemorrhoids.                            - The examined portion of the ileum was normal. Recommendation:           - Return patient to ICU for ongoing care.                           - Full liquid diet now, then advance as tolerated                            in ICU.                           - Continue present medications.                           - Await pathology results.                           - If needing to restart anticoagulation, would                            allow 2 more days of medication hold prior to                            resuming Xarelto (rivaroxaban).                           - Repeat colonoscopy PRN for retreatment if                            recurrence of bleeding.                           -  There were additional polyps noted on this study                            that were not removed. Based on age and                            co-morbidities, along with polyp appearance, do not                            think repeat colonsocopy for surveillance purposes                            is needed. Procedure Code(s):        --- Professional ---                           [id number], 59, Colonoscopy, flexible; with control of                            bleeding, any method                           45385, Colonoscopy, flexible; with removal of                            tumor(s), polyp(s), or other lesion(s) by snare                            technique Diagnosis Code(s):        --- Professional ---                           K92.2, Gastrointestinal hemorrhage, unspecified                           K63.5, Polyp of colon                           K64.8, Other hemorrhoids                           K92.1, Melena (includes Hematochezia)                           D62, Acute posthemorrhagic anemia                           K57.30, Diverticulosis of large intestine without                            perforation or abscess without bleeding CPT copyright 2019 [employer] Medical Association. All rights  reserved. The codes documented in this report are preliminary and upon coder review may  be revised to meet current compliance requirements. Gerrit Heck, MD 05/22/2022 1:17:01 PM Number of Addenda: 0

## 2022-05-22 NOTE — Anesthesia Postprocedure Evaluation (Signed)
Anesthesia Post Note  Patient: Terry Graves.  Procedure(s) Performed: COLONOSCOPY (Left) POLYPECTOMY HEMOSTASIS CLIP PLACEMENT SCLEROTHERAPY     Patient location during evaluation: Endoscopy Anesthesia Type: MAC Level of consciousness: awake and alert Pain management: pain level controlled Vital Signs Assessment: post-procedure vital signs reviewed and stable Respiratory status: spontaneous breathing, nonlabored ventilation, respiratory function stable and patient connected to nasal cannula oxygen Cardiovascular status: stable and blood pressure returned to baseline Postop Assessment: no apparent nausea or vomiting Anesthetic complications: no   No notable events documented.  Last Vitals:  Vitals:   05/22/22 2130 05/22/22 2200  BP: (!) 88/42 (!) 94/47  Pulse: 81 82  Resp: 14 15  Temp:    SpO2: 100% 100%    Last Pain:  Vitals:   05/22/22 2000  TempSrc:   PainSc: 0-No pain                 Santa Lighter

## 2022-05-22 NOTE — Interval H&P Note (Signed)
History and Physical Interval Note:  Completed bowel prep without issue.  BRBPR.  Repeat hemoglobin 7.9 this morning, and transfused 1 unit PRBCs.  Repeat INR 1.3.  Xarelto being held.  Plan to start with colonoscopy, and if unrevealing proceed with EGD.  05/22/2022 12:02 PM  [name]  has presented today for surgery, with the diagnosis of Hematochezia, acute blood loss anemia.  The various methods of treatment have been discussed with the patient and family. After consideration of risks, benefits and other options for treatment, the patient has consented to  Procedure(s): COLONOSCOPY (Left) ESOPHAGOGASTRODUODENOSCOPY (EGD) (Left) as a surgical intervention.  The patient's history has been reviewed, patient examined, no change in status, stable for surgery.  I have reviewed the patient's chart and labs.  Questions were answered to the patient's satisfaction.     Dominic Pea Bralynn Velador

## 2022-05-22 NOTE — Progress Notes (Addendum)
NAME:  Terry Franko., MRN:  [id number], DOB:  January 24, 1935, LOS: 0 ADMISSION DATE:  05/20/2022, CONSULTATION DATE:  8/10 REFERRING MD:  Dr. Billy Fischer, CHIEF COMPLAINT:  GI bleed   History of Present Illness:  Patient is a 87 year old male with pertinent PMH of ESRD (dialysis M, W, F), hypothyroidism, GERD, HFpEF, chronic A-fib on Xarelto (off Xarelto since 7/26), OSA on CPAP, recent GI bleed presents to [facility] ED on 8/10 with GI bleed.  Patient was recently admitted to [facility] on 7/26.  Noted to have colonic diverticulitis and small bowel AVM bleed.  Home Xarelto was held.  Patient given PRBC for low hemoglobin.  Patient was discharged about a week ago and to follow-up with GI outpatient.  Since discharge patient has still had persistent bleeding with BM about a cup of blood per day.  States he really has not had no hematemesis, maybe once.  No heavy EtOH abuse.  Denies fevers, SOB, chest pain.  Does have some elbow pain and LLQ with bowel movements.   Patient presents to [facility] on 8/10 with GI bleed.  Patient mildly hypotensive, otherwise hemodynamically stable.  Afebrile.  On room air.  Initial Hgb 5.9.  Given 2 units PRBCs.  Patient's MAP less than 65 despite PRBCs. K 3.1, creat 2.35 (close to baseline), WBC 4.9.  PCCM consulted for ICU admission.  Pertinent  Medical History   Past Medical History:  Diagnosis Date   Anemia    Arthritis    Atrial fibrillation (HCC)    CHF (congestive heart failure) (HCC)    Chronic kidney disease-mineral and bone disorder    ESRD on hemodialysis ([facility])    takes HD in [place] on MWF schedule   Headache    HOH (hard of hearing)    Hyperlipidemia    Hypertension    Hypothyroidism    Peripheral vascular disease (HCC)    Proteinuria    Sleep apnea    wears CPAP 12   Type 2 diabetes mellitus with stage 5 chronic kidney disease ([place])    Wears glasses     Significant Hospital Events: Including procedures, antibiotic start and  stop dates in addition to other pertinent events   8/10 Admitted to [facility] ICU and transfused 2 units 8/11 7-10 bloody bowel movements, prep received yesterday for planned colonoscopy today  Interim History / Subjective:  Blood pressures <65 overnight, remained on [place], received HD in unit.  Objective   Blood pressure (!) 98/53, pulse 79, temperature (!) 97.5 F (36.4 C), temperature source Oral, resp. rate 14, weight 73.1 kg, SpO2 100 %.        Intake/Output Summary (Last 24 hours) at 05/22/2022 0539 Last data filed at 05/22/2022 0258 Gross per 24 hour  Intake 1396.36 ml  Output 0 ml  Net 1396.36 ml    Filed Weights   05/20/22 1900 05/21/22 0500  Weight: 72.9 kg 73.1 kg    Examination: General: Elderly male, in no acute distress HEENT: MM pink/moist Neuro: Aox3; MAE CV: irregularly irregular, rate 80s, no m/r/g PULM:  CTAB, breathing comfortably on 2L [place] GI: soft, bsx4 active, no tenderness Extremities: warm/dry, L BKA, R AKA; LUE fistula without erythema and palpable thrill Skin: no rashes or lesions appreciated  Echo pending Na 137 K 5.1 BUN 27 Hgb 8.7-> 7.9 Platelets 142 INR 1.3 Net + 2.8L  Resolved Hospital Problem list     Assessment & Plan:   Hemorrhagic shock due to GIB GI bleed - hematochezia  Blood loss anemia Hx colonic diverticulitis and SB AVM Thrombocytopenia Elevated INR He had several blood bowel movements overnight. Hgb dropped 8.7 to 7.9. Unable to review colonoscopy from [facility]. GI planning for colonoscopy +/- EGD today, if continuing to bleed unsure if this is a possibility today.  -LR bolus -will transfuse 1 unit blood -GI following -CBC q 8  ESRD HD MWF Received HD 8/12 in AM, but unable to pull volume due to hypotension. Nephrology following. K at 5.1, but hemolyzed.  - strict I+Os, daily weights -Trend BMP -Replace electrolytes as indicated -Avoid nephrotoxic agents, ensure adequate renal perfusion  HFrEF HTN 7/20 EF at 35-40%. Echo  ordered 8/11, pending. Net + 2.8L. He does not make urine. - holding home antihypertensives -Daily weights; strict I/os - ECHO  Afib No recent Eliquis use. -Telemetry monitoring -currently rate controlled. BB being held due to hypotension  Lung Mass Pleural effusions 2.3 cm left lung nodule and complex left pleural effusion. 2.6 cm mass RUL nad 3.0 cm mass lingula. Daughter reports PET scan done in [place], but unsure of results. He follows with Dr. Donovan Kail in [place] for lung mass. He has not undergone biopsy yet. - consider diagnostic thoracentesis  OSA - CPAP QHS  DM NPO for procedure, hypoglycemia overnight. -D5 infusion 50 mL/ hr - CBG monitoring - sensitive SSI  Hypothyroidism -hold home synthroid while npo - restart once taking PO again  GERD - PPI  PVD hx BL BKA -hold home ropinirole -mobilize in bed as able to avoid pressure sores   Best Practice (right click and "Reselect all SmartList Selections" daily)   Diet/type: NPO DVT prophylaxis: not indicated in setting of GI bleed GI prophylaxis: PPI Lines: N/A Foley:  N/A Code Status:  full code Last date of multidisciplinary goals of care discussion [spoke w daughter, Ms. Adams 8/12]  Daleen Bo. Boruch Manuele, D.O.  Internal Medicine Resident, PGY-2 [facility]  Pager: [phone] 5:54 AM, 05/22/2022

## 2022-05-22 NOTE — Anesthesia Preprocedure Evaluation (Addendum)
Anesthesia Evaluation  Patient identified by MRN, date of birth, ID band Patient awake    Reviewed: Allergy & Precautions, NPO status , Patient's Chart, lab work & pertinent test results, reviewed documented beta blocker date and time   Airway Mallampati: II  TM Distance: >3 FB Neck ROM: Full    Dental  (+) Dental Advisory Given, Poor Dentition, Chipped,    Pulmonary sleep apnea and Continuous Positive Airway Pressure Ventilation , Current Smoker and Patient abstained from smoking.,    Pulmonary exam normal breath sounds clear to auscultation       Cardiovascular hypertension, Pt. on home beta blockers and Pt. on medications + Peripheral Vascular Disease and +CHF  Normal cardiovascular exam+ dysrhythmias Atrial Fibrillation  Rhythm:Regular Rate:Normal     Neuro/Psych  Headaches, negative psych ROS   GI/Hepatic Neg liver ROS, PUD,   Endo/Other  diabetes, Type 2Hypothyroidism   Renal/GU ESRF and DialysisRenal disease (MWF)     Musculoskeletal  (+) Arthritis ,   Abdominal   Peds  Hematology  (+) Blood dyscrasia (Thrombocytopenia; Xarelto), anemia ,   Anesthesia Other Findings   Reproductive/Obstetrics                           Anesthesia Physical Anesthesia Plan  ASA: 4  Anesthesia Plan: MAC   Post-op Pain Management: Minimal or no pain anticipated   Induction: Intravenous  PONV Risk Score and Plan: 0 and TIVA and Treatment may vary due to age or medical condition  Airway Management Planned: Nasal Cannula and Natural Airway  Additional Equipment:   Intra-op Plan:   Post-operative Plan:   Informed Consent: I have reviewed the patients History and Physical, chart, labs and discussed the procedure including the risks, benefits and alternatives for the proposed anesthesia with the patient or authorized representative who has indicated his/her understanding and acceptance.     Dental  advisory given  Plan Discussed with: CRNA  Anesthesia Plan Comments:        Anesthesia Quick Evaluation                                  Anesthesia Evaluation  Patient identified by MRN, date of birth, ID band Patient awake    Reviewed: Allergy & Precautions, NPO status , Patient's Chart, lab work & pertinent test results  History of Anesthesia Complications Negative for: history of anesthetic complications  Airway Mallampati: II  TM Distance: >3 FB Neck ROM: Full    Dental  (+) Dental Advisory Given, Chipped, Poor Dentition,    Pulmonary sleep apnea and Continuous Positive Airway Pressure Ventilation , Current SmokerPatient did not abstain from smoking.,    Pulmonary exam normal breath sounds clear to auscultation       Cardiovascular hypertension, Pt. on home beta blockers (-) angina+ Peripheral Vascular Disease and +CHF  Normal cardiovascular exam+ dysrhythmias Atrial Fibrillation  Rhythm:Regular Rate:Normal     Neuro/Psych  Headaches, negative psych ROS   GI/Hepatic GERD  Medicated,  Endo/Other  diabetes, Well Controlled, Type 2Hypothyroidism   Renal/GU ESRF and DialysisRenal disease (K+ 0.1)[id number] COMPLICATION, AVF     Musculoskeletal  (+) Arthritis ,   Abdominal   Peds  Hematology  (+) Blood dyscrasia (Xarelto), ,   Anesthesia Other Findings   Reproductive/Obstetrics  Anesthesia Physical Anesthesia Plan  ASA: III  Anesthesia Plan: MAC   Post-op Pain Management:    Induction: Intravenous  PONV Risk Score and Plan: 0 and Propofol infusion and Treatment may vary due to age or medical condition  Airway Management Planned: Natural Airway and Nasal Cannula  Additional Equipment:   Intra-op Plan:   Post-operative Plan:   Informed Consent: I have reviewed the patients History and Physical, chart, labs and discussed the procedure including the risks, benefits and  alternatives for the proposed anesthesia with the patient or authorized representative who has indicated his/her understanding and acceptance.     Dental advisory given  Plan Discussed with: CRNA  Anesthesia Plan Comments: (PAT note written 03/27/2020 by Myra Gianotti, PA-C. )      Anesthesia Quick Evaluation

## 2022-05-22 NOTE — Progress Notes (Signed)
Hd tx of 3.25hrs completed. 75.6L total vol processed. No complications noted. Report given to ginger hinson, rn.  Total uf removed: 42ml Post hd v/s: 97.5 98/53(63) 79 14 100% Post hd wt: 74.1kgs

## 2022-05-22 NOTE — Progress Notes (Addendum)
[facility] Progress Note Patient Name: Terry Graves. DOB: 05-22-1935 MRN: [id number]   Date of Service  05/22/2022  HPI/Events of Note  RN asking for K 3.2 from noon, not being replaced. Discussed. HD -done yesterday.   eICU Interventions  Repeat stat potasium , if low call back for replacement.      Intervention Category Intermediate Interventions: Electrolyte abnormality - evaluation and management  Elmer Sow 05/22/2022, 8:53 PM  21;59  Tylenol ordered for right sided lung pain from his ongoing effusion, CHF. On camera eval he is resting, stable Vitals. Discussed with RN. Melatonin ordered for sleep also.  If pain not better to call back, does not look like from Cardiac issues like angina.   22:56  Kcl 20 meq ordered for K at 3.3, Mag ok at 1.8 Follow AM lab.

## 2022-05-22 NOTE — Transfer of Care (Signed)
Immediate Anesthesia Transfer of Care Note  Patient: Terry Graves.  Procedure(s) Performed: COLONOSCOPY (Left) ESOPHAGOGASTRODUODENOSCOPY (EGD) (Left) POLYPECTOMY HEMOSTASIS CLIP PLACEMENT SCLEROTHERAPY  Patient Location: PACU  Anesthesia Type:MAC  Level of Consciousness: drowsy and patient cooperative  Airway & Oxygen Therapy: Patient Spontanous Breathing and Patient connected to nasal cannula oxygen  Post-op Assessment: Report given to RN, Post -op Vital signs reviewed and stable and Patient moving all extremities X 4  Post vital signs: Reviewed and stable  Last Vitals:  Vitals Value Taken Time  BP    Temp    Pulse 72 05/22/22 1303  Resp 0 05/22/22 1303  SpO2 99 % 05/22/22 1303  Vitals shown include unvalidated device data.  Last Pain:  Vitals:   05/22/22 1130  TempSrc: Oral  PainSc: 0-No pain         Complications: No notable events documented.

## 2022-05-22 NOTE — Progress Notes (Signed)
[facility] Progress Note  Subjective: seen in room, no c/o  Vitals:   05/22/22 0730 05/22/22 0800 05/22/22 0830 05/22/22 0846  BP: (!) 96/46 (!) 83/39 (!) 91/41 (!) 91/41  Pulse: 69 82 84 75  Resp: 17 15 17 15   Temp:  97.8 F (36.6 C)  98.1 F (36.7 C)  TempSrc:  Oral  Oral  SpO2: 100% 90% 100%   Weight:        Exam: Gen alert, no distress No jvd or bruits Chest clear bilat to bases RRR no MRG Abd soft ntnd no mass or ascites MS R AKA and L BKA Ext no edema Neuro is alert, Ox 3 , nf    LUA AVF+bruit    Home meds include - atrovastatin, carvedilol 12.5 bid, levothyroxine, MVI, pantoprazole, ropinirole, terazosin, xarelto, prns/ vits / supps    CT chest 8/10 - IMPRESSION: 1. Large right pleural effusion with associated compressive atelectasis of the dependent right lung. 2. Moderate complex left pleural effusion with associated pleural thickening and complex internal architecture which may relate to hemorrhagic or proteinaceous fluid. Super infection, as can be seen with empyema, is not excluded and correlation with clinical laboratory examination is recommended. Diagnostic thoracentesis may be helpful for further evaluation. Associated subtotal collapse of the left lower lobe. 3. Superimposed 2.6 cm mass within the right upper lobe and 3.0 cm mass within the aerated lingula.    OP HD: MWF [place]  - last hep B labs: 8/10, non- immune      Assessment/ Plan: GI bleed - hx recent SB AVM bleed. GI to take for endoscopy today. PPI infusion.  Shock - due to hemorrhage as above, not on pressors at this time Pleural effusions/ lung mass - by CT. Is being f/b doctor in [place] for lung mass. Per CCM.  ESRD - on HD MWF in [place]. Had HD here overnight last night. Next HD Monday.  Volume - euvolemic on exam, on room air. Stable.   Anemia esrd - Hb 5.9 on admission, up to 8s after 2u prbcs. Follow.  MBD ckd - CCa in range, will add on phos.  Atrial fib -  holding blood thinners d/t #1 HTN - bp's soft, holding home coreg PAD / bilat LE amputee    Rob Lemmie Vanlanen 05/22/2022, 10:11 AM   Recent Labs  Lab 05/20/22 1313 05/20/22 2105 05/21/22 0217 05/21/22 0601 05/21/22 1753 05/22/22 0018 05/22/22 0218  HGB 5.9*   < > 8.6*   < > 8.7*  --  7.9*  ALBUMIN 2.3*  --   --   --   --   --   --   CALCIUM 7.8*  --  7.7*  --   --  7.7*  --   CREATININE 2.35*  --  2.79*  --   --  3.60*  --   K 3.1*  --  3.8  --   --  5.1  --    < > = values in this interval not displayed.   Recent Labs  Lab 05/20/22 1313  IRON 78  TIBC 147*  FERRITIN 1,069*   Inpatient medications:  sodium chloride   Intravenous Once   sodium chloride   Intravenous Once   Chlorhexidine Gluconate Cloth  6 each Topical Daily   Chlorhexidine Gluconate Cloth  6 each Topical Q0600   cyanocobalamin  1,000 mcg Intramuscular Q7 days   insulin aspart  0-6 Units Subcutaneous Q4H   pantoprazole (PROTONIX) IV  40 mg  Intravenous Q12H   sodium chloride flush  3 mL Intravenous Q12H    sodium chloride 10 mL/hr at 05/22/22 0000   sodium chloride Stopped (05/22/22 0735)   dextrose 50 mL/hr at 05/22/22 0800   lactated ringers     sodium chloride, docusate sodium, ipratropium-albuterol, mouth rinse, polyethylene glycol

## 2022-05-22 NOTE — Progress Notes (Signed)
  Echocardiogram 2D Echocardiogram has been performed.  Terry Graves F 05/22/2022, 9:09 AM

## 2022-05-23 ENCOUNTER — Encounter (HOSPITAL_COMMUNITY): Payer: Self-pay | Admitting: Gastroenterology

## 2022-05-23 ENCOUNTER — Inpatient Hospital Stay (HOSPITAL_COMMUNITY): Payer: Medicare Other

## 2022-05-23 DIAGNOSIS — J90 Pleural effusion, not elsewhere classified: Secondary | ICD-10-CM | POA: Diagnosis not present

## 2022-05-23 DIAGNOSIS — K921 Melena: Secondary | ICD-10-CM | POA: Diagnosis not present

## 2022-05-23 DIAGNOSIS — D649 Anemia, unspecified: Secondary | ICD-10-CM | POA: Diagnosis not present

## 2022-05-23 DIAGNOSIS — K6381 Dieulafoy lesion of intestine: Secondary | ICD-10-CM | POA: Diagnosis not present

## 2022-05-23 DIAGNOSIS — K922 Gastrointestinal hemorrhage, unspecified: Secondary | ICD-10-CM | POA: Diagnosis not present

## 2022-05-23 DIAGNOSIS — K5731 Diverticulosis of large intestine without perforation or abscess with bleeding: Secondary | ICD-10-CM | POA: Diagnosis not present

## 2022-05-23 LAB — LACTATE DEHYDROGENASE, PLEURAL OR PERITONEAL FLUID: LD, Fluid: 43 U/L — ABNORMAL HIGH (ref 3–23)

## 2022-05-23 LAB — BASIC METABOLIC PANEL
Anion gap: 8 (ref 5–15)
BUN: 16 mg/dL (ref 8–23)
CO2: 27 mmol/L (ref 22–32)
Calcium: 7.2 mg/dL — ABNORMAL LOW (ref 8.9–10.3)
Chloride: 98 mmol/L (ref 98–111)
Creatinine, Ser: 2.61 mg/dL — ABNORMAL HIGH (ref 0.61–1.24)
GFR, Estimated: 23 mL/min — ABNORMAL LOW (ref >60)
Glucose, Bld: 82 mg/dL (ref 70–99)
Potassium: 3.6 mmol/L (ref 3.5–5.1)
Sodium: 133 mmol/L — ABNORMAL LOW (ref 135–145)

## 2022-05-23 LAB — LACTATE DEHYDROGENASE: LDH: 109 U/L (ref 98–192)

## 2022-05-23 LAB — CBC
HCT: 27.9 % — ABNORMAL LOW (ref 39.0–52.0)
HCT: 26.9 % — ABNORMAL LOW (ref 39.0–52.0)
Hemoglobin: 9.4 g/dL — ABNORMAL LOW (ref 13.0–17.0)
Hemoglobin: 9 g/dL — ABNORMAL LOW (ref 13.0–17.0)
MCH: 31.3 pg (ref 26.0–34.0)
MCH: 31.3 pg (ref 26.0–34.0)
MCHC: 33.7 g/dL (ref 30.0–36.0)
MCHC: 33.5 g/dL (ref 30.0–36.0)
MCV: 93 fL (ref 80.0–100.0)
MCV: 93.4 fL (ref 80.0–100.0)
Platelets: 139 10*3/uL — ABNORMAL LOW (ref 150–400)
Platelets: 131 10*3/uL — ABNORMAL LOW (ref 150–400)
RBC: 3 MIL/uL — ABNORMAL LOW (ref 4.22–5.81)
RBC: 2.88 MIL/uL — ABNORMAL LOW (ref 4.22–5.81)
RDW: 17.3 % — ABNORMAL HIGH (ref 11.5–15.5)
RDW: 17.2 % — ABNORMAL HIGH (ref 11.5–15.5)
WBC: 9.5 10*3/uL (ref 4.0–10.5)
WBC: 9.2 10*3/uL (ref 4.0–10.5)
nRBC: 0 % (ref 0.0–0.2)
nRBC: 0 % (ref 0.0–0.2)

## 2022-05-23 LAB — PROTEIN, PLEURAL OR PERITONEAL FLUID: Total protein, fluid: <3 g/dL

## 2022-05-23 LAB — BODY FLUID CELL COUNT WITH DIFFERENTIAL
Eos, Fluid: 0 %
Lymphs, Fluid: 74 %
Monocyte-Macrophage-Serous Fluid: 26 % — ABNORMAL LOW (ref 50–90)
Neutrophil Count, Fluid: 0 % (ref 0–25)
Total Nucleated Cell Count, Fluid: 226 cu mm (ref 0–1000)

## 2022-05-23 LAB — GLUCOSE, CAPILLARY
Glucose-Capillary: 79 mg/dL (ref 70–99)
Glucose-Capillary: 82 mg/dL (ref 70–99)
Glucose-Capillary: 107 mg/dL — ABNORMAL HIGH (ref 70–99)
Glucose-Capillary: 120 mg/dL — ABNORMAL HIGH (ref 70–99)
Glucose-Capillary: 138 mg/dL — ABNORMAL HIGH (ref 70–99)
Glucose-Capillary: 144 mg/dL — ABNORMAL HIGH (ref 70–99)

## 2022-05-23 LAB — GLUCOSE, PLEURAL OR PERITONEAL FLUID: Glucose, Fluid: 105 mg/dL

## 2022-05-23 LAB — PROTEIN, TOTAL: Total Protein: 4.9 g/dL — ABNORMAL LOW (ref 6.5–8.1)

## 2022-05-23 MED ORDER — CHLORHEXIDINE GLUCONATE CLOTH 2 % EX PADS: 6.0000 | MEDICATED_PAD | Freq: Every day | CUTANEOUS | Status: DC

## 2022-05-23 MED ORDER — PANTOPRAZOLE SODIUM 40 MG IV SOLR: 40.0000 mg | Freq: Every day | INTRAVENOUS | Status: DC

## 2022-05-23 MED ORDER — LEVOTHYROXINE SODIUM 25 MCG PO TABS
25.0000 ug | ORAL_TABLET | Freq: Every day | ORAL | Status: DC
  Administered 2022-05-24 – 2022-06-01 (×9): 25 ug via ORAL
  Filled 2022-05-23 (×9): qty 1

## 2022-05-23 MED ORDER — MIDODRINE HCL 5 MG PO TABS
10.0000 mg | ORAL_TABLET | Freq: Three times a day (TID) | ORAL | Status: DC
  Administered 2022-05-23 – 2022-05-24 (×5): 10 mg via ORAL
  Filled 2022-05-23 (×5): qty 2

## 2022-05-23 MED ORDER — ATORVASTATIN CALCIUM 40 MG PO TABS
40.0000 mg | ORAL_TABLET | Freq: Every day | ORAL | Status: DC
Start: 2022-05-23 — End: 2022-06-02
  Administered 2022-05-23 – 2022-06-01 (×9): 40 mg via ORAL
  Filled 2022-05-23 (×9): qty 1

## 2022-05-23 MED ORDER — ROPINIROLE HCL 0.5 MG PO TABS
0.5000 mg | ORAL_TABLET | Freq: Every day | ORAL | Status: DC
  Administered 2022-05-23 – 2022-06-01 (×9): 0.5 mg via ORAL
  Filled 2022-05-23 (×9): qty 1

## 2022-05-23 MED ORDER — EPINEPHRINE 1 MG/10ML IJ SOSY
PREFILLED_SYRINGE | INTRAMUSCULAR | Status: AC
  Filled 2022-05-23: qty 10

## 2022-05-23 MED ORDER — GUAIFENESIN 100 MG/5ML PO LIQD
5.0000 mL | Freq: Four times a day (QID) | ORAL | Status: DC | PRN
  Administered 2022-05-23: 5 mL via ORAL
  Filled 2022-05-23: qty 5

## 2022-05-23 MED ORDER — PANTOPRAZOLE SODIUM 40 MG PO TBEC
40.0000 mg | DELAYED_RELEASE_TABLET | Freq: Every day | ORAL | Status: DC
  Administered 2022-05-23 – 2022-06-01 (×10): 40 mg via ORAL
  Filled 2022-05-23 (×10): qty 1

## 2022-05-23 NOTE — Progress Notes (Signed)
Daughter, Vaughan Basta updated by phone. She is concerned about when and how he will have biopsy of lung mass completed. His pulmonologist is Dr. Donovan Kail in [place].

## 2022-05-23 NOTE — Procedures (Addendum)
Thoracentesis  Procedure Note  Terry Graves  [id number]  1935/08/29  Date:05/23/22  Time:11:03 AM   Provider Performing:Jeslie Lowe Rodman Pickle, MD  Procedure: Thoracentesis with imaging guidance [phone])  Indication(s) Pleural Effusion  Consent Risks of the procedure as well as the alternatives and risks of each were explained to the patient and/or caregiver.  Consent for the procedure was obtained and is signed in the bedside chart  Anesthesia Topical only with 1% lidocaine , 8 cc   Time Out Verified patient identification, verified procedure, site/side was marked, verified correct patient position, special equipment/implants available, medications/allergies/relevant history reviewed, required imaging and test results available.   Sterile Technique Maximal sterile technique including full sterile barrier drape, hand hygiene, sterile gown, sterile gloves, mask, hair covering, sterile ultrasound probe cover (if used).  Procedure Description Ultrasound was used to identify appropriate pleural anatomy for placement and overlying skin marked.  Area of drainage cleaned and draped in sterile fashion. Lidocaine was used to anesthetize the skin and subcutaneous tissue.  850 cc's of straw appearing fluid was drained from the right pleural space. Catheter then removed and bandaid applied to site.     Complications/Tolerance None; patient tolerated the procedure well. Chest X-ray is ordered to confirm no post-procedural complication.   EBL Minimal   Specimen(s) Pleural fluid

## 2022-05-23 NOTE — Progress Notes (Signed)
NAME:  Terry Graves., MRN:  [id number], DOB:  07-11-1935, LOS: 0 ADMISSION DATE:  05/20/2022, CONSULTATION DATE:  8/10 REFERRING MD:  Dr. Billy Fischer, CHIEF COMPLAINT:  GI bleed   History of Present Illness:  Patient is a 87 year old male with pertinent PMH of ESRD (dialysis M, W, F), hypothyroidism, GERD, HFpEF, chronic A-fib on Xarelto (off Xarelto since 7/26), OSA on CPAP, recent GI bleed presents to [facility] ED on 8/10 with GI bleed.  Patient was recently admitted to [facility] on 7/26.  Noted to have colonic diverticulitis and small bowel AVM bleed.  Home Xarelto was held.  Patient given PRBC for low hemoglobin.  Patient was discharged about a week ago and to follow-up with GI outpatient.  Since discharge patient has still had persistent bleeding with BM about a cup of blood per day.  States he really has not had no hematemesis, maybe once.  No heavy EtOH abuse.  Denies fevers, SOB, chest pain.  Does have some elbow pain and LLQ with bowel movements.   Patient presents to [facility] on 8/10 with GI bleed.  Patient mildly hypotensive, otherwise hemodynamically stable.  Afebrile.  On room air.  Initial Hgb 5.9.  Given 2 units PRBCs.  Patient's MAP less than 65 despite PRBCs. K 3.1, creat 2.35 (close to baseline), WBC 4.9.  PCCM consulted for ICU admission.  Pertinent  Medical History   Past Medical History:  Diagnosis Date   Anemia    Arthritis    Atrial fibrillation (HCC)    CHF (congestive heart failure) (HCC)    Chronic kidney disease-mineral and bone disorder    ESRD on hemodialysis ([place])    takes HD in [place] on MWF schedule   Headache    HOH (hard of hearing)    Hyperlipidemia    Hypertension    Hypothyroidism    Peripheral vascular disease (HCC)    Proteinuria    Sleep apnea    wears CPAP 12   Type 2 diabetes mellitus with stage 5 chronic kidney disease ([place])    Wears glasses     Significant Hospital Events: Including procedures, antibiotic start and  stop dates in addition to other pertinent events   8/10 Admitted to [facility] ICU and transfused 2 units 8/12 7-10 bloody bowel movements, prep received yesterday for planned colonoscopy today  Interim History / Subjective:  MAPs 50s-60s overnight Remains on 2L [place], pain with coughing was given tylenol and robitussin.  This AM denies chest pain. He feels very short of breath.  Objective   Blood pressure 92/70, pulse 93, temperature 97.7 F (36.5 C), temperature source Oral, resp. rate (!) 23, weight 73 kg, SpO2 95 %.        Intake/Output Summary (Last 24 hours) at 05/23/2022 0551 Last data filed at 05/23/2022 0500 Gross per 24 hour  Intake 3586.35 ml  Output 0 ml  Net 3586.35 ml    Filed Weights   05/20/22 1900 05/21/22 0500 05/21/22 2000  Weight: 72.9 kg 73.1 kg 73 kg    Examination: General: Elderly male, in no acute distress HEENT: MM pink/moist Neuro: Aox3; MAE CV: irregularly irregular, rate 80s, no m/r/g PULM:  decreased breath sounds to lungs bases bilaterally, breathing comfortably on 2L [place] GI: soft, bsx4 active, no tenderness Extremities: warm/dry, L BKA, R AKA; LUE fistula without erythema and palpable thrill Skin: no rashes or lesions appreciated  Echo pending Na 133 K 3.6 BUN 16 Hgb 8.7-> 9.4 stble Platelets 139 Net +  6L  Echo with EF 60-65% with moderate concentric LVH, moderate increased pulmonary artery pressure, right atrium dilated moderately severe tricuspid regurgitation, mitral regurgitation  Resolved Hospital Problem list     Assessment & Plan:    Acute blood loss anemia 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 He received 1 unit of blood yesterday for a total of 3 units this admission. Hgb is stable this morning at 9.4. He has not had any bowel movements overnight. If he  has some bowel movements with blood likely residual from prior bleed. He did have a polyp that was biopsied with clip placed. -GI following -CBC qd -f/u pathology  Lung Mass Pleural effusions 2.3 cm left lung nodule and complex left pleural effusion. 2.6 cm mass RUL nad 3.0 cm mass lingula. He is undergoing further work-up as outpatient for cancer with Dr. Donovan Kail in [place]. CT chest showed large right pleural effusion with compressive atelectasis.  -diagnostic/therapeutic thoracentesis today -Pleural LDH/ protein -serum LDH/ protein  ESRD HD MWF Received HD 8/12 in AM, but unable to pull volume due to hypotension. Nephrology following, K at 3.6 this AM. Next HD 8/14. - strict I+Os, daily weights -Trend BMP -Replace electrolytes as indicated -Avoid nephrotoxic agents, ensure adequate renal perfusion  HFrEF HTN 7/20 EF at 35-40%. Echo ordered 8/11, pending. Net + 2.8L. He does not make urine. - holding home antihypertensives -Daily weights; strict I/os  Afib No recent Eliquis use. -Telemetry monitoring -currently rate controlled. BB being held due to hypotension  OSA - CPAP QHS  DM - CBG monitoring - sensitive SSI  Hypothyroidism Restart synthroid  GERD - PPI  PVD hx BL BKA -restart home ropinirole -mobilize in bed as able to avoid pressure sores   Best Practice (right click and "Reselect all SmartList Selections" daily)   Diet/type: Regular consistency (see orders) DVT prophylaxis: not indicated in setting of GI bleed GI prophylaxis: N/A Lines: N/A Foley:  N/A Code Status:  full code Last date of multidisciplinary goals of care discussion [spoke w daughter, Ms. Adams 8/12]  Daleen Bo. Ted Goodner, D.O.  Internal Medicine Resident, PGY-2 [facility]  Pager: [phone] 5:51 AM, 05/23/2022

## 2022-05-23 NOTE — Progress Notes (Signed)
[facility] Progress Note Patient Name: Terry Graves. DOB: 1935/06/05 MRN: [id number]   Date of Service  05/23/2022  HPI/Events of Note  Asking for cough meds, non productive. Pleural effusion on scan.   eICU Interventions  Robitussin ordered prn for now     Intervention Category Intermediate Interventions: Other: (non productive cough)  Elmer Sow 05/23/2022, 4:19 AM

## 2022-05-23 NOTE — Progress Notes (Signed)
[facility] Progress Note  Subjective: seen in room, no c/o.   Vitals:   05/23/22 1950 05/23/22 2000 05/23/22 2100 05/23/22 2125  BP:  (!) 91/31 (!) 78/36 (!) 88/35  Pulse:  61 76 71  Resp:  15 18 17   Temp: 97.7 F (36.5 C)     TempSrc: Oral     SpO2:  100% 100% 100%  Weight:        Exam: Gen alert, no distress No jvd or bruits Chest clear bilat to bases RRR no MRG Abd soft ntnd no mass or ascites MS R AKA and L BKA Ext no edema Neuro is alert, Ox 3 , nf    LUA AVF+bruit    Home meds include - atrovastatin, carvedilol 12.5 bid, levothyroxine, MVI, pantoprazole, ropinirole, terazosin, xarelto, prns/ vits / supps    CT chest 8/10 - IMPRESSION: 1. Large right pleural effusion with associated compressive atelectasis of the dependent right lung. 2. Moderate complex left pleural effusion with associated pleural thickening and complex internal architecture which may relate to hemorrhagic or proteinaceous fluid. Super infection, as can be seen with empyema, is not excluded and correlation with clinical laboratory examination is recommended. Diagnostic thoracentesis may be helpful for further evaluation. Associated subtotal collapse of the left lower lobe. 3. Superimposed 2.6 cm mass within the right upper lobe and 3.0 cm mass within the aerated lingula.    OP HD: MWF [place] (> per Care Everywhere)  3h 5min  400/2.0  73kg  2/2.5 bath  LUA AVG   16ga  Hep ? - venofer 50mg  q wk - last hep B labs here: 8/10        Assessment/ Plan: GI bleed - lower GI bleed, active bleeding Dieulafoy lesion in colon, sp clip and epi injection 8/12. SP 3u prbcs total. Hb stable. Per GI.  Shock - due to hemorrhage as above, not on pressors at this time Pleural effusions/ lung mass - by CT. Is being f/b doctor in [place] for lung mass. Per CCM.  ESRD - on HD MWF in [place]. Had HD here Friday night. Next HD Monday.  Volume - euvolemic on exam, at dry wt, on room air. Stable.    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 MBD ckd - CCa in range, phos is low at 2.8.  No binders needed.  Atrial fib - holding blood thinners d/t #1 HTN - holding home coreg PAD / bilat LE amputee    Rob Makesha Belitz 05/23/2022, 9:54 PM   Recent Labs  Lab 05/20/22 1313 05/20/22 2105 05/22/22 1423 05/22/22 1737 05/22/22 2144 05/23/22 0008 05/23/22 0809 05/23/22 1804  HGB 5.9*   < > 8.7*   < >  --   --  9.4* 9.0*  ALBUMIN 2.3*  --   --   --   --   --   --   --   CALCIUM 7.8*   < > 7.4*  --   --  7.2*  --   --   PHOS  --   --  2.8  --   --   --   --   --   CREATININE 2.35*   < > 2.14*  --   --  2.61*  --   --   K 3.1*   < > 3.2*  --  3.3* 3.6  --   --    < > = values in this interval not displayed.  Recent Labs  Lab 05/20/22 1313  IRON 78  TIBC 147*  FERRITIN 1,069*    Inpatient medications:  sodium chloride   Intravenous Once   sodium chloride   Intravenous Once   atorvastatin  40 mg Oral QHS   Chlorhexidine Gluconate Cloth  6 each Topical Daily   Chlorhexidine Gluconate Cloth  6 each Topical Q0600   cyanocobalamin  1,000 mcg Intramuscular Q7 days   insulin aspart  0-6 Units Subcutaneous Q4H   [START ON 05/24/2022] levothyroxine  25 mcg Oral QAC breakfast   midodrine  10 mg Oral TID WC   pantoprazole  40 mg Oral Daily   rOPINIRole  0.5 mg Oral QHS   sodium chloride flush  3 mL Intravenous Q12H    sodium chloride 10 mL/hr at 05/22/22 0000   sodium chloride, docusate sodium, guaiFENesin, ipratropium-albuterol, mouth rinse, polyethylene glycol

## 2022-05-23 NOTE — Progress Notes (Signed)
Pt did not want to wear CPAP HS. No distress at this time.

## 2022-05-23 NOTE — Progress Notes (Addendum)
Attending physician's note   I have taken a history, reviewed the chart, and examined the patient. I performed a substantive portion of this encounter, including complete performance of at least one of the key components, in conjunction with the APP. I agree with the APP's note, impression, and recommendations with my edits.   No further bleeding, H/H stable If needing to resume AC, allow another 24-48 hours before restarting GI service will sign off at this time. Please do not hesitate to contact the inpatient service if concern for rebleeding  Gerrit Heck, DO, FACG [phone] office         Daily Progress Note  Hospital Day: 4  Chief Complaint: rectal bleeding  Brief History Terry Mckimmy. is a 87 y.o. male with a pmh not limited to ESRD on MWF, hypothyroidism, GERD, HFpEF, chronic A-fib on Xarelto, diverticulosis, OSA on CPAP. Admitted 8/10 with hematochezia . Prior to this admission he was hospitalized in [place] with what was thought to be a diverticular bleed. We were consulted on 8/10, please refer to that note.     Assessment / Plan   # Lower GI bleed. Colonoscopy 8/12 >> actively bleeding Dieulafoy lesion in splenic flexure s/p clip placement and epi  injection.  Xarelto on hold. If needs to resume,  it would be preferable to wait until tomorrow or Tuesday if possible  # Anemia of acute blood loss. Hgb stable S/p 2u PRBC 8/10 and 1u on 8/12 Hgb stable overnight at 9.4   Subjective   Feels better after thoracentesis done earlier this am. Had BM today, no blood per RN  Objective   Endoscopic studies:  Colonoscopy 8/12 Blood in the entire examined colon. This was lavaged. Located an actively bleeding Dieulafoy lesion in the splenic flexure. This was briskly bleeding. The surrounding mucosa was normal appearing. For hemostasis, four hemostatic clips were successfully placed (MR conditional) with cessation of bleeding. Area was then successfully  injected with 3 mL of a1:10,000 solution of epinephrine for dual hemostasis. There was no bleeding at the end of the procedure. - One 6 mm polyp at the appendiceal orifice, removed with a cold snare. Resected and retrieved. Clip (MR conditional) was placed. - Diverticulosis in the sigmoid colon and in the descending colon. - Non-bleeding internal hemorrhoids. - The examined portion of the ileum was normal.  Imaging:   CT CHEST WO CONTRAST  Result Date: 05/20/2022 CLINICAL DATA:  Lung nodule, > 75mm Left lung nodule EXAM: CT CHEST WITHOUT CONTRAST TECHNIQUE: Multidetector CT imaging of the chest was performed following the standard protocol without IV contrast. RADIATION DOSE REDUCTION: This exam was performed according to the departmental dose-optimization program which includes automated exposure control, adjustment of the mA and/or kV according to patient size and/or use of iterative reconstruction technique. COMPARISON:  None Available. FINDINGS: Cardiovascular: Extensive multi-vessel coronary artery calcification. Global cardiac size is within normal limits. No pericardial effusion. Central pulmonary arteries are enlarged in keeping with changes of pulmonary arterial hypertension. Extensive atherosclerotic calcification within the thoracic aorta. Mild dilation of the ascending thoracic aorta which measures 4.0 cm in greatest dimension. Descending thoracic aorta is of normal caliber. Vascular stent is partially visualized within the left cephalic arch. Mediastinum/Nodes: Visualized thyroid is unremarkable. No pathologic thoracic adenopathy. Esophagus is unremarkable. Lungs/Pleura: Large right pleural effusion is present with compressive atelectasis of the dependent right lung. There is a superimposed 2.6 x 1.9 cm mass identified within the right upper lobe which is difficult  to accurately measure on this noncontrast examination with adjacent atelectatic lung. On the left, a complex moderate left  pleural effusion is again identified within the posterior basal left hemithorax with associated visceral and parietal pleural thickening and complex internal architecture which may relate to hemorrhagic or proteinaceous fluid. There is associated subtotal collapse of the left lower lobe. Within the aerated lingula, a 1.7 x 3.0 cm rounded mass is again identified. This may represent rounded atelectasis, however, underlying neoplastic mass is not excluded. No pneumothorax. No central obstructing lesion. Upper Abdomen: No acute abnormality within the visualized upper abdomen. This is better assessed on CTA examination performed earlier. Musculoskeletal: No acute bone abnormality. No lytic or blastic bone lesion. Multiple healed left rib fractures are noted. The osseous structures are diffusely osteopenic. IMPRESSION: 1. Large right pleural effusion with associated compressive atelectasis of the dependent right lung. 2. Moderate complex left pleural effusion with associated pleural thickening and complex internal architecture which may relate to hemorrhagic or proteinaceous fluid. Super infection, as can be seen with empyema, is not excluded and correlation with clinical laboratory examination is recommended. Diagnostic thoracentesis may be helpful for further evaluation. Associated subtotal collapse of the left lower lobe. 3. Superimposed 2.6 cm mass within the right upper lobe and 3.0 cm mass within the aerated lingula. In absence of prior examinations to determine acuity, PET CT examination is recommended for further evaluation, if clinically indicated. 4. Extensive multi-vessel coronary artery calcification. 5. Mild dilation of the ascending thoracic aorta which measures 4.0 cm in greatest dimension. Recommend annual imaging followup by CTA or MRA. This recommendation follows 2010 ACCF/AHA/AATS/ACR/ASA/SCA/SCAI/SIR/STS/SVM Guidelines for the Diagnosis and Management of Patients with Thoracic Aortic Disease.  Circulation. 2010; 121: Z610-R604. Aortic aneurysm NOS (ICD10-I71.9) 6. Enlarged central pulmonary arteries in keeping with changes of pulmonary arterial hypertension. Aortic Atherosclerosis (ICD10-I70.0). Electronically Signed   By: Fidela Salisbury M.D.   On: 05/20/2022 22:58   CT Angio Abd/Pel W and/or Wo Contrast  Result Date: 05/20/2022 CLINICAL DATA:  87 year old male with a history of lower GI bleeding EXAM: CTA ABDOMEN AND PELVIS WITHOUT AND WITH CONTRAST TECHNIQUE: Multidetector CT imaging of the abdomen and pelvis was performed using the standard protocol during bolus administration of intravenous contrast. Multiplanar reconstructed images and MIPs were obtained and reviewed to evaluate the vascular anatomy. RADIATION DOSE REDUCTION: This exam was performed according to the departmental dose-optimization program which includes automated exposure control, adjustment of the mA and/or kV according to patient size and/or use of iterative reconstruction technique. CONTRAST:  130mL OMNIPAQUE IOHEXOL 350 MG/ML SOLN COMPARISON:  CT 04/24/2019 FINDINGS: VASCULAR Aorta: Mild atherosclerotic changes of the lower thoracic aorta and the abdominal aorta. No pedunculated plaque, ulcerated plaque, dissection. No aneurysm. No periaortic fluid or inflammatory changes. Celiac: Celiac artery patent.  Branches patent. SMA: SMA patent with mild atherosclerosis at the origin and estimated less than 50% narrowing. Branches are patent. Renals: - Right: Single right renal artery. Atherosclerotic changes at the origin. Estimated 50% narrowing secondary to calcified plaque. - Left: Single left renal artery. Atherosclerotic changes at the origin with at least 50% narrowing secondary to calcified plaque. IMA: Inferior mesenteric artery is patent. Right lower extremity: Mild tortuosity of the right iliac system. Mild atherosclerotic changes without evidence of high-grade stenosis of the common iliac artery or external iliac artery.  Hypogastric artery is patent. Pelvic arteries patent. Common femoral artery patent with mild atherosclerosis. Mixed calcified and soft plaque in the proximal right superficial femoral artery, which appears occluded after  the origin. Profunda femoris patent. Left lower extremity: Tortuosity of the left iliac system. Mild atherosclerosis without high-grade stenosis or occlusion of the common iliac artery and external iliac artery. Hypogastric artery is patent as well as pelvic arteries. Common femoral artery patent with mild atherosclerosis. Proximal profunda femoris and SFA patent. Veins: Unremarkable appearance of the venous system. Review of the MIP images confirms the above findings. NON-VASCULAR Lower chest: Bilateral pleural effusions at the lung bases. Thickening of the left-sided visceral and parietal pleura, new from the prior of 04/24/2019. Nodule at the left lung base measures 2.3 cm, new from the comparison. Atelectasis bilateral lung bases. Hepatobiliary: Unremarkable appearance of the liver. Calcified stones within the gallbladder lumen. Pancreas: Rounded hypodense lesion within the pancreas body, levin mm, new from the comparison. No inflammatory changes of pancreas. Spleen: Unremarkable. Adrenals/Urinary Tract: - Right adrenal gland: Unremarkable - Left adrenal gland: Unremarkable. - Right kidney: No hydronephrosis. Small nonobstructing stones in the calyceal tips. Unremarkable right ureter. - Left Kidney: No hydronephrosis. Nonobstructing stones at the calyceal tips. Unremarkable course of the left ureter. - Urinary Bladder: Bladder is contracted. Stomach/Bowel: - Stomach: Unremarkable. - Small bowel: Small bowel decompressed with no transition point. No inflammatory changes. No puddling of contrast. - Appendix: Normal. - Colon: Colon decompressed with no transition point. No puddling of contrast. No wall thickening. No inflammatory changes. Left-sided diverticular disease. No transition point.  Lymphatic: No adenopathy. Mesenteric: Mild edema within the mesentery. No free fluid or significant ascites. Reproductive: Unremarkable Other: Body wall edema/anasarca Musculoskeletal: Osteopenia. New compression fractures of L2 and L4. No significant height loss at L2. Approximately 50% height loss at L4. No significant retropulsion of the posterior endplates. Progression of the L1 compression fracture with approximately 50% height loss, progressed from the comparison. No bony canal narrowing. Degenerative changes of the hips. IMPRESSION: CT angiogram negative for evidence of acute gastrointestinal hemorrhage. Bilateral pleural effusions. The left side demonstrates pleural thickening of the visceral and parietal pleura, and empyema cannot be excluded. Given this finding as well as the presence of an indeterminate 2.3 cm lung nodule on the left, complete chest CT imaging is recommended. Mild mesenteric and body wall edema/anasarca, suggesting fluid positive state. No overt pulmonary edema or ascites. Multilevel peripheral arterial disease, including mild aortic atherosclerosis, bilateral iliac atherosclerosis, and proximal femoral arterial disease in this patient with history of hemodialysis. Likely proximal right SFA occlusion. Aortic Atherosclerosis (ICD10-I70.0). 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 New compression fractures of L2 and L4, as above. There has been progression of prior L1 compression fracture. If symptomatic, further evaluation with MR lumbar spine may be considered to determine acuity/chronicity. Additional ancillary findings as above. Signed, Dulcy Fanny. Nadene Rubins, RPVI [facility] Radiology Electronically Signed   By: Corrie Mckusick D.O.   On: 05/20/2022 16:32    Lab Results: Recent Labs    05/22/22 1423 05/22/22 1737 05/23/22 0809  WBC 6.0 6.8 9.5  HGB 8.7* 9.4* 9.4*  HCT  26.1* 27.4* 27.9*  PLT 131* 134* 139*   BMET Recent Labs    05/22/22 0018 05/22/22 1423 05/22/22 2144 05/23/22 0008  NA 137 136  --  133*  K 5.1 3.2* 3.3* 3.6  CL 102 99  --  98  CO2 25 30  --  27  GLUCOSE 105* 98  --  82  BUN 27* 12  --  16  CREATININE 3.60* 2.14*  --  2.61*  CALCIUM 7.7* 7.4*  --  7.2*   LFT Recent Labs    05/20/22 1313  PROT 5.1*  ALBUMIN 2.3*  AST 13*  ALT 10  ALKPHOS 101  BILITOT 0.6   PT/INR Recent Labs    05/20/22 1448 05/22/22 0212  LABPROT 16.5* 15.8*  INR 1.4* 1.3*     Scheduled inpatient medications:   sodium chloride   Intravenous Once   sodium chloride   Intravenous Once   atorvastatin  40 mg Oral QHS   Chlorhexidine Gluconate Cloth  6 each Topical Daily   Chlorhexidine Gluconate Cloth  6 each Topical Q0600   cyanocobalamin  1,000 mcg Intramuscular Q7 days   insulin aspart  0-6 Units Subcutaneous Q4H   [START ON 05/24/2022] levothyroxine  25 mcg Oral QAC breakfast   pantoprazole (PROTONIX) IV  40 mg Intravenous Daily   rOPINIRole  0.5 mg Oral QHS   sodium chloride flush  3 mL Intravenous Q12H   Continuous inpatient infusions:   sodium chloride 10 mL/hr at 05/22/22 0000   PRN inpatient medications: sodium chloride, docusate sodium, guaiFENesin, ipratropium-albuterol, mouth rinse, polyethylene glycol  Vital signs in last 24 hours: Temp:  [97.2 F (36.2 C)-98.2 F (36.8 C)] 97.4 F (36.3 C) (08/13 0740) Pulse Rate:  [66-101] 85 (08/13 0600) Resp:  [10-27] 25 (08/13 0600) BP: (60-117)/(29-71) 96/36 (08/13 0600) SpO2:  [92 %-100 %] 98 % (08/13 0600) Last BM Date : 05/22/22  Intake/Output Summary (Last 24 hours) at 05/23/2022 1027 Last data filed at 05/23/2022 0600 Gross per 24 hour  Intake 3423.18 ml  Output 0 ml  Net 3423.18 ml     Physical Exam:  General: Alert male in NAD Heart:  Regular rate   Pulmonary: Normal respiratory effort Abdomen: Soft, nondistended, nontender. Normal bowel sounds.  Neurologic: Alert  and oriented Psych: Pleasant. Cooperative.    Intake/Output from previous day: 08/12 0701 - 08/13 0700 In: 3596.2 [P.O.:500; I.V.:1629.5; Blood:315; IV Piggyback:1151.7] Out: 0  Intake/Output this shift: No intake/output data recorded.    Principal Problem:   GI bleed Active Problems:   Diverticulosis of colon with hemorrhage   AVM (arteriovenous malformation) of small bowel, acquired   Acute on chronic anemia   Anemia due to vitamin B12 deficiency   Grade II internal hemorrhoids   Dieulafoy lesion of colon   Hematochezia   Cecal polyp     LOS: 3 days   Tye Savoy ,NP 05/23/2022, 10:27 AM

## 2022-05-24 DIAGNOSIS — N186 End stage renal disease: Secondary | ICD-10-CM | POA: Diagnosis not present

## 2022-05-24 DIAGNOSIS — D62 Acute posthemorrhagic anemia: Secondary | ICD-10-CM

## 2022-05-24 DIAGNOSIS — K5520 Angiodysplasia of colon without hemorrhage: Secondary | ICD-10-CM

## 2022-05-24 DIAGNOSIS — I959 Hypotension, unspecified: Secondary | ICD-10-CM | POA: Diagnosis not present

## 2022-05-24 DIAGNOSIS — K922 Gastrointestinal hemorrhage, unspecified: Secondary | ICD-10-CM | POA: Diagnosis not present

## 2022-05-24 LAB — TYPE AND SCREEN
ABO/RH(D): A POS
Antibody Screen: NEGATIVE
Unit division: 0
Unit division: 0
Unit division: 0
Unit division: 0

## 2022-05-24 LAB — BPAM RBC
Blood Product Expiration Date: 202308242359
Blood Product Expiration Date: 202308282359
Blood Product Expiration Date: 202308282359
Blood Product Expiration Date: 202309012359
ISSUE DATE / TIME: 202308101434
ISSUE DATE / TIME: 202308101703
ISSUE DATE / TIME: 202308120825
Unit Type and Rh: 6200
Unit Type and Rh: 6200
Unit Type and Rh: 6200
Unit Type and Rh: 6200

## 2022-05-24 LAB — CBC
HCT: 26.3 % — ABNORMAL LOW (ref 39.0–52.0)
Hemoglobin: 8.6 g/dL — ABNORMAL LOW (ref 13.0–17.0)
MCH: 30.9 pg (ref 26.0–34.0)
MCHC: 32.7 g/dL (ref 30.0–36.0)
MCV: 94.6 fL (ref 80.0–100.0)
Platelets: 133 10*3/uL — ABNORMAL LOW (ref 150–400)
RBC: 2.78 MIL/uL — ABNORMAL LOW (ref 4.22–5.81)
RDW: 16.9 % — ABNORMAL HIGH (ref 11.5–15.5)
WBC: 8.6 10*3/uL (ref 4.0–10.5)
nRBC: 0 % (ref 0.0–0.2)

## 2022-05-24 LAB — BASIC METABOLIC PANEL
Anion gap: 7 (ref 5–15)
BUN: 19 mg/dL (ref 8–23)
CO2: 28 mmol/L (ref 22–32)
Calcium: 7.3 mg/dL — ABNORMAL LOW (ref 8.9–10.3)
Chloride: 95 mmol/L — ABNORMAL LOW (ref 98–111)
Creatinine, Ser: 3.5 mg/dL — ABNORMAL HIGH (ref 0.61–1.24)
GFR, Estimated: 16 mL/min — ABNORMAL LOW (ref >60)
Glucose, Bld: 132 mg/dL — ABNORMAL HIGH (ref 70–99)
Potassium: 3.4 mmol/L — ABNORMAL LOW (ref 3.5–5.1)
Sodium: 130 mmol/L — ABNORMAL LOW (ref 135–145)

## 2022-05-24 LAB — GLUCOSE, CAPILLARY
Glucose-Capillary: 121 mg/dL — ABNORMAL HIGH (ref 70–99)
Glucose-Capillary: 96 mg/dL (ref 70–99)
Glucose-Capillary: 126 mg/dL — ABNORMAL HIGH (ref 70–99)
Glucose-Capillary: 121 mg/dL — ABNORMAL HIGH (ref 70–99)
Glucose-Capillary: 134 mg/dL — ABNORMAL HIGH (ref 70–99)

## 2022-05-24 LAB — LACTIC ACID, PLASMA
Lactic Acid, Venous: 1 mmol/L (ref 0.5–1.9)
Lactic Acid, Venous: 1.1 mmol/L (ref 0.5–1.9)

## 2022-05-24 LAB — TSH: TSH: 4.151 u[IU]/mL (ref 0.350–4.500)

## 2022-05-24 LAB — CYTOLOGY - NON PAP

## 2022-05-24 LAB — CORTISOL: Cortisol, Plasma: 16.5 ug/dL

## 2022-05-24 MED ORDER — CALCIUM GLUCONATE-NACL 1-0.675 GM/50ML-% IV SOLN
1.0000 g | Freq: Once | INTRAVENOUS | Status: DC
  Filled 2022-05-24: qty 50

## 2022-05-24 MED ORDER — BENZONATATE 100 MG PO CAPS
100.0000 mg | ORAL_CAPSULE | Freq: Three times a day (TID) | ORAL | Status: DC | PRN
  Administered 2022-05-24 – 2022-05-30 (×6): 100 mg via ORAL
  Filled 2022-05-24 (×7): qty 1

## 2022-05-24 MED ORDER — POTASSIUM CHLORIDE CRYS ER 20 MEQ PO TBCR
20.0000 meq | EXTENDED_RELEASE_TABLET | Freq: Once | ORAL | Status: AC
  Administered 2022-05-24: 20 meq via ORAL
  Filled 2022-05-24: qty 1

## 2022-05-24 MED ORDER — MIDODRINE HCL 5 MG PO TABS
15.0000 mg | ORAL_TABLET | Freq: Three times a day (TID) | ORAL | Status: DC
  Administered 2022-05-25 (×3): 15 mg via ORAL
  Filled 2022-05-24 (×3): qty 3

## 2022-05-24 NOTE — Progress Notes (Signed)
PT STABLE ALERT BP RECHECKED 84/52 90HR

## 2022-05-24 NOTE — Progress Notes (Signed)
PT STABLE FOR HD IN ROOM DUE TO ICU STATUS UFG 0 VIA AVG +/+

## 2022-05-24 NOTE — Progress Notes (Signed)
[facility] Progress Note  Subjective: patient seen and examined bedside in room. Reported some urinary urgency which is new (will check UA). Otherwise no other complaints.  Vitals:   05/24/22 0500 05/24/22 0600 05/24/22 0700 05/24/22 0750  BP:  (!) 94/46 (!) 88/48   Pulse:  80 74   Resp:  19 16   Temp:    (!) 97.3 F (36.3 C)  TempSrc:    Axillary  SpO2:  100% 95%   Weight: 74.9 kg      Exam: Gen: NAD, laying flat in bed CV: RRR Resp: CTA BL Abd: soft, nt/nd Ext: no sig thigh edema b/l, rt aka, lt bka Neuro: awake, alert Dialysis access: LUE AVF +b/t   CT chest 8/10 - IMPRESSION: 1. Large right pleural effusion with associated compressive atelectasis of the dependent right lung. 2. Moderate complex left pleural effusion with associated pleural thickening and complex internal architecture which may relate to hemorrhagic or proteinaceous fluid. Super infection, as can be seen with empyema, is not excluded and correlation with clinical laboratory examination is recommended. Diagnostic thoracentesis may be helpful for further evaluation. Associated subtotal collapse of the left lower lobe. 3. Superimposed 2.6 cm mass within the right upper lobe and 3.0 cm mass within the aerated lingula.    Recent Labs  Lab 05/20/22 1313 05/20/22 2105 05/22/22 1423 05/22/22 1737 05/23/22 0008 05/23/22 0809 05/23/22 1804 05/24/22 0036  HGB 5.9*   < > 8.7*   < >  --  9.4* 9.0*  --   ALBUMIN 2.3*  --   --   --   --   --   --   --   CALCIUM 7.8*   < > 7.4*  --  7.2*  --   --  7.3*  PHOS  --   --  2.8  --   --   --   --   --   CREATININE 2.35*   < > 2.14*  --  2.61*  --   --  3.50*  K 3.1*   < > 3.2*   < > 3.6  --   --  3.4*   < > = values in this interval not displayed.   Recent Labs  Lab 05/20/22 1313  IRON 78  TIBC 147*  FERRITIN 1,069*   Inpatient medications:  sodium chloride   Intravenous Once   sodium chloride   Intravenous Once   atorvastatin  40 mg Oral QHS    Chlorhexidine Gluconate Cloth  6 each Topical Daily   Chlorhexidine Gluconate Cloth  6 each Topical Q0600   Chlorhexidine Gluconate Cloth  6 each Topical Q0600   cyanocobalamin  1,000 mcg Intramuscular Q7 days   insulin aspart  0-6 Units Subcutaneous Q4H   levothyroxine  25 mcg Oral QAC breakfast   midodrine  10 mg Oral TID WC   pantoprazole  40 mg Oral Daily   rOPINIRole  0.5 mg Oral QHS   sodium chloride flush  3 mL Intravenous Q12H    sodium chloride 10 mL/hr at 05/22/22 0000   sodium chloride, docusate sodium, guaiFENesin, ipratropium-albuterol, mouth rinse, polyethylene glycol    OP HD: MWF [place] (> per Care Everywhere)  3h 80min  400/2.0  73kg  2/2.5 bath  LUA AVG   16ga  Hep ? - venofer 50mg  q wk - last hep B labs here: 8/10    Assessment/ Plan: GI bleed - lower GI bleed, active bleeding Dieulafoy lesion in colon, sp  clip and epi injection 8/12. SP 3u prbcs total. Hb stable. GI signed off Shock - due to hemorrhage as above, not on pressors at this time Pleural effusions/ lung mass - by CT. Is being f/b doctor in [place] for lung mass. Per CCM. S/p thora 8/13-850cc drained ESRD - on HD MWF in [place]. Will c/w MWF schedule Anemia esrd - Hb 5.9 on admission, up to low 9s after transfusion. MBD ckd - CCa in range, phos is low at 2.8.  No binders needed.  Atrial fib - holding blood thinners d/t #1, resumption per primary HTN/volume - holding home coreg. Volume status acceptable today Urinary urgency- will check UA

## 2022-05-24 NOTE — Progress Notes (Signed)
[facility] Progress Note Patient Name: Terry Graves. DOB: November 21, 1934 MRN: [id number]   Date of Service  05/24/2022  HPI/Events of Note  Notified of K 3.4, ESRD on HD  eICU Interventions  Ordered K 20 meqs Further correction as per nephrology     Intervention Category Intermediate Interventions: Electrolyte abnormality - evaluation and management  Judd Lien 05/24/2022, 3:41 AM

## 2022-05-24 NOTE — Progress Notes (Signed)
Daughter updated by phone.

## 2022-05-24 NOTE — Progress Notes (Signed)
NAME:  Terry Graves., MRN:  [id number], DOB:  [birth year], LOS: 0 ADMISSION DATE:  05/20/2022, CONSULTATION DATE:  8/10 REFERRING MD:  Dr. Billy Fischer, CHIEF COMPLAINT:  GI bleed   History of Present Illness:  Patient is a 87 year old male with pertinent PMH of ESRD (dialysis M, W, F), hypothyroidism, GERD, HFpEF, chronic A-fib on Xarelto (off Xarelto since 7/26), OSA on CPAP, recent GI bleed presents to [facility] ED on 8/10 with GI bleed.  Patient was recently admitted to [facility] on 7/26.  Noted to have colonic diverticulitis and small bowel AVM bleed.  Home Xarelto was held.  Patient given PRBC for low hemoglobin.  Patient was discharged about a week ago and to follow-up with GI outpatient.  Since discharge patient has still had persistent bleeding with BM about a cup of blood per day.  States he really has not had no hematemesis, maybe once.  No heavy EtOH abuse.  Denies fevers, SOB, chest pain.  Does have some elbow pain and LLQ with bowel movements.   Patient presents to [facility] on 8/10 with GI bleed.  Patient mildly hypotensive, otherwise hemodynamically stable.  Afebrile.  On room air.  Initial Hgb 5.9.  Given 2 units PRBCs.  Patient's MAP less than 65 despite PRBCs. K 3.1, creat 2.35 (close to baseline), WBC 4.9.  PCCM consulted for ICU admission.  Pertinent  Medical History   Past Medical History:  Diagnosis Date   Anemia    Arthritis    Atrial fibrillation (HCC)    CHF (congestive heart failure) (HCC)    Chronic kidney disease-mineral and bone disorder    ESRD on hemodialysis ([place])    takes HD in [place] on MWF schedule   Headache    HOH (hard of hearing)    Hyperlipidemia    Hypertension    Hypothyroidism    Peripheral vascular disease (HCC)    Proteinuria    Sleep apnea    wears CPAP 12   Type 2 diabetes mellitus with stage 5 chronic kidney disease ([place])    Wears glasses     Significant Hospital Events: Including procedures, antibiotic start and  stop dates in addition to other pertinent events   8/10 Admitted to [facility] ICU and transfused 2 units 8/12 7-10 bloody bowel movements, prep received yesterday for planned colonoscopy today 8/13 R thoracentesis  Interim History / Subjective:  MAPs 50s-60s overnight Bowel movement with small amount of blood yesterday afternoon.  He reports improvement in breathing this AM. He had PET scan in [place] and reports that's that is was positive. He is not sure what that means.   Objective   Blood pressure (!) 94/46, pulse 80, temperature 97.6 F (36.4 C), temperature source Oral, resp. rate 19, weight 74.9 kg, SpO2 100 %.        Intake/Output Summary (Last 24 hours) at 05/24/2022 0630 Last data filed at 05/23/2022 2100 Gross per 24 hour  Intake 1300 ml  Output 850 ml  Net 450 ml    Filed Weights   05/21/22 0500 05/21/22 2000 05/24/22 0500  Weight: 73.1 kg 73 kg 74.9 kg    Examination: General: Elderly male, in no acute distress HEENT: MM pink/moist Neuro: Aox3; MAE CV: irregularly irregular, rate 80s, no m/r/g PULM:  decreased breath sounds to lungs bases bilaterally, no wheezing or rhonchi, breathing comfortably on room air GI: soft, bsx4 active, no tenderness Extremities: warm/dry, L BKA, R AKA; LUE fistula without erythema and palpable thrill Skin: no  rashes or lesions appreciated  Na 133-> 130 K 3.4, he received 36meq K BUN 19 Hgb 9 Platelets 131 Net + 7L  Pleural protein/ serum ratio 0.6 Pleural LDH/ serum LDH 0.39 LDH > 2/3 upper limit of normal Mononuclear cells   Resolved Hospital Problem list   Acute blood loss anemia  Assessment & Plan:   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 He received 1 unit of blood yesterday for a total of 3 units this admission. No further episodes of blood  in stool. -restart eliquis 8/15 -GI signed off -f/u polyp pathology -CBC qd  Right Lung Mass Exudative Pleural Effusion 2.6 cm Mass in right upper lobe and 3 cm in right lingula, 2.3 cm left lung nodule. Right sided Thoracentesis completed with 850 cc drained  8/13 and LDH/ protein consistent with exudative effusion. He is undergoing further work-up as outpatient with Dr. Donovan Kail in [place]. His daughter is concerned about delay in work-up as he was referred to a different pulmonologist in [place] who will not be back in town until September to October.  -cytology pending  ESRD HD MWF HD today per nephro. Blood pressure have been low overnight.  - strict I+Os, daily weights -Trend BMP -Replace electrolytes as indicated -Avoid nephrotoxic agents, ensure adequate renal perfusion  HFrEF HTN 7/20 EF at 35-40%. Echo ordered 8/11, pending. Net + 7L. He does not make urine. - holding home antihypertensives -Daily weights; strict I/os  Afib No recent Eliquis use. -Telemetry monitoring -currently rate controlled. BB being held due to hypotension -restart eliquis 8/15  OSA - CPAP QHS  DM - CBG monitoring - sensitive SSI  Hypothyroidism Restart synthroid.  GERD - PPI  PVD hx BL BKA -restart home ropinirole -mobilize in bed as able to avoid pressure sores   Best Practice (right click and "Reselect all SmartList Selections" daily)   Diet/type: Regular consistency (see orders) DVT prophylaxis: not indicated, GI bleed this admission plan to restart DOAC 8/15 GI prophylaxis: N/A Lines: N/A Foley:  N/A Code Status:  full code Last date of multidisciplinary goals of care discussion [spoke w daughter, Ms. Adams 8/13]  Daleen Bo. Aasia Peavler, D.O.  Internal Medicine Resident, PGY-2 [facility]  Pager: [phone] 6:30 AM, 05/24/2022

## 2022-05-24 NOTE — Progress Notes (Signed)
TX INITIATED W/O DIFFICULTY CANNULATED SUCCESSFULLY 16g X2 BFR ACHIEVED UFG KEEP EVEN

## 2022-05-24 NOTE — Progress Notes (Signed)
Received patient in bed to unit.  Alert and oriented.  Informed consent signed and in  chart.   Treatment initiated: 1505  Treatment completed: 1850  Patient tolerated well.  Transported back to the room  alert, without acute distress.  Hand-off given to patient's nurse.   Access used: avf Access issues: none  Total UF removed: 0 Medication(s) given: none Post HD VS: stable see flowsheet Post HD weight:     [facility]

## 2022-05-25 ENCOUNTER — Inpatient Hospital Stay (HOSPITAL_COMMUNITY): Payer: Medicare Other

## 2022-05-25 DIAGNOSIS — D649 Anemia, unspecified: Secondary | ICD-10-CM | POA: Diagnosis not present

## 2022-05-25 DIAGNOSIS — E876 Hypokalemia: Secondary | ICD-10-CM

## 2022-05-25 DIAGNOSIS — I959 Hypotension, unspecified: Secondary | ICD-10-CM | POA: Diagnosis not present

## 2022-05-25 DIAGNOSIS — D696 Thrombocytopenia, unspecified: Secondary | ICD-10-CM | POA: Diagnosis not present

## 2022-05-25 DIAGNOSIS — D62 Acute posthemorrhagic anemia: Secondary | ICD-10-CM | POA: Diagnosis not present

## 2022-05-25 LAB — CBC
HCT: 27.8 % — ABNORMAL LOW (ref 39.0–52.0)
Hemoglobin: 9.2 g/dL — ABNORMAL LOW (ref 13.0–17.0)
MCH: 31.2 pg (ref 26.0–34.0)
MCHC: 33.1 g/dL (ref 30.0–36.0)
MCV: 94.2 fL (ref 80.0–100.0)
Platelets: 143 10*3/uL — ABNORMAL LOW (ref 150–400)
RBC: 2.95 MIL/uL — ABNORMAL LOW (ref 4.22–5.81)
RDW: 17.4 % — ABNORMAL HIGH (ref 11.5–15.5)
WBC: 7.2 10*3/uL (ref 4.0–10.5)
nRBC: 0 % (ref 0.0–0.2)

## 2022-05-25 LAB — BASIC METABOLIC PANEL
Anion gap: 8 (ref 5–15)
BUN: 10 mg/dL (ref 8–23)
CO2: 29 mmol/L (ref 22–32)
Calcium: 7.6 mg/dL — ABNORMAL LOW (ref 8.9–10.3)
Chloride: 96 mmol/L — ABNORMAL LOW (ref 98–111)
Creatinine, Ser: 2.46 mg/dL — ABNORMAL HIGH (ref 0.61–1.24)
GFR, Estimated: 25 mL/min — ABNORMAL LOW (ref >60)
Glucose, Bld: 115 mg/dL — ABNORMAL HIGH (ref 70–99)
Potassium: 3.3 mmol/L — ABNORMAL LOW (ref 3.5–5.1)
Sodium: 133 mmol/L — ABNORMAL LOW (ref 135–145)

## 2022-05-25 LAB — ACTH STIMULATION, 3 TIME POINTS
Cortisol, 30 Min: 24.6 ug/dL
Cortisol, 60 Min: 32.7 ug/dL
Cortisol, Base: 15.5 ug/dL

## 2022-05-25 LAB — GLUCOSE, CAPILLARY
Glucose-Capillary: 163 mg/dL — ABNORMAL HIGH (ref 70–99)
Glucose-Capillary: 107 mg/dL — ABNORMAL HIGH (ref 70–99)
Glucose-Capillary: 128 mg/dL — ABNORMAL HIGH (ref 70–99)
Glucose-Capillary: 155 mg/dL — ABNORMAL HIGH (ref 70–99)
Glucose-Capillary: 165 mg/dL — ABNORMAL HIGH (ref 70–99)
Glucose-Capillary: 232 mg/dL — ABNORMAL HIGH (ref 70–99)

## 2022-05-25 LAB — SURGICAL PATHOLOGY

## 2022-05-25 LAB — HEPARIN LEVEL (UNFRACTIONATED): Heparin Unfractionated: 0.15 IU/mL — ABNORMAL LOW (ref 0.30–0.70)

## 2022-05-25 LAB — MAGNESIUM: Magnesium: 1.5 mg/dL — ABNORMAL LOW (ref 1.7–2.4)

## 2022-05-25 MED ORDER — VANCOMYCIN HCL 750 MG/150ML IV SOLN
750.0000 mg | INTRAVENOUS | Status: DC
  Filled 2022-05-25: qty 150

## 2022-05-25 MED ORDER — VANCOMYCIN HCL 1500 MG/300ML IV SOLN
1500.0000 mg | Freq: Once | INTRAVENOUS | Status: AC
  Administered 2022-05-25: 1500 mg via INTRAVENOUS
  Filled 2022-05-25: qty 300

## 2022-05-25 MED ORDER — POTASSIUM CHLORIDE 20 MEQ PO PACK
20.0000 meq | PACK | Freq: Once | ORAL | Status: AC
  Administered 2022-05-25: 20 meq via ORAL
  Filled 2022-05-25: qty 1

## 2022-05-25 MED ORDER — COSYNTROPIN 0.25 MG IJ SOLR
0.2500 mg | Freq: Once | INTRAMUSCULAR | Status: AC
  Administered 2022-05-25: 0.25 mg via INTRAVENOUS
  Filled 2022-05-25: qty 0.25

## 2022-05-25 MED ORDER — DARBEPOETIN ALFA 60 MCG/0.3ML IJ SOSY
60.0000 ug | PREFILLED_SYRINGE | INTRAMUSCULAR | Status: DC
  Administered 2022-05-26: 60 ug via INTRAVENOUS
  Filled 2022-05-25: qty 0.3

## 2022-05-25 MED ORDER — METRONIDAZOLE 500 MG/100ML IV SOLN
500.0000 mg | Freq: Two times a day (BID) | INTRAVENOUS | Status: DC
  Administered 2022-05-25 – 2022-05-27 (×4): 500 mg via INTRAVENOUS
  Filled 2022-05-25 (×5): qty 100

## 2022-05-25 MED ORDER — CEFEPIME HCL 2 G IV SOLR
2.0000 g | Freq: Once | INTRAVENOUS | Status: AC
  Administered 2022-05-25: 2 g via INTRAVENOUS
  Filled 2022-05-25: qty 12.5

## 2022-05-25 MED ORDER — MAGNESIUM SULFATE 2 GM/50ML IV SOLN
2.0000 g | Freq: Once | INTRAVENOUS | Status: AC
  Administered 2022-05-25: 2 g via INTRAVENOUS
  Filled 2022-05-25: qty 50

## 2022-05-25 MED ORDER — IOHEXOL 350 MG/ML SOLN
80.0000 mL | Freq: Once | INTRAVENOUS | Status: AC | PRN
  Administered 2022-05-25: 80 mL via INTRAVENOUS

## 2022-05-25 MED ORDER — NOREPINEPHRINE 4 MG/250ML-% IV SOLN: 0.0000 ug/min | INTRAVENOUS | Status: DC

## 2022-05-25 MED ORDER — SODIUM CHLORIDE 0.9 % IV SOLN
2.0000 g | INTRAVENOUS | Status: DC
  Administered 2022-05-26: 2 g via INTRAVENOUS
  Filled 2022-05-25: qty 12.5

## 2022-05-25 MED ORDER — HEPARIN (PORCINE) 25000 UT/250ML-% IV SOLN
1250.0000 [IU]/h | INTRAVENOUS | Status: AC
  Administered 2022-05-25: 1050 [IU]/h via INTRAVENOUS
  Administered 2022-05-26 – 2022-05-27 (×2): 1250 [IU]/h via INTRAVENOUS
  Filled 2022-05-25 (×3): qty 250

## 2022-05-25 MED ORDER — ALBUMIN HUMAN 5 % IV SOLN
12.5000 g | Freq: Once | INTRAVENOUS | Status: AC
Start: 2022-05-25 — End: 2022-05-25
  Administered 2022-05-25: 12.5 g via INTRAVENOUS
  Filled 2022-05-25: qty 250

## 2022-05-25 MED ORDER — COSYNTROPIN 0.25 MG IJ SOLR: 0.2500 mg | Freq: Once | INTRAMUSCULAR | Status: DC

## 2022-05-25 NOTE — Progress Notes (Signed)
Pharmacy Antibiotic Note  Terry Graves. is a 87 y.o. male admitted on 05/20/2022 with  GIB . Pharmacy has been consulted for vancomycin and cefepime dosing with concerns for aspiration and worsening pulmonary effusion. Pt is ESRD on HD MWF - last was 8/14.  Plan: Cefepime 2g IV x1 then 2g qHD MWF Vancomycin 1500mg  IV x1 then 750mg  qHD MWF Metronidazole 500mg  IV q12h   Height: 5\' 9"  (175.3 cm) Weight: 74.7 kg (164 lb 10.9 oz) IBW/kg (Calculated) : 70.7  Temp (24hrs), Avg:98 F (36.7 C), Min:97.5 F (36.4 C), Max:98.6 F (37 C)  Recent Labs  Lab 05/22/22 0018 05/22/22 0218 05/22/22 1423 05/22/22 1737 05/23/22 0008 05/23/22 0809 05/23/22 1804 05/24/22 0036 05/24/22 0949 05/24/22 1142 05/25/22 0059 05/25/22 1140  WBC  --    < > 6.0 6.8  --  9.5 9.2 8.6  --   --   --  7.2  CREATININE 3.60*  --  2.14*  --  2.61*  --   --  3.50*  --   --  2.46*  --   LATICACIDVEN  --   --   --   --   --   --   --   --  1.0 1.1  --   --    < > = values in this interval not displayed.    Estimated Creatinine Clearance: 22 mL/min (A) (by C-G formula based on SCr of 2.46 mg/dL (H)).    Allergies  Allergen Reactions   Penicillins Rash and Other (See Comments)    Did it involve swelling of the face/tongue/throat, SOB, or low BP? No Did it involve sudden or severe rash/hives, skin peeling, or any reaction on the inside of your mouth or nose? Yes Did you need to seek medical attention at a hospital or doctor's office? Unknown When did it last happen?   60 years    If all above answers are "NO", may proceed with cephalosporin use.   Quinine     UNSPECIFIED REACTION     Arrie Senate, PharmD, BCPS, [facility] Clinical Pharmacist [phone] Please check AMION for all [place] numbers 05/25/2022

## 2022-05-25 NOTE — Progress Notes (Addendum)
NAME:  Terry Paternoster., MRN:  [id number], DOB:  04/11/1935, LOS: 0 ADMISSION DATE:  05/20/2022, CONSULTATION DATE:  8/10 REFERRING MD:  Dr. Billy Fischer, CHIEF COMPLAINT:  GI bleed   History of Present Illness:  Patient is a 87 year old male with pertinent PMH of ESRD (dialysis M, W, F), hypothyroidism, GERD, HFpEF, chronic A-fib on Xarelto (off Xarelto since 7/26), OSA on CPAP, recent GI bleed presents to [facility] ED on 8/10 with GI bleed.  Patient was recently admitted to [facility] on 7/26.  Noted to have colonic diverticulitis and small bowel AVM bleed.  Home Xarelto was held.  Patient given PRBC for low hemoglobin.  Patient was discharged about a week ago and to follow-up with GI outpatient.  Since discharge patient has still had persistent bleeding with BM about a cup of blood per day.  States he really has not had no hematemesis, maybe once.  No heavy EtOH abuse.  Denies fevers, SOB, chest pain.  Does have some elbow pain and LLQ with bowel movements.   Patient presents to [facility] on 8/10 with GI bleed.  Patient mildly hypotensive, otherwise hemodynamically stable.  Afebrile.  On room air.  Initial Hgb 5.9.  Given 2 units PRBCs.  Patient's MAP less than 65 despite PRBCs. K 3.1, creat 2.35 (close to baseline), WBC 4.9.  PCCM consulted for ICU admission.  Pertinent  Medical History   Past Medical History:  Diagnosis Date   Anemia    Arthritis    Atrial fibrillation (HCC)    CHF (congestive heart failure) (HCC)    Chronic kidney disease-mineral and bone disorder    ESRD on hemodialysis ([place])    takes HD in [place] on MWF schedule   Headache    HOH (hard of hearing)    Hyperlipidemia    Hypertension    Hypothyroidism    Peripheral vascular disease (HCC)    Proteinuria    Sleep apnea    wears CPAP 12   Type 2 diabetes mellitus with stage 5 chronic kidney disease ([place])    Wears glasses     Significant Hospital Events: Including procedures, antibiotic start and  stop dates in addition to other pertinent events   8/10 Admitted to [facility] ICU and transfused 2 units 8/12 7-10 bloody bowel movements, prep received yesterday for planned colonoscopy today 8/13 R thoracentesis 8/14 low Bps, remained in ICU  Interim History / Subjective:  MAPs between 57-63 overnight. No further episodes of bloody bowel movements.  He continues to feel short of breath and has early satiety.   Objective   Blood pressure (!) 81/61, pulse 83, temperature (!) 97.5 F (36.4 C), temperature source Axillary, resp. rate 18, weight 74.7 kg, SpO2 95 %.        Intake/Output Summary (Last 24 hours) at 05/25/2022 0616 Last data filed at 05/24/2022 2000 Gross per 24 hour  Intake 360 ml  Output 0 ml  Net 360 ml    Filed Weights   05/21/22 2000 05/24/22 0500 05/24/22 1501  Weight: 73 kg 74.9 kg 74.7 kg    Examination: General: Elderly male, in no acute distress HEENT: MM pink/moist Neuro: Aox3; MAE CV: irregularly irregular, rate 80s, no m/r/g PULM:  decreased breath sounds to lungs bases bilaterally, no wheezing or rhonchi, breathing comfortably on 2LNC GI: soft, bsx4 active, no tenderness Extremities: warm/dry, L BKA, R AKA; LUE fistula without erythema and palpable thrill Skin: no rashes or lesions appreciated  Na 133 K 3.3, he received  37meq K Net + 7L  Pleural protein/ serum ratio 0.6 Pleural LDH/ serum LDH 0.39 LDH > 2/3 upper limit of normal Mononuclear cells  Cytology no malignant cells  CT pending  Resolved Hospital Problem list   Acute blood loss anemia  Assessment & Plan:   Hypotension Maps remains between 57/63 since yesterday. Midodrine 10 TID was started 8/13 due to low blood pressors. Lactate, TSH were within normal limits. He is an HD patient and unsure of what his baseline is. AM-cortisol was indeterminate at 16, checking stim test this AM. -increase midodrine from 10 TID to 15mg  TID  Dieulafoy lesion in splenic flexor s/p clipping 8/12 Hx  colonic diverticulitis and SB AVM Thrombocytopenia Elevated INR Colonoscopy completed 8/12 and showed dieulafoy lesion in splenic flexor and 4 clips were placed. He received 1 unit of blood yesterday for a total of 3 units this admission. No further episodes of blood in stool. -restart Xarelto 8/15 -GI signed off -f/u polyp pathology  Right Lung Mass Exudative Pleural Effusion 2.6 cm Mass in right upper lobe and 3 cm in right lingula, 2.3 cm left lung nodule. Right sided Thoracentesis completed with 850 cc drained  8/13 and LDH/ protein consistent with exudative effusion. Cytology with no malignant cells. PET scan was faxed from PCP office in [place], showed hypermetabolic masses in right upper lung and lingula suspicious for malignancy. - will continue conversations with patient and family about lung biopsy  ESRD HD MWF HD 8/14 per nephro. Blood pressure have been low overnight. Continue MWF. - strict I+Os, daily weights -Trend BMP -Replace electrolytes as indicated -Avoid nephrotoxic agents, ensure adequate renal perfusion  HFrEF HTN 7/20 EF at 35-40%. Echo ordered 8/11, pending. Net + 7L. He does not make urine. Appears euvolemic on exam. - holding home antihypertensives -Daily weights; strict I/os  Afib No recent Xarelto use. -Telemetry monitoring -currently rate controlled. BB being held due to hypotension -restart Xarelto 8/15  OSA - CPAP QHS  DM - CBG monitoring - sensitive SSI  Hypothyroidism Continue synthroid  GERD - PPI  PVD hx BL BKA -restart home ropinirole -mobilize in bed as able to avoid pressure sores   Best Practice (right click and "Reselect all SmartList Selections" daily)   Diet/type: Regular consistency (see orders) DVT prophylaxis: DOAC GI prophylaxis: N/A Lines: N/A Foley:  N/A Code Status:  full code Last date of multidisciplinary goals of care discussion [spoke w daughter, Ms. Adams 8/14]  Daleen Bo. Keyanna Sandefer, D.O.  Internal  Medicine Resident, PGY-2 [facility]  Pager: [phone] 6:16 AM, 05/25/2022

## 2022-05-25 NOTE — Progress Notes (Signed)
ANTICOAGULATION CONSULT NOTE - Initial Consult  Pharmacy Consult for heparin Indication: atrial fibrillation  Allergies  Allergen Reactions   Penicillins Rash and Other (See Comments)    Did it involve swelling of the face/tongue/throat, SOB, or low BP? No Did it involve sudden or severe rash/hives, skin peeling, or any reaction on the inside of your mouth or nose? Yes Did you need to seek medical attention at a hospital or doctor's office? Unknown When did it last happen?   60 years    If all above answers are "NO", may proceed with cephalosporin use.   Quinine     UNSPECIFIED REACTION     Patient Measurements: Height: 5\' 9"  (175.3 cm) Weight: 74.7 kg (164 lb 10.9 oz) IBW/kg (Calculated) : 70.7 Heparin Dosing Weight: 74.7 kg   Vital Signs: Temp: 97.9 F (36.6 C) (08/15 1200) Temp Source: Oral (08/15 1200) BP: 89/31 (08/15 1000) Pulse Rate: 82 (08/15 1000)  Labs: Recent Labs    05/23/22 0008 05/23/22 0809 05/23/22 1804 05/24/22 0036 05/25/22 0059 05/25/22 1140  HGB  --    < > 9.0* 8.6*  --  9.2*  HCT  --    < > 26.9* 26.3*  --  27.8*  PLT  --    < > 131* 133*  --  143*  CREATININE 2.61*  --   --  3.50* 2.46*  --    < > = values in this interval not displayed.    Estimated Creatinine Clearance: 22 mL/min (A) (by C-G formula based on SCr of 2.46 mg/dL (H)).   Medical History: Past Medical History:  Diagnosis Date   Anemia    Arthritis    Atrial fibrillation (HCC)    CHF (congestive heart failure) (HCC)    Chronic kidney disease-mineral and bone disorder    ESRD on hemodialysis ([place])    takes HD in [place] on MWF schedule   Headache    HOH (hard of hearing)    Hyperlipidemia    Hypertension    Hypothyroidism    Peripheral vascular disease (HCC)    Proteinuria    Sleep apnea    wears CPAP 12   Type 2 diabetes mellitus with stage 5 chronic kidney disease (HCC)    Wears glasses     Medications:  Infusions:   calcium gluconate     heparin 1,050  Units/hr (05/25/22 1242)    Assessment: Terry Graves is a 87 y/o male with a history of Afib who was found to have a GIB. He was taking Xarelto PTA, last dose 7/26. Pharmacy has been consulted to restart anticoagulation with heparin.   Goal of Therapy:  Heparin level 0.3-0.7 units/ml Monitor platelets by anticoagulation protocol: Yes   Plan:  Start heparin infusion at 1050 units/hr Check anti-Xa level in 8 hours and daily while on heparin Continue to monitor H&H and platelets  Vicenta Dunning, PharmD  PGY1 Pharmacy Resident

## 2022-05-25 NOTE — Progress Notes (Signed)
[place] Progress Note Patient Name: Terry Graves. DOB: [birth year] MRN: [id number]   Date of Service  05/25/2022  HPI/Events of Note  Blood pressure soft (87/47, MAP 60, Heart rate 61), he has a history of atrial fibrillation.  eICU Interventions  Will give a 250 ml 5 % albumin bolus x 1.        Frederik Pear 05/25/2022, 9:33 PM

## 2022-05-25 NOTE — Evaluation (Signed)
Clinical/Bedside Swallow Evaluation Patient Details  Name: Terry Graves. MRN: [id number] Date of Birth: Jan 28, 1935  Today's Date: 05/25/2022 Time: SLP Start Time (ACUTE ONLY): 57 SLP Stop Time (ACUTE ONLY): 1640 SLP Time Calculation (min) (ACUTE ONLY): 15 min  Past Medical History:  Past Medical History:  Diagnosis Date   Anemia    Arthritis    Atrial fibrillation (HCC)    CHF (congestive heart failure) (HCC)    Chronic kidney disease-mineral and bone disorder    ESRD on hemodialysis ([place])    takes HD in [place] on MWF schedule   Headache    HOH (hard of hearing)    Hyperlipidemia    Hypertension    Hypothyroidism    Peripheral vascular disease (HCC)    Proteinuria    Sleep apnea    wears CPAP 12   Type 2 diabetes mellitus with stage 5 chronic kidney disease ([place])    Wears glasses    Past Surgical History:  Past Surgical History:  Procedure Laterality Date   A/V FISTULAGRAM N/A 03/14/2020   Procedure: A/V FISTULAGRAM - left arm;  Surgeon: Elam Dutch, MD;  Location: [facility];  Service: Cardiovascular;  Laterality: N/A;   ABDOMINAL AORTOGRAM W/LOWER EXTREMITY Bilateral 05/23/2020   Procedure: ABDOMINAL AORTOGRAM W/LOWER EXTREMITY;  Surgeon: Elam Dutch, MD;  Location: [facility];  Service: Cardiovascular;  Laterality: Bilateral;  Bilateral    AV FISTULA PLACEMENT Left 01/01/2020   Procedure: LEFT ARM ARTERIOVENOUS (AV) FISTULA CREATION;  Surgeon: Angelia Mould, MD;  Location: [facility];  Service: Vascular;  Laterality: Left;   CATARACT EXTRACTION W/ INTRAOCULAR LENS  IMPLANT, BILATERAL     COLONOSCOPY     COLONOSCOPY N/A 04/09/2020   Procedure: COLONOSCOPY;  Surgeon: [name], MD;  [facility]: [facility];  Service: Endoscopy;  Laterality: N/A;   COLONOSCOPY Left 05/22/2022   Procedure: COLONOSCOPY;  Surgeon: Lavena Bullion, DO;  Location: [facility];  Service: Gastroenterology;  Laterality: Left;    ESOPHAGOGASTRODUODENOSCOPY N/A 04/25/2019   Procedure: ESOPHAGOGASTRODUODENOSCOPY (EGD);  Surgeon: [name], MD;  [facility];  Service: Endoscopy;  Laterality: N/A;   ESOPHAGOGASTRODUODENOSCOPY N/A 04/08/2020   Procedure: ESOPHAGOGASTRODUODENOSCOPY (EGD);  Surgeon: [name], MD;  [facility];  Service: Endoscopy;  Laterality: N/A;   GIVENS CAPSULE STUDY N/A 04/16/2020   Procedure: GIVENS CAPSULE STUDY;  Surgeon: [name], MD;  [facility];  Service: Endoscopy;  Laterality: N/A;  [place]  05/22/2022   Procedure: HEMOSTASIS CLIP PLACEMENT;  Surgeon: Lavena Bullion, DO;  Location: [facility];  Service: Gastroenterology;;   HIP ARTHROSCOPY Left    INSERTION OF DIALYSIS CATHETER Right 01/01/2020   Procedure: INSERTION OF RIGHT INTERNAL JUGULAR DIALYSIS CATHETER;  Surgeon: Angelia Mould, MD;  Location: [facility];  Service: Vascular;  Laterality: Right;   LIGATION OF COMPETING BRANCHES OF ARTERIOVENOUS FISTULA Left 03/28/2020   Procedure: SIDE BRANCH LIGATION TIMES THREE OF LEFT ARM  ARTERIOVENOUS FISTULA;  Surgeon: Marty Heck, MD;  Location: [facility];  Service: Vascular;  Laterality: Left;   POLYPECTOMY  05/22/2022   Procedure: POLYPECTOMY;  Surgeon: Lavena Bullion, DO;  Location: [facility];  Service: Gastroenterology;;   Clide Deutscher  05/22/2022   Procedure: Clide Deutscher;  Surgeon: Lavena Bullion, DO;  Location: [facility];  Service: Gastroenterology;;   HPI:  Patient is an 87 y.o. male with PMH: ESRD on HD, h/o lung masses,  GERD, OSA on CPAP, ,recent GI bleed who presented to [facility] ED on 8/10 with GI bleed. He was mildly hypotensive but otherwise hemodynamically stable, afebrile on RA, initial Hgb 5.9 and given 2 units PRBCs. RN who had patient this morning reported he had been having some coughing while eating meals.    Assessment / Plan / Recommendation  Clinical Impression  Patient is  presenting with clinical s/s of what SLP suspects to be a primary esophageal versus pharyngoesophageal. He did not exhibit any overt s/s of aspiration or penetration with sips of liquids or bites of purees but he c/o some difficulty swallowing puree. He also told SLP that he tried to swallowing some chicken and fish today and it was very difficult. He reported that his dysphagia has been going on since Thursday and "if I eat too much it gets stuck" (gesturing to indicate his esophagus) and also " it feels like it cuts my wind off". SLP is recommending consider Esophagram to fully assess his esophageal swallow function. SLP will continue to follow patient. SLP Visit Diagnosis: Dysphagia, unspecified (R13.10)    Aspiration Risk  Mild aspiration risk    Diet Recommendation Regular;Thin liquid;Dysphagia 3 (Mech soft)   Liquid Administration via: Cup;Straw Medication Administration: Whole meds with liquid Supervision: Patient able to self feed Compensations: Slow rate;Small sips/bites Postural Changes: Seated upright at 90 degrees;Remain upright for at least 30 minutes after po intake    Other  Recommendations Oral Care Recommendations: Oral care BID    Recommendations for follow up therapy are one component of a multi-disciplinary discharge planning process, led by the attending physician.  Recommendations may be updated based on patient status, additional functional criteria and insurance authorization.  Follow up Recommendations Other (comment) (TBD)      Assistance Recommended at Discharge Intermittent Supervision/Assistance  Functional Status Assessment Patient has had a recent decline in their functional status and demonstrates the ability to make significant improvements in function in a reasonable and predictable amount of time.  Frequency and Duration min 2x/week  1 week       Prognosis Prognosis for Safe Diet Advancement: Good Barriers to Reach Goals: Time post onset       Swallow Study   General Date of Onset: 05/25/22 HPI: Patient is an 87 y.o. male with PMH: ESRD on HD, h/o lung masses, GERD, OSA on CPAP, ,recent GI bleed who presented to [facility] ED on 8/10 with GI bleed. He was mildly hypotensive but otherwise hemodynamically stable, afebrile on RA, initial Hgb 5.9 and given 2 units PRBCs. RN who had patient this morning reported he had been having some coughing while eating meals. Type of Study: Bedside Swallow Evaluation Previous Swallow Assessment: none found Diet Prior to this Study: Regular;Thin liquids Temperature Spikes Noted: No Respiratory Status: Nasal cannula History of Recent Intubation: No Behavior/Cognition: Alert;Cooperative;Pleasant mood Oral Cavity Assessment: Within Functional Limits Oral Care Completed by SLP: No Oral Cavity - Dentition: Adequate natural dentition;Missing dentition Vision: Functional for self-feeding Self-Feeding Abilities: Able to feed self Patient Positioning: Upright in bed Baseline Vocal Quality: Low vocal intensity;Normal Volitional Cough: Strong Volitional Swallow: Able to elicit    Oral/Motor/Sensory Function Overall Oral Motor/Sensory Function: Within functional limits   Ice Chips     Thin Liquid Thin Liquid: Within functional limits Presentation: Straw;Self Fed    Nectar Thick     Honey Thick     Puree Puree: Impaired Presentation: Spoon Pharyngeal Phase Impairments: Other (comments) Other Comments: patient reporting some difficulty swallowing it  down   Solid     Solid: Not tested      Sonia Baller, MA, CCC-SLP Speech Therapy

## 2022-05-25 NOTE — Progress Notes (Signed)
[place] Progress Note Patient Name: Terry Graves. DOB: Feb 08, 1935 MRN: [id number]   Date of Service  05/25/2022  HPI/Events of Note  K+ 3.3, patient has ESRD.  eICU Interventions  KCL 20 meq po x 1 .        Kerry Kass Gar Glance 05/25/2022, 4:44 AM

## 2022-05-25 NOTE — Progress Notes (Signed)
[place] for heparin Indication: atrial fibrillation  Allergies  Allergen Reactions   Penicillins Rash and Other (See Comments)    Did it involve swelling of the face/tongue/throat, SOB, or low BP? No Did it involve sudden or severe rash/hives, skin peeling, or any reaction on the inside of your mouth or nose? Yes Did you need to seek medical attention at a hospital or doctor's office? Unknown When did it last happen?   60 years    If all above answers are "NO", may proceed with cephalosporin use.   Quinine     UNSPECIFIED REACTION     Patient Measurements: Height: 5\' 9"  (175.3 cm) Weight: 74.7 kg (164 lb 10.9 oz) IBW/kg (Calculated) : 70.7 Heparin Dosing Weight: 74.7 kg   Vital Signs: Temp: 97.6 F (36.4 C) (08/15 1924) Temp Source: Oral (08/15 1924) BP: 78/49 (08/15 2100) Pulse Rate: 65 (08/15 2119)  Labs: Recent Labs    05/23/22 0008 05/23/22 0809 05/23/22 1804 05/24/22 0036 05/25/22 0059 05/25/22 1140 05/25/22 2129  HGB  --    < > 9.0* 8.6*  --  9.2*  --   HCT  --    < > 26.9* 26.3*  --  27.8*  --   PLT  --    < > 131* 133*  --  143*  --   HEPARINUNFRC  --   --   --   --   --   --  0.15*  CREATININE 2.61*  --   --  3.50* 2.46*  --   --    < > = values in this interval not displayed.     Estimated Creatinine Clearance: 22 mL/min (A) (by C-G formula based on SCr of 2.46 mg/dL (H)).   Medical History: Past Medical History:  Diagnosis Date   Anemia    Arthritis    Atrial fibrillation (HCC)    CHF (congestive heart failure) (HCC)    Chronic kidney disease-mineral and bone disorder    ESRD on hemodialysis ([place])    takes HD in [place] on MWF schedule   Headache    HOH (hard of hearing)    Hyperlipidemia    Hypertension    Hypothyroidism    Peripheral vascular disease (HCC)    Proteinuria    Sleep apnea    wears CPAP 12   Type 2 diabetes mellitus with stage 5 chronic kidney disease (HCC)    Wears glasses      Medications:  Infusions:   albumin human     calcium gluconate     ceFEPime (MAXIPIME) IV 2 g (05/25/22 2214)   [START ON 05/26/2022] ceFEPime (MAXIPIME) IV     heparin 1,050 Units/hr (05/25/22 1800)   metronidazole 500 mg (05/25/22 1837)   [START ON 05/26/2022] vancomycin      Assessment: GA is a 87 y/o male with a history of Afib who was found to have a GIB. He was taking Xarelto PTA, last dose 7/26. Pharmacy has been consulted to restart anticoagulation with heparin.   Initial heparin level subtherapeutic at 0.15, no S/Sx bleeding or infusion issues noted by RN.  Goal of Therapy:  Heparin level 0.3-0.7 units/ml Monitor platelets by anticoagulation protocol: Yes   Plan:  Increase heparin to 1250 units/h Recheck heparin level in 8h  Arrie Senate, PharmD, [place], [facility] Clinical Pharmacist [phone] Please check AMION for all [facility] Pharmacy numbers 05/25/2022

## 2022-05-25 NOTE — Progress Notes (Signed)
[facility] Progress Note  Subjective: patient seen and examined bedside in room. Midodrine uptitrated in the context of soft BPs. Tolerated HD yesterday, no UF. No other complaints currently. Discussed with daughter over patient's cell phone (was not on midodrine as an outpatient).  Vitals:   05/25/22 0545 05/25/22 0600 05/25/22 0615 05/25/22 0630  BP: (!) 83/53 (!) 70/61 (!) 82/57 (!) 85/70  Pulse: 90 87 92 94  Resp: 20 (!) 24 (!) 22 (!) 22  Temp:      TempSrc:      SpO2: 95% 93% 97% 97%  Weight:       Exam: Gen: NAD, laying flat in bed CV: RRR Resp: CTA BL Abd: soft, nt/nd Ext: no sig thigh edema b/l, rt aka, lt bka Neuro: awake, alert Dialysis access: LUE AVF +b/t   CT chest 8/10 - IMPRESSION: 1. Large right pleural effusion with associated compressive atelectasis of the dependent right lung. 2. Moderate complex left pleural effusion with associated pleural thickening and complex internal architecture which may relate to hemorrhagic or proteinaceous fluid. Super infection, as can be seen with empyema, is not excluded and correlation with clinical laboratory examination is recommended. Diagnostic thoracentesis may be helpful for further evaluation. Associated subtotal collapse of the left lower lobe. 3. Superimposed 2.6 cm mass within the right upper lobe and 3.0 cm mass within the aerated lingula.    Recent Labs  Lab 05/20/22 1313 05/20/22 2105 05/22/22 1423 05/22/22 1737 05/23/22 1804 05/24/22 0036 05/25/22 0059  HGB 5.9*   < > 8.7*   < > 9.0* 8.6*  --   ALBUMIN 2.3*  --   --   --   --   --   --   CALCIUM 7.8*   < > 7.4*   < >  --  7.3* 7.6*  PHOS  --   --  2.8  --   --   --   --   CREATININE 2.35*   < > 2.14*   < >  --  3.50* 2.46*  K 3.1*   < > 3.2*   < >  --  3.4* 3.3*   < > = values in this interval not displayed.   Recent Labs  Lab 05/20/22 1313  IRON 78  TIBC 147*  FERRITIN 1,069*   Inpatient medications:  atorvastatin  40 mg Oral QHS    Chlorhexidine Gluconate Cloth  6 each Topical Daily   Chlorhexidine Gluconate Cloth  6 each Topical Q0600   cosyntropin  0.25 mg Intravenous Once   cyanocobalamin  1,000 mcg Intramuscular Q7 days   levothyroxine  25 mcg Oral QAC breakfast   midodrine  15 mg Oral TID WC   pantoprazole  40 mg Oral Daily   rOPINIRole  0.5 mg Oral QHS   sodium chloride flush  3 mL Intravenous Q12H    calcium gluconate     magnesium sulfate bolus IVPB     benzonatate, docusate sodium, ipratropium-albuterol, mouth rinse, polyethylene glycol    OP HD: MWF [place] (> per Care Everywhere)  3h 47min  400/2.0  73kg  2/2.5 bath  LUA AVG   16ga  Hep ? 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 SP 3u prbcs total. Hb stable. GI signed off Shock - due to hemorrhage as above, not on pressors at  this time Pleural effusions/ lung mass - by CT. Is being f/b doctor in [place] for lung mass. Per CCM. S/p thora 8/13-850cc drained ESRD - on HD MWF in [place]. Will c/w MWF schedule Anemia esrd - Hb 5.9 on admission, hgb 8.6 on 8/15. Fe replete on panel. Will start aranesp for 8/16 MBD ckd - CCa in range, phos is low at 2.8.  No binders needed. Cont to monitor Atrial fib - holding blood thinners d/t #1, resumption per primary HTN/volume - holding home coreg. Volume status acceptable today, agree with increasing midodrine Urinary urgency- UA pending

## 2022-05-25 NOTE — Progress Notes (Signed)
RT placed patient on CPAP HS. 2L O2 Bleed in needed. Patient tolerating well at this time.

## 2022-05-25 NOTE — Progress Notes (Signed)
CT chest reviewed> significantly abnormal left hemithorax with new loculated effusion most noticeable in upper lung zones-- new since 8/10. Given concern for aspiration clinically and on CT, will start empiric antibiotics for aspiration pneumonia. Need to discuss potential need for a chest tube and intrapleural lytics with he and his family tomorrow.  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

## 2022-05-25 NOTE — Progress Notes (Signed)
Patients daughter updated by phone.

## 2022-05-25 NOTE — Progress Notes (Signed)
Heparin stopped due to no access : will resume once access available

## 2022-05-25 NOTE — Progress Notes (Signed)
[facility] Progress Note Patient Name: Terry Graves. DOB: Feb 11, 1935 MRN: [id number]   Date of Service  05/25/2022  HPI/Events of Note  Hypotension with unavailable iv access to administer fluids and pressors, iv readings are also unreliable.  eICU Interventions  PICC line and arterial line ordered.        Kerry Kass Kaled Allende 05/25/2022, 11:16 PM

## 2022-05-26 ENCOUNTER — Other Ambulatory Visit (HOSPITAL_COMMUNITY): Payer: Self-pay

## 2022-05-26 ENCOUNTER — Telehealth (HOSPITAL_COMMUNITY): Payer: Self-pay | Admitting: Pharmacy Technician

## 2022-05-26 ENCOUNTER — Inpatient Hospital Stay (HOSPITAL_COMMUNITY): Payer: Medicare Other

## 2022-05-26 DIAGNOSIS — J9601 Acute respiratory failure with hypoxia: Secondary | ICD-10-CM | POA: Diagnosis not present

## 2022-05-26 DIAGNOSIS — D649 Anemia, unspecified: Secondary | ICD-10-CM | POA: Diagnosis not present

## 2022-05-26 DIAGNOSIS — J90 Pleural effusion, not elsewhere classified: Secondary | ICD-10-CM | POA: Diagnosis not present

## 2022-05-26 DIAGNOSIS — K922 Gastrointestinal hemorrhage, unspecified: Secondary | ICD-10-CM | POA: Diagnosis not present

## 2022-05-26 DIAGNOSIS — A419 Sepsis, unspecified organism: Secondary | ICD-10-CM

## 2022-05-26 DIAGNOSIS — R6521 Severe sepsis with septic shock: Secondary | ICD-10-CM

## 2022-05-26 DIAGNOSIS — J690 Pneumonitis due to inhalation of food and vomit: Secondary | ICD-10-CM | POA: Diagnosis not present

## 2022-05-26 DIAGNOSIS — R531 Weakness: Secondary | ICD-10-CM

## 2022-05-26 DIAGNOSIS — Z66 Do not resuscitate: Secondary | ICD-10-CM

## 2022-05-26 DIAGNOSIS — Z515 Encounter for palliative care: Secondary | ICD-10-CM

## 2022-05-26 DIAGNOSIS — N186 End stage renal disease: Secondary | ICD-10-CM | POA: Diagnosis not present

## 2022-05-26 DIAGNOSIS — Z992 Dependence on renal dialysis: Secondary | ICD-10-CM

## 2022-05-26 LAB — CBC
HCT: 29.9 % — ABNORMAL LOW (ref 39.0–52.0)
Hemoglobin: 9.5 g/dL — ABNORMAL LOW (ref 13.0–17.0)
MCH: 30.6 pg (ref 26.0–34.0)
MCHC: 31.8 g/dL (ref 30.0–36.0)
MCV: 96.5 fL (ref 80.0–100.0)
Platelets: 183 10*3/uL (ref 150–400)
RBC: 3.1 MIL/uL — ABNORMAL LOW (ref 4.22–5.81)
RDW: 17.2 % — ABNORMAL HIGH (ref 11.5–15.5)
WBC: 8 10*3/uL (ref 4.0–10.5)
nRBC: 0 % (ref 0.0–0.2)

## 2022-05-26 LAB — BASIC METABOLIC PANEL
Anion gap: 12 (ref 5–15)
BUN: 18 mg/dL (ref 8–23)
CO2: 24 mmol/L (ref 22–32)
Calcium: 7.9 mg/dL — ABNORMAL LOW (ref 8.9–10.3)
Chloride: 95 mmol/L — ABNORMAL LOW (ref 98–111)
Creatinine, Ser: 3.47 mg/dL — ABNORMAL HIGH (ref 0.61–1.24)
GFR, Estimated: 17 mL/min — ABNORMAL LOW (ref >60)
Glucose, Bld: 216 mg/dL — ABNORMAL HIGH (ref 70–99)
Potassium: 3.9 mmol/L (ref 3.5–5.1)
Sodium: 131 mmol/L — ABNORMAL LOW (ref 135–145)

## 2022-05-26 LAB — GLUCOSE, CAPILLARY
Glucose-Capillary: 106 mg/dL — ABNORMAL HIGH (ref 70–99)
Glucose-Capillary: 110 mg/dL — ABNORMAL HIGH (ref 70–99)
Glucose-Capillary: 170 mg/dL — ABNORMAL HIGH (ref 70–99)
Glucose-Capillary: 207 mg/dL — ABNORMAL HIGH (ref 70–99)
Glucose-Capillary: 225 mg/dL — ABNORMAL HIGH (ref 70–99)
Glucose-Capillary: 93 mg/dL (ref 70–99)

## 2022-05-26 LAB — HEPARIN LEVEL (UNFRACTIONATED)
Heparin Unfractionated: 0.3 IU/mL (ref 0.30–0.70)
Heparin Unfractionated: <0.1 IU/mL — ABNORMAL LOW (ref 0.30–0.70)
Heparin Unfractionated: 0.57 IU/mL (ref 0.30–0.70)

## 2022-05-26 LAB — MRSA NEXT GEN BY PCR, NASAL: MRSA by PCR Next Gen: NOT DETECTED

## 2022-05-26 MED ORDER — PANTOPRAZOLE SODIUM 40 MG PO TBEC: 40.0000 mg | DELAYED_RELEASE_TABLET | Freq: Every day | ORAL | Status: DC

## 2022-05-26 MED ORDER — INSULIN ASPART 100 UNIT/ML IJ SOLN
1.0000 [IU] | INTRAMUSCULAR | Status: DC
  Administered 2022-05-26: 2 [IU] via SUBCUTANEOUS
  Administered 2022-05-26: 3 [IU] via SUBCUTANEOUS
  Administered 2022-05-27: 1 [IU] via SUBCUTANEOUS
  Administered 2022-05-27 (×2): 2 [IU] via SUBCUTANEOUS
  Administered 2022-05-30 (×2): 3 [IU] via SUBCUTANEOUS
  Administered 2022-05-30: 2 [IU] via SUBCUTANEOUS
  Administered 2022-05-31: 1 [IU] via SUBCUTANEOUS
  Administered 2022-05-31 (×2): 2 [IU] via SUBCUTANEOUS
  Administered 2022-05-31 (×2): 1 [IU] via SUBCUTANEOUS

## 2022-05-26 MED ORDER — SODIUM CHLORIDE 0.9% FLUSH
10.0000 mL | Freq: Two times a day (BID) | INTRAVENOUS | Status: DC
  Administered 2022-05-27 – 2022-05-31 (×5): 10 mL

## 2022-05-26 MED ORDER — SODIUM CHLORIDE 0.9% FLUSH
10.0000 mL | INTRAVENOUS | Status: DC | PRN
  Administered 2022-05-26: 20 mL

## 2022-05-26 MED ORDER — MIDODRINE HCL 5 MG PO TABS
20.0000 mg | ORAL_TABLET | Freq: Three times a day (TID) | ORAL | Status: DC
  Administered 2022-05-26 – 2022-06-01 (×20): 20 mg via ORAL
  Filled 2022-05-26 (×21): qty 4

## 2022-05-26 MED ORDER — NOREPINEPHRINE 16 MG/250ML-% IV SOLN
0.0000 ug/min | INTRAVENOUS | Status: DC
  Administered 2022-05-26: 3 ug/min via INTRAVENOUS

## 2022-05-26 NOTE — Progress Notes (Signed)
Received patient in bed to unit.  Alert and oriented.  Informed consent signed and in  chart.   Treatment initiated: 1423 Treatment completed: 1812  Patient tolerated well.  Transported back to the room  alert, without acute distress.  Hand-off given to patient's nurse.   Access used: fistula Access issues: None  Total UF removed: 1900 Medication(s) given: Aranesp 60 mcg Post HD VS: See chart Post HD weight: 73.2 kg   [facility]

## 2022-05-26 NOTE — Progress Notes (Signed)
NAME:  Terry Graves., MRN:  [id number], DOB:  April 18, 1935, LOS: 0 ADMISSION DATE:  05/20/2022, CONSULTATION DATE:  8/10 REFERRING MD:  Dr. Billy Fischer, CHIEF COMPLAINT:  GI bleed   History of Present Illness:  Patient is a 87 year old male with pertinent PMH of ESRD (dialysis M, W, F), hypothyroidism, GERD, HFpEF, chronic A-fib on Xarelto (off Xarelto since 7/26), OSA on CPAP, recent GI bleed presents to [facility] ED on 8/10 with GI bleed.  Patient was recently admitted to [facility] on 7/26.  Noted to have colonic diverticulitis and small bowel AVM bleed.  Home Xarelto was held.  Patient given PRBC for low hemoglobin.  Patient was discharged about a week ago and to follow-up with GI outpatient.  Since discharge patient has still had persistent bleeding with BM about a cup of blood per day.  States he really has not had no hematemesis, maybe once.  No heavy EtOH abuse.  Denies fevers, SOB, chest pain.  Does have some elbow pain and LLQ with bowel movements.   Patient presents to [facility] on 8/10 with GI bleed.  Patient mildly hypotensive, otherwise hemodynamically stable.  Afebrile.  On room air.  Initial Hgb 5.9.  Given 2 units PRBCs.  Patient's MAP less than 65 despite PRBCs. K 3.1, creat 2.35 (close to baseline), WBC 4.9.  PCCM consulted for ICU admission.  Pertinent  Medical History   Past Medical History:  Diagnosis Date   Anemia    Arthritis    Atrial fibrillation (HCC)    CHF (congestive heart failure) (HCC)    Chronic kidney disease-mineral and bone disorder    ESRD on hemodialysis ([place])    takes HD in [place] on MWF schedule   Headache    HOH (hard of hearing)    Hyperlipidemia    Hypertension    Hypothyroidism    Peripheral vascular disease (HCC)    Proteinuria    Sleep apnea    wears CPAP 12   Type 2 diabetes mellitus with stage 5 chronic kidney disease ([place])    Wears glasses     Significant Hospital Events: Including procedures, antibiotic start and  stop dates in addition to other pertinent events   8/10 Admitted to [facility] ICU and transfused 2 units 8/12 7-10 bloody bowel movements, prep received yesterday for planned colonoscopy today 8/13 R thoracentesis 8/14 low Bps, remained in ICU 8/15 central placed  Interim History / Subjective:  MAPs remain low. Central line placed. On levophed overnight.  Levophed off this AM. He feels short of breath and unwell.   Objective   Blood pressure 133/76, pulse 88, temperature (!) 97.5 F (36.4 C), temperature source Axillary, resp. rate 20, height 5\' 9"  (1.753 m), weight 75.6 kg, SpO2 98 %.        Intake/Output Summary (Last 24 hours) at 05/26/2022 0654 Last data filed at 05/26/2022 0600 Gross per 24 hour  Intake 1600.92 ml  Output 26 ml  Net 1574.92 ml    Filed Weights   05/24/22 1501 05/25/22 1200 05/26/22 0403  Weight: 74.7 kg 74.7 kg 75.6 kg    Examination: General: Elderly male, in no acute distress HEENT: MM pink/moist Neuro: Aox3; MAE CV: irregularly irregular, rate 80-100s, no m/r/g PULM:  decreased breath sounds to lungs bases bilaterally, no wheezing or rhonchi, breathing comfortably on 2LNC GI: soft, bsx4 active, no tenderness Extremities: warm/dry, L BKA, R AKA; LUE fistula without erythema and palpable thrill Skin: no rashes or lesions appreciated  Na  131 K 3.9 Hgb 9.5 WBC 8 Glucose 232 overnight  Net + 9L  Pleural protein/ serum ratio 0.6 Pleural LDH/ serum LDH 0.39 LDH > 2/3 upper limit of normal Mononuclear cells  Cytology no malignant cells  CT chest showed no acute PE> New occlusive debris present in left bronchus. Moderate left pleural effusion with empyema versus chronic pleural effusion. Large right pleural effusion, moderate left pleural effusion. Mass in upper right lobe  Resolved Hospital Problem list   Acute blood loss anemia  Assessment & Plan:   Hypotension Blood pressure remains lower. Unsure of baseline. Lactate, TSH and adrenal  sufficiency work-up negative. Systolic pressure dipped into 70s and he was started on levophed. This was stopped this AM. Midodrine dosing increased 8/15 from 10mg  TID to 15 mg TID without much effect. -con't ICU monitoring -goal SBP >90 -increase midodrine from 15mg  to 20mg  q8 hrs  Aspiration pneumonia CT chest PE showed new occlusive debris present in left bronchus. He was evalauted by speech therapy 8/15 with recommendations for dysphagia 3 diet and esophagram. No leukocytosis and he has remained afebrile. Started on cefepime/ vance 8/15. -esophogram -continue cefepime. vanc -MRSA nasal swab  ?Empyema vs chronic pleural effusion L Large right pleural effusion  CT chest showed bilateral pleural effusions. The left has findings concerning for empyema. -consider chest tube with lytics after discussion with patient and family  Lungs masses PET scan with multifocal hypermetabolic masses Patient had pathologic fracture in 4/23. He has not had definitive work-up for diagnosis of cancer. PET scan showed multiple hypermetabolic masses in his lungs. I talked with daughter and she stated that she has to convince her dad to go to dialysis and that he feels miserable afterwards. We talked about likely that biopsy would reflect cancer and he would be a poor candidate for chemotherapy. 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 He received 1 unit of blood yesterday for a total of 3 units this admission. No further episodes of blood in stool. Heparin started 8/15, no bleeding overnight. Pathology of polyp negative for high-grade dysplasia. -continue heparin until discussion about chest tube -GI signed off  ESRD HD MWF HD 8/14 per nephro. Blood pressure have been low overnight. Continue MWF. - strict  I+Os, daily weights -Trend BMP -Replace electrolytes as indicated -Avoid nephrotoxic agents, ensure adequate renal perfusion  HFrEF HTN 7/20 EF at 35-40%. Echo ordered 8/11, pending. Net + 9L. He does not make urine. Appears euvolemic on exam. - holding home antihypertensives -Daily weights; strict I/os  Afib No recent Xarelto use. -Telemetry monitoring -currently rate controlled. BB being held due to hypotension -on heparin until decision about procedures  OSA - CPAP QHS  DM - CBG monitoring - sensitive SSI  Hypothyroidism Continue synthroid  GERD - PPI  PVD hx BL BKA -restart home ropinirole -mobilize in bed as able to avoid pressure sores   Best Practice (right click and "Reselect all SmartList Selections" daily)   Diet/type: dysphagia diet (see orders) DVT prophylaxis: prophylactic heparin  GI prophylaxis: N/A Lines: N/A Foley:  N/A Code Status:  full code Last date of multidisciplinary goals of care discussion [spoke w daughter, Ms. Adams 8/15]  Daleen Bo. Chris Narasimhan, D.O.  Internal Medicine Resident, PGY-2 [facility]  Pager: [phone] 6:54 AM, 05/26/2022

## 2022-05-26 NOTE — Progress Notes (Addendum)
[facility] Progress Note  Subjective: patient seen and examined bedside in room. Now w/ [facility] TLC. Running on low dose levo given low BP yesterday. On abx for possible asp pna. Does endorse SOB  Vitals:   05/26/22 0715 05/26/22 0730 05/26/22 0745 05/26/22 0800  BP: 134/62 134/77 (!) 145/73 130/79  Pulse: 70 92 94 83  Resp: 19 (!) 26 (!) 23 (!) 26  Temp:      TempSrc:      SpO2: 100% 100% 100% 100%  Weight:      Height:       Exam: Gen: NAD, sitting up in bed CV: RRR Resp: decreased air entry bibasilar Abd: soft, nt/nd Ext: no sig thigh edema b/l, rt aka, lt bka Neuro: awake, alert Dialysis access: LUE AVF +b/t   CT chest 8/10 - IMPRESSION: 1. Large right pleural effusion with associated compressive atelectasis of the dependent right lung. 2. Moderate complex left pleural effusion with associated pleural thickening and complex internal architecture which may relate to hemorrhagic or proteinaceous fluid. Super infection, as can be seen with empyema, is not excluded and correlation with clinical laboratory examination is recommended. Diagnostic thoracentesis may be helpful for further evaluation. Associated subtotal collapse of the left lower lobe. 3. Superimposed 2.6 cm mass within the right upper lobe and 3.0 cm mass within the aerated lingula.    Recent Labs  Lab 05/20/22 1313 05/20/22 2105 05/22/22 1423 05/22/22 1737 05/25/22 0059 05/25/22 1140 05/26/22 0120  HGB 5.9*   < > 8.7*   < >  --  9.2* 9.5*  ALBUMIN 2.3*  --   --   --   --   --   --   CALCIUM 7.8*   < > 7.4*   < > 7.6*  --  7.9*  PHOS  --   --  2.8  --   --   --   --   CREATININE 2.35*   < > 2.14*   < > 2.46*  --  3.47*  K 3.1*   < > 3.2*   < > 3.3*  --  3.9   < > = values in this interval not displayed.   Recent Labs  Lab 05/20/22 1313  IRON 78  TIBC 147*  FERRITIN 1,069*   Inpatient medications:  atorvastatin  40 mg Oral QHS   Chlorhexidine Gluconate Cloth  6 each Topical Daily    Chlorhexidine Gluconate Cloth  6 each Topical Q0600   cyanocobalamin  1,000 mcg Intramuscular Q7 days   darbepoetin (ARANESP) injection - DIALYSIS  60 mcg Intravenous Q Wed-HD   insulin aspart  1-3 Units Subcutaneous Q4H   levothyroxine  25 mcg Oral QAC breakfast   midodrine  20 mg Oral Q8H   pantoprazole  40 mg Oral Daily   rOPINIRole  0.5 mg Oral QHS   sodium chloride flush  3 mL Intravenous Q12H    calcium gluconate     ceFEPime (MAXIPIME) IV     heparin 1,250 Units/hr (05/26/22 0029)   metronidazole 500 mg (05/26/22 0615)   norepinephrine (LEVOPHED) Adult infusion 2 mcg/min (05/26/22 0545)   vancomycin     benzonatate, docusate sodium, ipratropium-albuterol, mouth rinse, polyethylene glycol    OP HD: MWF [facility] (> per Care Everywhere)  3h 65min  400/2.0  73kg  2/2.5 bath  LUA AVG   16ga  Hep ? 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 SP 3u prbcs total. Hb stable Shock - due to hemorrhage as above, not on pressors at this time Aspiration Pna: on vanc/cefepime. Possible chest tube? Per PCCM Pleural effusions/ lung mass - by CT. Is being f/b doctor in [place] for lung mass. Per CCM. S/p thora 8/13-850cc drained. Pressor support per primary ESRD - on HD MWF in [place]. Will c/w MWF schedule. Although on low dose levo, his MAPs are in the 90's. Will attempt UF 1-2L as tolerated w/ albumin/midodrine support. If BP is not tolerating HD, then would need to consider CRRT Anemia esrd - Hb 5.9 on admission.. Fe replete on panel. Will start aranesp for 8/16. Hgb 9.5 MBD ckd - CCa in range, phos is low at 2.8.  No binders needed. Cont to monitor Atrial fib - holding blood thinners d/t #1, resumption per primary HTN/volume - on pressors as above. UF plan as above

## 2022-05-26 NOTE — TOC Progression Note (Signed)
Transition of Care (TOC) - Progression Note    Patient Details  Name: Terry Graves. MRN: [id number] Date of Birth: 1934-10-24  Transition of Care [facility]) CM/SW Contact  Angelita Ingles, RN Phone Number:[phone]  05/26/2022, 4:17 PM  Clinical Narrative:    TOC continues to follow. No disposition needs identified at this point.         Expected Discharge Plan and Services                                                 Social Determinants of Health (SDOH) Interventions    Readmission Risk Interventions     No data to display

## 2022-05-26 NOTE — TOC Benefit Eligibility Note (Signed)
Patient Teacher, English as a foreign language completed.    The patient is currently admitted and upon discharge could be taking Eliquis 5 mg.  The current 30 day co-pay is $89.84.   The patient is insured through [place] Patient Advocate Specialist [place] Patient Advocate Team Direct Number: [phone]  Fax: [phone]

## 2022-05-26 NOTE — Procedures (Signed)
Central Venous Catheter Insertion Procedure Note  Harinder Romas  [id number]  03-Feb-1935  Date:05/26/22  Time:12:18 AM   Provider Performing:Carletta Feasel Shearon Stalls   Procedure: Insertion of Non-tunneled Central Venous [phone]) with US guidance ([id number])   Indication(s) Medication administration and Difficult access  Consent Risks of the procedure as well as the alternatives and risks of each were explained to the patient and/or caregiver.  Consent for the procedure was obtained and is signed in the bedside chart  Anesthesia Topical only with 1% lidocaine   Timeout Verified patient identification, verified procedure, site/side was marked, verified correct patient position, special equipment/implants available, medications/allergies/relevant history reviewed, required imaging and test results available.  Sterile Technique Maximal sterile technique including full sterile barrier drape, hand hygiene, sterile gown, sterile gloves, mask, hair covering, sterile ultrasound probe cover (if used).  Procedure Description Area of catheter insertion was cleaned with chlorhexidine and draped in sterile fashion.  With real-time ultrasound guidance a central venous catheter was placed into the left internal jugular vein. Nonpulsatile blood flow and easy flushing noted in all ports.  The catheter was sutured in place and sterile dressing applied.  Complications/Tolerance None; patient tolerated the procedure well. Chest X-ray is ordered to verify placement for internal jugular or subclavian cannulation.   Chest x-ray is not ordered for femoral cannulation.  EBL Minimal  Specimen(s) None  Attempted L [place] insertion at first; however, pt too uncomfortable. Aborted after first stick and pt moving too much and moved to L IJ approach.   Montey Hora, [facility] For pager details, please see AMION or use Epic chat  After 1900, please call [facility] for cross  coverage needs 05/26/2022, 12:19 AM

## 2022-05-26 NOTE — Progress Notes (Signed)
[facility] Progress Note Patient Name: Terry Graves. DOB: [birth year] MRN: [id number]   Date of Service  05/26/2022  HPI/Events of Note  Patient with hyperglycemia.  eICU Interventions  CBG with SSI coverage ordered.        Kerry Kass Allaina Brotzman 05/26/2022, 1:34 AM

## 2022-05-26 NOTE — Progress Notes (Signed)
[place] for heparin Indication: atrial fibrillation  Allergies  Allergen Reactions   Penicillins Rash and Other (See Comments)    Did it involve swelling of the face/tongue/throat, SOB, or low BP? No Did it involve sudden or severe rash/hives, skin peeling, or any reaction on the inside of your mouth or nose? Yes Did you need to seek medical attention at a hospital or doctor's office? Unknown When did it last happen?   60 years    If all above answers are "NO", may proceed with cephalosporin use.   Quinine     UNSPECIFIED REACTION     Patient Measurements: Height: 5\' 9"  (175.3 cm) Weight: 75.6 kg (166 lb 10.7 oz) IBW/kg (Calculated) : 70.7 Heparin Dosing Weight: 74.7 kg   Vital Signs: Temp: 96.5 F (35.8 C) (08/16 1332) Temp Source: Axillary (08/16 1332) BP: 121/60 (08/16 1530) Pulse Rate: 79 (08/16 1530)  Labs: Recent Labs    05/24/22 0036 05/25/22 0059 05/25/22 1140 05/25/22 2129 05/26/22 0120 05/26/22 0543 05/26/22 1437  HGB 8.6*  --  9.2*  --  9.5*  --   --   HCT 26.3*  --  27.8*  --  29.9*  --   --   PLT 133*  --  143*  --  183  --   --   HEPARINUNFRC  --   --   --  0.15*  --  0.30 <0.10*  CREATININE 3.50* 2.46*  --   --  3.47*  --   --      Estimated Creatinine Clearance: 15.6 mL/min (A) (by C-G formula based on SCr of 3.47 mg/dL (H)).   Medical History: Past Medical History:  Diagnosis Date   Anemia    Arthritis    Atrial fibrillation (HCC)    CHF (congestive heart failure) (HCC)    Chronic kidney disease-mineral and bone disorder    ESRD on hemodialysis ([place])    takes HD in [place] on MWF schedule   Headache    HOH (hard of hearing)    Hyperlipidemia    Hypertension    Hypothyroidism    Peripheral vascular disease (HCC)    Proteinuria    Sleep apnea    wears CPAP 12   Type 2 diabetes mellitus with stage 5 chronic kidney disease ([place])    Wears glasses     Medications:  Infusions:   ceFEPime  (MAXIPIME) IV     heparin 1,250 Units/hr (05/26/22 1200)   metronidazole Stopped (05/26/22 0715)   norepinephrine (LEVOPHED) Adult infusion Stopped (05/26/22 1121)    Assessment: Terry Graves is a 87 y/o male with a history of Afib who was found to have a GIB. He was taking Xarelto PTA, last dose 7/26. Pharmacy has been consulted to restart anticoagulation with heparin.   Most recent level undetectable. After speaking with RN, issues with the line noted but no bleeding concerns. Since level was therapeutic on most recent rate, will continue the current infusion rate.  Goal of Therapy:  Heparin level 0.3-0.7 units/ml Monitor platelets by anticoagulation protocol: Yes   Plan:  Continue heparin to 1250 units/h Recheck heparin level in 8h  Vicenta Dunning, PharmD  PGY1 Pharmacy Resident

## 2022-05-26 NOTE — Consult Note (Signed)
Consultation Note Date: 05/26/2022   Patient Name: Terry Graves.  DOB: 12-25-1934  MRN: [id number]  Age / Sex: 87 y.o., male  PCP: [name], MD Referring Physician: Julian Hy, DO  Reason for Consultation: Establishing goals of care and Psychosocial/spiritual support  HPI/Patient Profile: 87 y.o. male   admitted on 05/20/2022 with  Patient is a 87 year old male with pertinent PMH of ESRD (dialysis M, W, F), hypothyroidism, GERD, HFpEF, chronic A-fib on Xarelto (off Xarelto since 7/26), OSA on CPAP, recent GI bleed presents to [facility] ED on 8/10 with GI bleed.  Patient was recently admitted to [facility] on 7/26.  Noted to have colonic diverticulitis and small bowel AVM bleed.  Home Xarelto was held.  Patient given PRBC for low hemoglobin.  Patient was discharged about a week ago and to follow-up with GI outpatient.  Since discharge patient has still had persistent bleeding with BM about a cup of blood per day.    Patient presents to [facility] on 8/10 with GI bleed.  Patient mildly hypotensive, otherwise hemodynamically stable.  Afebrile.  On room air.  Initial Hgb 5.9.  Given 2 units PRBCs.    Patient admitted for treatment and stabilization.  Today is day 6 of this hospitalization.  CT chest significant for new occlusive debris present in left bronchus, bilateral pleural effusions, left concerning for empyema, lung nodules.   Patient has family face treatment option decisions, advance directive decisions and anticipatory care needs.    Clinical Assessment and Goals of Care:  This NP Wadie Lessen reviewed medical records, received report from team, assessed the patient and then meet at the patient's bedside along with daughter/ [name] and son/Keith  to discuss diagnosis, prognosis, GOC, EOL wishes disposition and options.   Concept of Palliative Care was introduced as  specialized medical care for people and their families living with serious illness.  If focuses on providing relief from the symptoms and stress of a serious illness.  The goal is to improve quality of life for both the patient and the family.   Values and goals of care important to patient and family were attempted to be elicited.  Created space and opportunity for patient  and family to explore thoughts and feelings regarding current medical situation.  Family verbalize a continued physical and functional decline over the past many months and patient's increasing resistance to continue with dialysis.  Patient himself acknowledges this and communicates feelings leaning toward a more comfort path, however today he needs more time to think about things.  Patient does make decision today for DNR/DNI.     A  discussion was had today regarding advanced directives.  Concepts specific to code status, artifical feeding and hydration, continued IV antibiotics and rehospitalization was had.  Education specifically to patient's decision to stop dialysis, the natural trajectory of end-stage renal disease at end-of-life and the likely prognosis of less than a few weeks offered.  The difference between a aggressive medical intervention path  and a  palliative comfort care path for this patient at this time was had.   Education offered on hospice benefit; philosophy and eligibility  MOST form introduced and hard choices booklet left for review    Questions and concerns addressed.  Patient  encouraged to call with questions or concerns.     PMT will continue to support holistically.   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      No documented healthcare power of attorney.  Patient is alert and oriented and making his own decisions.   Patient does depend on his 3 children to support  him and decision making.     SUMMARY OF RECOMMENDATIONS    Code Status/Advance Care Planning: DNR-documented today   Palliative Prophylaxis:  Aspiration, Bowel Regimen, Delirium Protocol, Frequent Pain Assessment, and Oral Care  Additional Recommendations (Limitations, Scope, Preferences): Full Scope Treatment  Psycho-social/Spiritual:  Desire for further Chaplaincy support:no-declined Additional Recommendations: Education on Hospice  Prognosis:  Unable to determine  Discharge Planning: To Be Determined      Primary Diagnoses: Present on Admission:  GI bleed   I have reviewed the medical record, interviewed the patient and family, and examined the patient. The following aspects are pertinent.  Past Medical History:  Diagnosis Date   Anemia    Arthritis    Atrial fibrillation (HCC)    CHF (congestive heart failure) (HCC)    Chronic kidney disease-mineral and bone disorder    ESRD on hemodialysis ([place])    takes HD in [place] on MWF schedule   Headache    HOH (hard of hearing)    Hyperlipidemia    Hypertension    Hypothyroidism    Peripheral vascular disease (HCC)    Proteinuria    Sleep apnea    wears CPAP 12   Type 2 diabetes mellitus with stage 5 chronic kidney disease ([place])    Wears glasses    Social History   Socioeconomic History   Marital status: Widowed    Spouse name: Not on file   Number of children: Not on file   Years of education: Not on file   Highest education level: Not on file  Occupational History   Not on file  Tobacco Use   Smoking status: Every Day    Packs/day: 0.25    Types: Cigars, Cigarettes   Smokeless tobacco: Never  Vaping Use   Vaping Use: Never used  Substance and Sexual Activity   Alcohol use: Yes    Comment: rarely   Drug use: Never   Sexual activity: Not on file  Other Topics Concern   Not on file  Social History Narrative   Not on file   Social Determinants of Health   Financial Resource Strain: Not on  file  Food Insecurity: Not on file  Transportation Needs: Not on file  Physical Activity: Not on file  Stress: Not on file  Social Connections: Not on file   Family History  Problem Relation Age of Onset   Lung cancer Father    Scheduled Meds:  atorvastatin  40 mg Oral QHS   Chlorhexidine Gluconate Cloth  6 each Topical Daily   Chlorhexidine Gluconate Cloth  6 each Topical Q0600   cyanocobalamin  1,000 mcg Intramuscular Q7 days   darbepoetin (ARANESP) injection - DIALYSIS  60 mcg Intravenous Q Wed-HD   insulin aspart  1-3  Units Subcutaneous Q4H   levothyroxine  25 mcg Oral QAC breakfast   midodrine  20 mg Oral Q8H   pantoprazole  40 mg Oral Daily   rOPINIRole  0.5 mg Oral QHS   sodium chloride flush  3 mL Intravenous Q12H   Continuous Infusions:  calcium gluconate     ceFEPime (MAXIPIME) IV     heparin 1,250 Units/hr (05/26/22 0902)   metronidazole Stopped (05/26/22 0715)   norepinephrine (LEVOPHED) Adult infusion Stopped (05/26/22 0722)   vancomycin     PRN Meds:.benzonatate, docusate sodium, ipratropium-albuterol, mouth rinse, polyethylene glycol Medications Prior to Admission:  Prior to Admission medications   Medication Sig Start Date End Date Taking? Authorizing Provider  atorvastatin (LIPITOR) 40 MG tablet Take 40 mg by mouth at bedtime.    Yes Fran Lowes, MD  carvedilol (COREG) 3.125 MG tablet Take 3.125 mg by mouth 2 (two) times daily with a meal.   Yes [provider]  Cholecalciferol (VITAMIN D3) 125 MCG (5000 UT) CAPS Take 5,000 Units by mouth every evening. 03/11/22  Yes [provider]  levothyroxine (SYNTHROID) 25 MCG tablet Take 25 mcg by mouth daily before breakfast.   Yes [provider]  Methoxy PEG-Epoetin Beta (MIRCERA IJ) Inject as directed as needed (Low hemoglobin).   Yes [provider]  Multiple Vitamin (DAILY VITE PO) Take 1 tablet by mouth every evening.   Yes [provider]  pantoprazole  (PROTONIX) 40 MG tablet Take 40 mg by mouth at bedtime.    Yes Fran Lowes, MD  rOPINIRole (REQUIP) 0.5 MG tablet Take 0.5 mg by mouth at bedtime.   Yes [provider]  terazosin (HYTRIN) 1 MG capsule Take 1 mg by mouth at bedtime.   Yes Fran Lowes, MD  XARELTO 15 MG TABS tablet Take 1 tablet (15 mg total) by mouth daily with supper. Patient not taking: Reported on 05/21/2022 04/10/20   Roxan Hockey, MD   Allergies  Allergen Reactions   Penicillins Rash and Other (See Comments)    Did it involve swelling of the face/tongue/throat, SOB, or low BP? No Did it involve sudden or severe rash/hives, skin peeling, or any reaction on the inside of your mouth or nose? Yes Did you need to seek medical attention at a hospital or doctor's office? Unknown When did it last happen?   60 years    If all above answers are "NO", may proceed with cephalosporin use.   Quinine     UNSPECIFIED REACTION    Review of Systems  Neurological:  Positive for weakness.    Physical Exam Cardiovascular:     Rate and Rhythm: Normal rate.  Pulmonary:     Effort: Pulmonary effort is normal.  Skin:    General: Skin is warm and dry.  Neurological:     Mental Status: He is alert.     Vital Signs: BP 114/61 (BP Location: Right Arm)   Pulse 66   Temp 98 F (36.7 C) (Oral)   Resp 18   Ht 5\' 9"  (1.753 m)   Wt 75.6 kg   SpO2 100%   BMI 24.61 kg/m  Pain Scale: 0-10   Pain Score: 0-No pain   SpO2: SpO2: 100 % O2 Device:SpO2: 100 % O2 Flow Rate: .O2 Flow Rate (L/min): 2 L/min  IO: Intake/output summary:  Intake/Output Summary (Last 24 hours) at 05/26/2022 1032 Last data filed at 05/26/2022 0900 Gross per 24 hour  Intake 1770.78 ml  Output --  Net 1770.78 ml  LBM: Last BM Date : 05/25/22 Baseline Weight: Weight: 72.9 kg Most recent weight: Weight: 75.6 kg     Palliative Assessment/Data:     Discussed with Dr. Carlis Abbott and bedside RN    Signed by: Wadie Lessen, NP    Please contact Palliative Medicine Team phone at [phone] for questions and concerns.  For individual provider: See Shea Evans

## 2022-05-26 NOTE — Telephone Encounter (Signed)
Pharmacy Patient Advocate Encounter  Insurance verification completed.    The patient is insured through [employer] Medicare Part D   The patient is currently admitted and ran test claims for the following: Eliquis.  Copays and coinsurance results were relayed to Inpatient clinical team.  

## 2022-05-26 NOTE — Progress Notes (Signed)
Per Fuller Song, ok to use centraline, hold off on aline till evaluated further in am . Currently able to obtain BP VIA right extremity

## 2022-05-26 NOTE — Progress Notes (Signed)
[place] for heparin Indication: atrial fibrillation  Allergies  Allergen Reactions   Penicillins Rash and Other (See Comments)    Did it involve swelling of the face/tongue/throat, SOB, or low BP? No Did it involve sudden or severe rash/hives, skin peeling, or any reaction on the inside of your mouth or nose? Yes Did you need to seek medical attention at a hospital or doctor's office? Unknown When did it last happen?   60 years    If all above answers are "NO", may proceed with cephalosporin use.   Quinine     UNSPECIFIED REACTION     Patient Measurements: Height: 5\' 9"  (175.3 cm) Weight: 75.6 kg (166 lb 10.7 oz) IBW/kg (Calculated) : 70.7 Heparin Dosing Weight: 74.7 kg   Vital Signs: Temp: 97.5 F (36.4 C) (08/16 0314) Temp Source: Axillary (08/16 0314) BP: 133/76 (08/16 0600) Pulse Rate: 88 (08/16 0600)  Labs: Recent Labs    05/24/22 0036 05/25/22 0059 05/25/22 1140 05/25/22 2129 05/26/22 0120 05/26/22 0543  HGB 8.6*  --  9.2*  --  9.5*  --   HCT 26.3*  --  27.8*  --  29.9*  --   PLT 133*  --  143*  --  183  --   HEPARINUNFRC  --   --   --  0.15*  --  0.30  CREATININE 3.50* 2.46*  --   --  3.47*  --      Estimated Creatinine Clearance: 15.6 mL/min (A) (by C-G formula based on SCr of 3.47 mg/dL (H)).   Medical History: Past Medical History:  Diagnosis Date   Anemia    Arthritis    Atrial fibrillation (HCC)    CHF (congestive heart failure) (HCC)    Chronic kidney disease-mineral and bone disorder    ESRD on hemodialysis ([place])    takes HD in [place] on MWF schedule   Headache    HOH (hard of hearing)    Hyperlipidemia    Hypertension    Hypothyroidism    Peripheral vascular disease (HCC)    Proteinuria    Sleep apnea    wears CPAP 12   Type 2 diabetes mellitus with stage 5 chronic kidney disease (HCC)    Wears glasses     Medications:  Infusions:   calcium gluconate     ceFEPime (MAXIPIME) IV      heparin 1,250 Units/hr (05/26/22 0029)   metronidazole 500 mg (05/26/22 0615)   norepinephrine (LEVOPHED) Adult infusion 2 mcg/min (05/26/22 0545)   vancomycin      Assessment: GA is a 87 y/o male with a history of Afib who was found to have a GIB. He was taking Xarelto PTA, last dose 7/26. Pharmacy has been consulted to restart anticoagulation with heparin.   Heparin level therapeutic at 0.30, no S/Sx bleeding noted from nurse. Some issues with the line around midnight, heparin stopped briefly. Since level is just inside the therapeutic range, will continue the current infusion rate   Goal of Therapy:  Heparin level 0.3-0.7 units/ml Monitor platelets by anticoagulation protocol: Yes   Plan:  Continue heparin to 1250 units/h Recheck heparin level in 8h  Vicenta Dunning, PharmD  PGY1 Pharmacy Resident

## 2022-05-27 ENCOUNTER — Inpatient Hospital Stay (HOSPITAL_COMMUNITY): Payer: Medicare Other

## 2022-05-27 ENCOUNTER — Other Ambulatory Visit (HOSPITAL_COMMUNITY): Payer: Self-pay

## 2022-05-27 DIAGNOSIS — R918 Other nonspecific abnormal finding of lung field: Secondary | ICD-10-CM | POA: Diagnosis not present

## 2022-05-27 DIAGNOSIS — R0609 Other forms of dyspnea: Secondary | ICD-10-CM

## 2022-05-27 DIAGNOSIS — R531 Weakness: Secondary | ICD-10-CM | POA: Diagnosis not present

## 2022-05-27 DIAGNOSIS — J189 Pneumonia, unspecified organism: Secondary | ICD-10-CM

## 2022-05-27 DIAGNOSIS — N186 End stage renal disease: Secondary | ICD-10-CM | POA: Diagnosis not present

## 2022-05-27 DIAGNOSIS — Z515 Encounter for palliative care: Secondary | ICD-10-CM | POA: Diagnosis not present

## 2022-05-27 DIAGNOSIS — A419 Sepsis, unspecified organism: Secondary | ICD-10-CM | POA: Diagnosis not present

## 2022-05-27 DIAGNOSIS — D649 Anemia, unspecified: Secondary | ICD-10-CM | POA: Diagnosis not present

## 2022-05-27 DIAGNOSIS — J918 Pleural effusion in other conditions classified elsewhere: Secondary | ICD-10-CM

## 2022-05-27 DIAGNOSIS — R6520 Severe sepsis without septic shock: Secondary | ICD-10-CM

## 2022-05-27 LAB — CBC
HCT: 27.9 % — ABNORMAL LOW (ref 39.0–52.0)
Hemoglobin: 9.1 g/dL — ABNORMAL LOW (ref 13.0–17.0)
MCH: 31.1 pg (ref 26.0–34.0)
MCHC: 32.6 g/dL (ref 30.0–36.0)
MCV: 95.2 fL (ref 80.0–100.0)
Platelets: 162 10*3/uL (ref 150–400)
RBC: 2.93 MIL/uL — ABNORMAL LOW (ref 4.22–5.81)
RDW: 17.4 % — ABNORMAL HIGH (ref 11.5–15.5)
WBC: 9.2 10*3/uL (ref 4.0–10.5)
nRBC: 0 % (ref 0.0–0.2)

## 2022-05-27 LAB — BODY FLUID CULTURE W GRAM STAIN: Culture: NO GROWTH

## 2022-05-27 LAB — BASIC METABOLIC PANEL
Anion gap: 8 (ref 5–15)
BUN: 10 mg/dL (ref 8–23)
CO2: 29 mmol/L (ref 22–32)
Calcium: 7.8 mg/dL — ABNORMAL LOW (ref 8.9–10.3)
Chloride: 97 mmol/L — ABNORMAL LOW (ref 98–111)
Creatinine, Ser: 2.48 mg/dL — ABNORMAL HIGH (ref 0.61–1.24)
GFR, Estimated: 25 mL/min — ABNORMAL LOW (ref >60)
Glucose, Bld: 113 mg/dL — ABNORMAL HIGH (ref 70–99)
Potassium: 3.7 mmol/L (ref 3.5–5.1)
Sodium: 134 mmol/L — ABNORMAL LOW (ref 135–145)

## 2022-05-27 LAB — GLUCOSE, CAPILLARY
Glucose-Capillary: 94 mg/dL (ref 70–99)
Glucose-Capillary: 120 mg/dL — ABNORMAL HIGH (ref 70–99)
Glucose-Capillary: 66 mg/dL — ABNORMAL LOW (ref 70–99)
Glucose-Capillary: 146 mg/dL — ABNORMAL HIGH (ref 70–99)
Glucose-Capillary: 153 mg/dL — ABNORMAL HIGH (ref 70–99)
Glucose-Capillary: 185 mg/dL — ABNORMAL HIGH (ref 70–99)
Glucose-Capillary: 174 mg/dL — ABNORMAL HIGH (ref 70–99)

## 2022-05-27 LAB — MAGNESIUM: Magnesium: 1.7 mg/dL (ref 1.7–2.4)

## 2022-05-27 LAB — HEPARIN LEVEL (UNFRACTIONATED): Heparin Unfractionated: 0.59 IU/mL (ref 0.30–0.70)

## 2022-05-27 MED ORDER — AMOXICILLIN-POT CLAVULANATE 500-125 MG PO TABS
500.0000 mg | ORAL_TABLET | Freq: Two times a day (BID) | ORAL | Status: DC
  Administered 2022-05-27 – 2022-06-01 (×10): 500 mg via ORAL
  Filled 2022-05-27 (×12): qty 1

## 2022-05-27 MED ORDER — MORPHINE SULFATE (CONCENTRATE) 10 MG/0.5ML PO SOLN: 5.0000 mg | ORAL | Status: DC | PRN

## 2022-05-27 MED ORDER — MAGNESIUM SULFATE 2 GM/50ML IV SOLN
2.0000 g | Freq: Once | INTRAVENOUS | Status: AC
Start: 2022-05-27 — End: 2022-05-27
  Administered 2022-05-27: 2 g via INTRAVENOUS
  Filled 2022-05-27: qty 50

## 2022-05-27 MED ORDER — HYDROMORPHONE HCL 2 MG PO TABS
1.0000 mg | ORAL_TABLET | ORAL | Status: DC | PRN
  Administered 2022-05-27: 1 mg via ORAL
  Filled 2022-05-27: qty 1

## 2022-05-27 MED ORDER — GUAIFENESIN ER 600 MG PO TB12
600.0000 mg | ORAL_TABLET | Freq: Two times a day (BID) | ORAL | Status: DC
  Filled 2022-05-27 (×2): qty 1

## 2022-05-27 MED ORDER — GUAIFENESIN-DM 100-10 MG/5ML PO SYRP
5.0000 mL | ORAL_SOLUTION | ORAL | Status: DC | PRN
Start: 2022-05-27 — End: 2022-06-02
  Administered 2022-05-28 – 2022-05-30 (×3): 5 mL via ORAL
  Filled 2022-05-27 (×4): qty 10

## 2022-05-27 MED ORDER — APIXABAN 2.5 MG PO TABS
2.5000 mg | ORAL_TABLET | Freq: Two times a day (BID) | ORAL | Status: DC
  Administered 2022-05-27 – 2022-06-01 (×11): 2.5 mg via ORAL
  Filled 2022-05-27 (×11): qty 1

## 2022-05-27 NOTE — Progress Notes (Signed)
Pt transported to [facility] uneventfully, with cellphone, tablet, charger, glasses, and all other belongings.

## 2022-05-27 NOTE — Progress Notes (Signed)
Patient signed out to triad. They will assume care at [facility] 8/18.

## 2022-05-27 NOTE — Progress Notes (Signed)
Pt requested to be placed on cpap for the night, 2L bleed in. Pt is tolerating it well at this time.

## 2022-05-27 NOTE — Progress Notes (Signed)
[place] for heparin Indication: atrial fibrillation  Allergies  Allergen Reactions   Penicillins Rash and Other (See Comments)    Did it involve swelling of the face/tongue/throat, SOB, or low BP? No Did it involve sudden or severe rash/hives, skin peeling, or any reaction on the inside of your mouth or nose? Yes Did you need to seek medical attention at a hospital or doctor's office? Unknown When did it last happen?   60 years    If all above answers are "NO", may proceed with cephalosporin use.   Quinine     UNSPECIFIED REACTION     Patient Measurements: Height: 5\' 9"  (175.3 cm) Weight: 73.2 kg (161 lb 6 oz) IBW/kg (Calculated) : 70.7 Heparin Dosing Weight: 74.7 kg   Vital Signs: Temp: 98 F (36.7 C) (08/16 2335) Temp Source: Axillary (08/16 2335) BP: 111/55 (08/16 2300) Pulse Rate: 89 (08/16 2335)  Labs: Recent Labs    05/25/22 0059 05/25/22 1140 05/25/22 2129 05/26/22 0120 05/26/22 0543 05/26/22 1437 05/26/22 2310  HGB  --  9.2*  --  9.5*  --   --   --   HCT  --  27.8*  --  29.9*  --   --   --   PLT  --  143*  --  183  --   --   --   HEPARINUNFRC  --   --    < >  --  0.30 <0.10* 0.57  CREATININE 2.46*  --   --  3.47*  --   --   --    < > = values in this interval not displayed.     Estimated Creatinine Clearance: 15.6 mL/min (A) (by C-G formula based on SCr of 3.47 mg/dL (H)).   Assessment: GA is a 87 y/o male with a history of Afib who was found to have a GIB. He was taking Xarelto PTA, last dose 7/26. Pharmacy has been consulted to restart anticoagulation with heparin.   Heparin level therapeutic (0.57) on infusion at 1250 units/hr. No bleeding noted.  Goal of Therapy:  Heparin level 0.3-0.7 units/ml Monitor platelets by anticoagulation protocol: Yes   Plan:  Continue heparin at 1250 units/h F/u daily heparin level and CBC  Sherlon Handing, PharmD, BCPS Please see amion for complete clinical pharmacist phone  list 05/27/2022 12:44 AM

## 2022-05-27 NOTE — Discharge Instructions (Signed)

## 2022-05-27 NOTE — Progress Notes (Signed)
[facility] Progress Note  Subjective: patient seen and examined bedside in room. Hemodynamically stable, on mido 20mg  TID. Tolerated HD yesterday w/ net uf 1.9L, he reports feeling better today as compared to yesterday. No other complaints  Vitals:   05/27/22 0100 05/27/22 0200 05/27/22 0322 05/27/22 0500  BP: (!) 114/52 123/64    Pulse: 82 67    Resp: 19 18    Temp:   97.7 F (36.5 C)   TempSrc:   Axillary   SpO2: 93% 97%    Weight:    76 kg  Height:       Exam: Gen: NAD,laying flat in bed CV: RRR Resp: decreased air entry bibasilar Abd: soft, nt/nd Ext: no sig thigh edema b/l, rt aka, lt bka Neuro: awake, alert Dialysis access: LUE AVF +b/t   Recent Labs  Lab 05/20/22 1313 05/20/22 2105 05/22/22 1423 05/22/22 1737 05/26/22 0120 05/27/22 0006  HGB 5.9*   < > 8.7*   < > 9.5* 9.1*  ALBUMIN 2.3*  --   --   --   --   --   CALCIUM 7.8*   < > 7.4*   < > 7.9* 7.8*  PHOS  --   --  2.8  --   --   --   CREATININE 2.35*   < > 2.14*   < > 3.47* 2.48*  K 3.1*   < > 3.2*   < > 3.9 3.7   < > = values in this interval not displayed.   Recent Labs  Lab 05/20/22 1313  IRON 78  TIBC 147*  FERRITIN 1,069*   Inpatient medications:  atorvastatin  40 mg Oral QHS   Chlorhexidine Gluconate Cloth  6 each Topical Daily   Chlorhexidine Gluconate Cloth  6 each Topical Q0600   cyanocobalamin  1,000 mcg Intramuscular Q7 days   darbepoetin (ARANESP) injection - DIALYSIS  60 mcg Intravenous Q Wed-HD   insulin aspart  1-3 Units Subcutaneous Q4H   levothyroxine  25 mcg Oral QAC breakfast   midodrine  20 mg Oral Q8H   pantoprazole  40 mg Oral Daily   rOPINIRole  0.5 mg Oral QHS   sodium chloride flush  10-40 mL Intracatheter Q12H   sodium chloride flush  3 mL Intravenous Q12H    ceFEPime (MAXIPIME) IV Stopped (05/26/22 1859)   heparin 1,250 Units/hr (05/27/22 0800)   magnesium sulfate bolus IVPB 2 g (05/27/22 0804)   metronidazole Stopped (05/27/22 0751)    norepinephrine (LEVOPHED) Adult infusion Stopped (05/26/22 0722)   benzonatate, docusate sodium, ipratropium-albuterol, mouth rinse, polyethylene glycol, sodium chloride flush    OP HD: MWF [facility] (> per Care Everywhere)  3h 68min  400/2.0  73kg  2/2.5 bath  LUA AVG   16ga  Hep ? 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 S/p PRBCs. Hgb stable Shock - due to hemorrhage as above, not on pressors at this time Aspiration Pna: on vanc/cefepime. Possible chest tube? Per PCCM Pleural effusions/ lung mass - by CT. Is being f/b doctor in [place] for lung mass. Per CCM. S/p thora 8/13-850cc drained. Pressor support per primary. Palliative consulted ESRD - on HD MWF in [place]. Will c/w MWF schedule. Anemia esrd - Hb 5.9 on admission.. Fe replete on panel. . Hgb 9.1--stopped ESA given lung mass and concerning  PET scan MBD ckd - CCa in range, phos was low.  No binders needed. Cont to monitor, replete if needed Atrial fib -restarted on hep gtt HTN/volume - on pressors as above. UF plan as above

## 2022-05-27 NOTE — Progress Notes (Addendum)
NAME:  Terry Graves., MRN:  [id number], DOB:  04-Oct-1935, LOS: 0 ADMISSION DATE:  05/20/2022, CONSULTATION DATE:  8/10 REFERRING MD:  Dr. Billy Fischer, CHIEF COMPLAINT:  GI bleed   History of Present Illness:  Patient is a 87 year old male with pertinent PMH of ESRD (dialysis M, W, F), hypothyroidism, GERD, HFpEF, chronic A-fib on Xarelto (off Xarelto since 7/26), OSA on CPAP, recent GI bleed presents to [facility] ED on 8/10 with GI bleed.  Patient was recently admitted to [facility] on 7/26.  Noted to have colonic diverticulitis and small bowel AVM bleed.  Home Xarelto was held.  Patient given PRBC for low hemoglobin.  Patient was discharged about a week ago and to follow-up with GI outpatient.  Since discharge patient has still had persistent bleeding with BM about a cup of blood per day.  States he really has not had no hematemesis, maybe once.  No heavy EtOH abuse.  Denies fevers, SOB, chest pain.  Does have some elbow pain and LLQ with bowel movements.   Patient presents to [facility] on 8/10 with GI bleed.  Patient mildly hypotensive, otherwise hemodynamically stable.  Afebrile.  On room air.  Initial Hgb 5.9.  Given 2 units PRBCs.  Patient's MAP less than 65 despite PRBCs. K 3.1, creat 2.35 (close to baseline), WBC 4.9.  PCCM consulted for ICU admission.  Pertinent  Medical History   Past Medical History:  Diagnosis Date   Anemia    Arthritis    Atrial fibrillation (HCC)    CHF (congestive heart failure) (HCC)    Chronic kidney disease-mineral and bone disorder    ESRD on hemodialysis ([place])    takes HD in [place] on MWF schedule   Headache    HOH (hard of hearing)    Hyperlipidemia    Hypertension    Hypothyroidism    Peripheral vascular disease (HCC)    Proteinuria    Sleep apnea    wears CPAP 12   Type 2 diabetes mellitus with stage 5 chronic kidney disease ([place])    Wears glasses     Significant Hospital Events: Including procedures, antibiotic start and  stop dates in addition to other pertinent events   8/10 Admitted to [facility] ICU and transfused 2 units 8/12 7-10 bloody bowel movements, prep received yesterday for planned colonoscopy today 8/13 R thoracentesis 8/14 low Bps, remained in ICU 8/15 central line placed 8/16 Code status changed to DNR  Interim History / Subjective:  Blood pressures improved overnight.  Subjective: He reports feeling mildly better this AM. Breathing feels about the same. He continues to have trouble swallowing food. He was able to eat some eggs and sausage for breakfast.  Objective   Blood pressure 123/64, pulse 67, temperature 97.7 F (36.5 C), temperature source Axillary, resp. rate 18, height 5\' 9"  (1.753 m), weight 76 kg, SpO2 97 %.        Intake/Output Summary (Last 24 hours) at 05/27/2022 0627 Last data filed at 05/27/2022 0200 Gross per 24 hour  Intake 852.36 ml  Output 1900 ml  Net -1047.64 ml    Filed Weights   05/26/22 0403 05/26/22 1825 05/27/22 0500  Weight: 75.6 kg 73.2 kg 76 kg    Examination: General: Elderly male, in no acute distress HEENT: MM pink/moist Neuro: Aox3; MAE CV: irregularly irregular, rate 80-100s, no m/r/g PULM:  decreased breath sounds to lungs bases bilaterally, no wheezing or rhonchi, breathing comfortably on 2LNC GI: soft, bsx4 active, no tenderness Extremities:  warm/dry, L BKA, R AKA; LUE fistula without erythema and palpable thrill Skin: no rashes or lesions appreciated  Na 134 K 3.7, ESRD Mg 1.7, repleted Hgb 9.1 WBC 9.2 Glucose 90-100s  Net + 6L  Pleural protein/ serum ratio 0.6 Pleural LDH/ serum LDH 0.39 LDH > 2/3 upper limit of normal Mononuclear cells  Cytology no malignant cells  CT chest showed no acute PE> New occlusive debris present in left bronchus. Moderate left pleural effusion with empyema versus chronic pleural effusion. Large right pleural effusion, moderate left pleural effusion. Mass in upper right lobe  Resolved Hospital Problem  list   Acute blood loss anemia  Assessment & Plan:   ?Empyema vs chronic pleural effusion L Large right pleural effusion  Aspiration pneumonia CT chest showed bilateral pleural effusions. The left has findings concerning for empyema. Location of infection is not amenable to chest tube placement by pulmonology or IR. He will have to stay on longer duration of antibiotics MRSA was - 8/16 and vancomycin discontinued. Will switch cefepime to augmentin. He will need 4-6 weeks of oral antibiotics. -Cefepime to augmentin, day 2 -speech following, esophagram ordered  Hypotension-resolved Blood pressure improved overnight with antibiotic treatment and increased dose of midodrine. Thinking that he had worsening hypotension in setting of empyema. -goal SBP >90 -midodrine 20mg  q8 hrs  Lungs masses PET scan with multifocal hypermetabolic masses Patient had pathologic fracture in 4/23. He has not had definitive work-up for diagnosis of cancer. PET scan showed multiple hypermetabolic masses in his lungs. I talked with daughter and she stated that she has to convince her dad to go to dialysis and that he feels miserable afterwards. We talked about likely that biopsy would reflect cancer and he would be a poor candidate for chemotherapy. 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 He received 1 unit of blood yesterday for a total of 3 units this admission. No further episodes of blood in stool. Heparin started 8/15. Pathology of polyp negative for high-grade dysplasia. -stop heparin, start eliquis -GI signed off  ESRD HD MWF HD 8/15 per nephro. Continue MWF. - strict I+Os, daily weights -Trend BMP -Replace electrolytes as indicated -Avoid nephrotoxic agents, ensure adequate renal  perfusion  HFrEF HTN 7/20 EF at 35-40%. Echo ordered 8/11, pending. Net + 9L. He does not make urine. Appears euvolemic on exam. - holding home antihypertensives -Daily weights; strict I/os  Afib No recent Xarelto use. -Telemetry monitoring -currently rate controlled. BB being held due to hypotension -dc heparin -restart eliquis  OSA - CPAP QHS  DM - CBG monitoring - sensitive SSI  Hypothyroidism Continue synthroid  GERD - PPI  PVD hx BL BKA -restart home ropinirole -mobilize in bed as able to avoid pressure sores   Stable for transfer to triad  Best Practice (right click and "Reselect all SmartList Selections" daily)   Diet/type: dysphagia diet (see orders) DVT prophylaxis: prophylactic heparin  GI prophylaxis: N/A Lines: N/A Foley:  N/A Code Status:  full code Last date of multidisciplinary goals of care discussion [spoke w daughter, Ms. Adams 8/16]  Daleen Bo. Eveline Sauve, D.O.  Internal Medicine Resident, PGY-2 [facility]  Pager: [phone] 6:27 AM, 05/27/2022

## 2022-05-27 NOTE — Progress Notes (Signed)
Patient ID: Terry Graves., male   DOB: 1935/09/28, 87 y.o.   MRN: [id number]    Progress Note from the Palliative Medicine Team at [facility]   Patient Name: Terry Graves.        Date: 05/27/2022 DOB: [birth year]  Age: 87 y.o. MRN#: [id number] Attending Physician: Terry Hy, DO Primary Care Physician: Terry Scarce, MD Admit Date: 05/20/2022   Medical records reviewed   87 y.o. male   admitted on 05/20/2022 with  Patient is a 87 year old male with pertinent PMH of ESRD (dialysis M, W, F), hypothyroidism, GERD, HFpEF, chronic A-fib on Xarelto (off Xarelto since 7/26), OSA on CPAP, recent GI bleed presents to [facility] ED on 8/10 with GI bleed.  Patient was recently admitted to [facility] on 7/26.  Noted to have colonic diverticulitis and small bowel AVM bleed.  Home Xarelto was held.  Patient given PRBC for low hemoglobin.  Patient was discharged about a week ago and to follow-up with GI outpatient.  Since discharge patient has still had persistent bleeding with BM about a cup of blood per day.     Patient presents to [facility] on 8/10 with GI bleed.  Patient mildly hypotensive, otherwise hemodynamically stable.  Afebrile.  On room air.  Initial Hgb 5.9.  Given 2 units PRBCs.     Patient admitted for treatment and stabilization.  Today is day 6 of this hospitalization.   CT chest significant for new occlusive debris present in left bronchus, bilateral pleural effusions, left concerning for empyema, lung nodules.  See PET scan report in hard chart   Patient has family face treatment option decisions, advance directive decisions and anticipatory care needs.    This NP assessed patient at the bedside as a follow up to  yesterday's Terry Graves, for palliative medicine needs and emotional support and to meet with 2 daughters/Terry Graves/Terry Graves as scheduled for continued conversation regarding current medical situation.  Met with Terry Graves at bedside along with his 2  daughters.  Patient is alert and oriented.  This morning he has had complaints of a feeling that" it is hard to breathe ".   Vital signs stable O2 sats at 99%, no obvious distress.  - discussed use of low dose opioid for dyspnea and patient in agreement-  Dilaudid 1 mg po every 4 hours as needed  - recommendation for guaifenesin   Continue conversation regarding the seriousness and the complexity of patient's current medical situation secondary to likely lung cancer, CHF, end-stage renal disease on dialysis, DM and overall failure to thrive.  Education offered today regarding  the importance of continued conversation with family and their  medical providers regarding overall plan of care and treatment options,  ensuring decisions are within the context of the patients values and GOCs.   Plan of care -DNR/DNI -Patient is open to all offered and available medical interventions to prolong life.  He wishes to continue dialysis -would consider OP oncology work-up if able in the future - when medically stable discharge home to daughter/Terry Graves's home    Patient is high risk for decompensation, he and family  will need ongoing conversation and support dependant on outcomes over the next days to weeks.   Questions and concerns addressed   Discussed with Dr Carlis Abbott   This nurse practitioner informed  the patient/family and the attending that I will be out of the hospital until Sunday  morning.  If the patient is still hospitalized I will  follow-up at that time.  Call palliative medicine team phone # [phone] with questions or concerns in the interim    Wadie Lessen NP  Palliative Medicine Team Team Phone # [phone] Pager [phone]

## 2022-05-27 NOTE — Progress Notes (Signed)
New cpap order, Pt agreed to wear cpap tonight but will have nurse call RT when ready.

## 2022-05-27 NOTE — Progress Notes (Signed)
Modified Barium Swallow Progress Note  Patient Details  Name: Terry Graves. MRN: [id number] Date of Birth: 12/17/1934  Today's Date: 05/27/2022  Modified Barium Swallow completed.  Full report located under Chart Review in the Imaging Section.  Brief recommendations include the following:  Clinical Impression  Patient presents with a mild oral phase dysphagia and a pharyngeal phase of swallow that appears [facility]. He exhibited brief delays with anterior to posterior transit with solids but no oral residuals and good bolus cohesion. Swallow initiation was timely for all tested consistencies (thin and nectar thick liquids, puree solids, 20mm barium tablet and regular solids). No penetration or aspiration observed with liquids or solids. When swallowing barium tablet with thin liquid barium, patient had difficulty keeping a cohesive bolus and benefited from sips of nectar thick liquids to help transit. Tablet became briefly lodged at level of UES but otherwise transited without difficulty. Esophageal sweep revealed retrograde movement of thin liquid barium in distal esophagus as well as barium tablet briefly stuck in distal portion of esophagus. He had frequent belching during the MBS and reported to SLP that he has been coughing up a log of phlegm today. SLP is recommending continue on Dys 3 solids, thin liquids. SLP will plan to f/u at least one more time to ensure toleration but suspect that his symptoms are occuring at the esophageal phase.   Swallow Evaluation Recommendations       SLP Diet Recommendations: Regular solids;Dysphagia 3 (Mech soft) solids;Thin liquid   Liquid Administration via: Cup;Straw   Medication Administration: Whole meds with puree   Supervision: Patient able to self feed;Intermittent supervision to cue for compensatory strategies   Compensations: Slow rate;Small sips/bites   Postural Changes: Seated upright at 90 degrees;Remain semi-upright after after feeds/meals  (Comment) (30-45 minutes)   Oral Care Recommendations: Oral care BID       Sonia Baller, MA, CCC-SLP Speech Therapy

## 2022-05-28 DIAGNOSIS — K922 Gastrointestinal hemorrhage, unspecified: Secondary | ICD-10-CM | POA: Diagnosis not present

## 2022-05-28 LAB — BASIC METABOLIC PANEL
Anion gap: 9 (ref 5–15)
BUN: 14 mg/dL (ref 8–23)
CO2: 26 mmol/L (ref 22–32)
Calcium: 8.3 mg/dL — ABNORMAL LOW (ref 8.9–10.3)
Chloride: 95 mmol/L — ABNORMAL LOW (ref 98–111)
Creatinine, Ser: 3.27 mg/dL — ABNORMAL HIGH (ref 0.61–1.24)
GFR, Estimated: 18 mL/min — ABNORMAL LOW (ref >60)
Glucose, Bld: 88 mg/dL (ref 70–99)
Potassium: 4.2 mmol/L (ref 3.5–5.1)
Sodium: 130 mmol/L — ABNORMAL LOW (ref 135–145)

## 2022-05-28 LAB — CBC
HCT: 29.6 % — ABNORMAL LOW (ref 39.0–52.0)
Hemoglobin: 9.6 g/dL — ABNORMAL LOW (ref 13.0–17.0)
MCH: 31.4 pg (ref 26.0–34.0)
MCHC: 32.4 g/dL (ref 30.0–36.0)
MCV: 96.7 fL (ref 80.0–100.0)
Platelets: 159 10*3/uL (ref 150–400)
RBC: 3.06 MIL/uL — ABNORMAL LOW (ref 4.22–5.81)
RDW: 17.7 % — ABNORMAL HIGH (ref 11.5–15.5)
WBC: 9.3 10*3/uL (ref 4.0–10.5)
nRBC: 0 % (ref 0.0–0.2)

## 2022-05-28 LAB — GLUCOSE, CAPILLARY
Glucose-Capillary: 100 mg/dL — ABNORMAL HIGH (ref 70–99)
Glucose-Capillary: 136 mg/dL — ABNORMAL HIGH (ref 70–99)
Glucose-Capillary: 144 mg/dL — ABNORMAL HIGH (ref 70–99)
Glucose-Capillary: 168 mg/dL — ABNORMAL HIGH (ref 70–99)
Glucose-Capillary: 67 mg/dL — ABNORMAL LOW (ref 70–99)
Glucose-Capillary: 89 mg/dL (ref 70–99)
Glucose-Capillary: 91 mg/dL (ref 70–99)
Glucose-Capillary: 96 mg/dL (ref 70–99)

## 2022-05-28 MED ORDER — DEXTROSE 50 % IV SOLN
12.5000 g | INTRAVENOUS | Status: AC
  Administered 2022-05-28: 12.5 g via INTRAVENOUS
  Filled 2022-05-28: qty 50

## 2022-05-28 NOTE — Progress Notes (Signed)
Pt wanted to take cpap off due to coughing. Pt placed on 3L [place].

## 2022-05-28 NOTE — Progress Notes (Signed)
[facility] Progress Note   Subjective:   Patient seen and examined at bedside during dialysis.  Tolerating treatment well so far.  Reports SOB overnight with intermittent CP.  "I just dont feel well."  Denies abdominal pain and n/v/d.    Objective Vitals:   05/28/22 1015 05/28/22 1030 05/28/22 1045 05/28/22 1051  BP: (!) 92/35 90/72 (!) 84/64 96/68  Pulse: (!) 39 86 (!) 101   Resp: (!) 21 16 18    Temp:      TempSrc:      SpO2: 100% 100% 97%   Weight:      Height:       Physical Exam General:chronically ill appearing male in NAD Heart:RRR, no mrg Lungs:+rhonchi anteriorly, nml WOB on 3L O2 via [place] Abdomen:soft, NTND Extremities:trace edema in hips, R AKA, L BKA Dialysis Access: LU AVF in use   Filed Weights   05/26/22 1825 05/27/22 0500 05/28/22 0525  Weight: 73.2 kg 76 kg 75.4 kg    Intake/Output Summary (Last 24 hours) at 05/28/2022 1055 Last data filed at 05/27/2022 1800 Gross per 24 hour  Intake 350 ml  Output --  Net 350 ml    Additional Objective Labs: Basic Metabolic Panel: Recent Labs  Lab 05/22/22 1423 05/22/22 2144 05/26/22 0120 05/27/22 0006 05/28/22 0148  NA 136   < > 131* 134* 130*  K 3.2*   < > 3.9 3.7 4.2  CL 99   < > 95* 97* 95*  CO2 30   < > 24 29 26   GLUCOSE 98   < > 216* 113* 88  BUN 12   < > 18 10 14   CREATININE 2.14*   < > 3.47* 2.48* 3.27*  CALCIUM 7.4*   < > 7.9* 7.8* 8.3*  PHOS 2.8  --   --   --   --    < > = values in this interval not displayed.   Liver Function Tests: Recent Labs  Lab 05/23/22 1139  PROT 4.9*   CBC: Recent Labs  Lab 05/22/22 0218 05/22/22 1423 05/24/22 0036 05/25/22 1140 05/26/22 0120 05/27/22 0006 05/28/22 0148  WBC 6.3   < > 8.6 7.2 8.0 9.2 9.3  NEUTROABS 4.3  --   --   --   --   --   --   HGB 7.9*   < > 8.6* 9.2* 9.5* 9.1* 9.6*  HCT 23.8*   < > 26.3* 27.8* 29.9* 27.9* 29.6*  MCV 93.3   < > 94.6 94.2 96.5 95.2 96.7  PLT 142*   < > 133* 143* 183 162 159   < > = values in this  interval not displayed.   Blood Culture    Component Value Date/Time   SDES PLEURAL 05/23/2022 1104   SPECREQUEST FLUID 05/23/2022 1104   CULT  05/23/2022 1104    NO GROWTH 3 DAYS Performed at [facility], [place] [address], [place], [place] [address]    REPTSTATUS 05/27/2022 FINAL 05/23/2022 1104    CBG: Recent Labs  Lab 05/27/22 2055 05/27/22 2358 05/28/22 0350 05/28/22 0424 05/28/22 0849  GLUCAP 174* 100* 67* 144* 96    Studies/Results: DG Swallowing Func-Speech Pathology  Result Date: 05/27/2022 Table formatting from the original result was not included. Objective Swallowing Evaluation: Type of Study: MBS-Modified Barium Swallow Study  Patient Details Name: Terry Graves. MRN: [id number] Date of Birth: [birth year] Today's Date: 05/27/2022 Time: SLP Start Time (ACUTE ONLY): 1410 -SLP Stop Time (ACUTE ONLY): 1425  SLP Time Calculation (min) (ACUTE ONLY): 15 min Past Medical History: Past Medical History: Diagnosis Date  Anemia   Arthritis   Atrial fibrillation (HCC)   CHF (congestive heart failure) (HCC)   Chronic kidney disease-mineral and bone disorder   ESRD on hemodialysis ([place])   takes HD in [address] on MWF schedule  Headache   HOH (hard of hearing)   Hyperlipidemia   Hypertension   Hypothyroidism   Peripheral vascular disease (HCC)   Proteinuria   Sleep apnea   wears CPAP 12  Type 2 diabetes mellitus with stage 5 chronic kidney disease ([place])   Wears glasses  Past Surgical History: Past Surgical History: Procedure Laterality Date  A/V FISTULAGRAM N/A 03/14/2020  Procedure: A/V FISTULAGRAM - left arm;  Surgeon: Elam Dutch, MD;  Location: [facility];  Service: Cardiovascular;  Laterality: N/A;  ABDOMINAL AORTOGRAM W/LOWER EXTREMITY Bilateral 05/23/2020  Procedure: ABDOMINAL AORTOGRAM W/LOWER EXTREMITY;  Surgeon: Elam Dutch, MD;  Location: [facility];  Service: Cardiovascular;  Laterality: Bilateral;  Bilateral   AV FISTULA PLACEMENT Left 01/01/2020   Procedure: LEFT ARM ARTERIOVENOUS (AV) FISTULA CREATION;  Surgeon: Angelia Mould, MD;  Location: [facility] OR;  Service: Vascular;  Laterality: Left;  CATARACT EXTRACTION W/ INTRAOCULAR LENS  IMPLANT, BILATERAL    COLONOSCOPY    COLONOSCOPY N/A 04/09/2020  Procedure: COLONOSCOPY;  Surgeon: [name], MD;  [facility];  Service: Endoscopy;  Laterality: N/A;  COLONOSCOPY Left 05/22/2022  Procedure: COLONOSCOPY;  Surgeon: Lavena Bullion, DO;  Location: [place];  Service: Gastroenterology;  Laterality: Left;  ESOPHAGOGASTRODUODENOSCOPY N/A 04/25/2019  Procedure: ESOPHAGOGASTRODUODENOSCOPY (EGD);  Surgeon: [name], MD;  [facility];  Service: Endoscopy;  Laterality: N/A;  ESOPHAGOGASTRODUODENOSCOPY N/A 04/08/2020  Procedure: ESOPHAGOGASTRODUODENOSCOPY (EGD);  Surgeon: [name], MD;  [facility];  Service: Endoscopy;  Laterality: N/A;  GIVENS CAPSULE STUDY N/A 04/16/2020  Procedure: GIVENS CAPSULE STUDY;  Surgeon: [name], MD;  [facility];  Service: Endoscopy;  Laterality: N/A;  [place]  05/22/2022  Procedure: HEMOSTASIS CLIP PLACEMENT;  Surgeon: Lavena Bullion, DO;  Location: [facility];  Service: Gastroenterology;;  HIP ARTHROSCOPY Left   INSERTION OF DIALYSIS CATHETER Right 01/01/2020  Procedure: INSERTION OF RIGHT INTERNAL JUGULAR DIALYSIS CATHETER;  Surgeon: Angelia Mould, MD;  Location: [place];  Service: Vascular;  Laterality: Right;  LIGATION OF COMPETING BRANCHES OF ARTERIOVENOUS FISTULA Left 03/28/2020  Procedure: SIDE BRANCH LIGATION TIMES THREE OF LEFT ARM  ARTERIOVENOUS FISTULA;  Surgeon: Marty Heck, MD;  Location: [place];  Service: Vascular;  Laterality: Left;  POLYPECTOMY  05/22/2022  Procedure: POLYPECTOMY;  Surgeon: Lavena Bullion, DO;  Location: [facility];  Service: Gastroenterology;;  Clide Deutscher  05/22/2022  Procedure: Clide Deutscher;  Surgeon: Lavena Bullion, DO;   Location: [facility];  Service: Gastroenterology;; HPI: Patient is an 87 y.o. male with PMH: ESRD on HD, h/o lung masses, GERD, OSA on CPAP, ,recent GI bleed who presented to [facility] ED on 8/10 with GI bleed. He was mildly hypotensive but otherwise hemodynamically stable, afebrile on RA, initial Hgb 5.9 and given 2 units PRBCs. RN who had patient this morning reported he had been having some coughing while eating meals.  Subjective: pleasant, stating that his back hurts  Recommendations for follow up therapy are one component of a multi-disciplinary discharge planning process, led by the attending physician.  Recommendations may be updated based on patient status, additional  functional criteria and insurance authorization. Assessment / Plan / Recommendation   05/27/2022   3:13 PM Clinical Impressions Clinical Impression Patient presents with a mild oral phase dysphagia and a pharyngeal phase of swallow that appears [facility]. He exhibited brief delays with anterior to posterior transit with solids but no oral residuals and good bolus cohesion. Swallow initiation was timely for all tested consistencies (thin and nectar thick liquids, puree solids, 74mm barium tablet and regular solids). No penetration or aspiration observed with liquids or solids. When swallowing barium tablet with thin liquid barium, patient had difficulty keeping a cohesive bolus and benefited from sips of nectar thick liquids to help transit. Tablet became briefly lodged at level of UES but otherwise transited without difficulty. Esophageal sweep revealed retrograde movement of thin liquid barium in distal esophagus as well as barium tablet briefly stuck in distal portion of esophagus. He had frequent belching during the MBS and reported to SLP that he has been coughing up a log of phlegm today. SLP is recommending continue on Dys 3 solids, thin liquids. SLP will plan to f/u at least one more time to ensure toleration but suspect that his symptoms are  occuring at the esophageal phase.     05/27/2022   3:11 PM Treatment Recommendations Treatment Recommendations Therapy as outlined in treatment plan below     05/27/2022   3:18 PM Prognosis Prognosis for Safe Diet Advancement Good Barriers to Reach Goals Time post onset   05/27/2022   3:11 PM Diet Recommendations SLP Diet Recommendations Regular solids;Dysphagia 3 (Mech soft) solids;Thin liquid Liquid Administration via Cup;Straw Medication Administration Whole meds with puree Compensations Slow rate;Small sips/bites Postural Changes Seated upright at 90 degrees;Remain semi-upright after after feeds/meals (Comment)     05/27/2022   3:11 PM Other Recommendations Oral Care Recommendations Oral care BID Follow Up Recommendations No SLP follow up Assistance recommended at discharge Intermittent Supervision/Assistance Functional Status Assessment Patient has had a recent decline in their functional status and demonstrates the ability to make significant improvements in function in a reasonable and predictable amount of time.   05/27/2022   3:11 PM Frequency and Duration  Speech Therapy Frequency (ACUTE ONLY) min 1 x/week Treatment Duration 1 week     05/27/2022   3:10 PM Oral Phase Oral Phase Impaired Oral - Nectar Cup WFL Oral - Thin Cup WFL Oral - Thin Straw WFL Oral - Puree Reduced posterior propulsion Oral - Mech Soft Reduced posterior propulsion Oral - Pill Decreased bolus cohesion;Reduced posterior propulsion    05/27/2022   3:10 PM Pharyngeal Phase Pharyngeal Phase Impaired Pharyngeal- Nectar Cup [facility] Pharyngeal- Thin Cup WFL Pharyngeal- Thin Straw WFL Pharyngeal- Puree WFL Pharyngeal- Mechanical Soft WFL Pharyngeal- Pill Reduced pharyngeal peristalsis;Pharyngeal residue - cp segment    05/27/2022   3:11 PM Cervical Esophageal Phase  Cervical Esophageal Phase [facility] Sonia Baller, MA, CCC-SLP Speech Therapy                      Medications:  norepinephrine (LEVOPHED) Adult infusion Stopped (05/26/22 0722)     amoxicillin-clavulanate  500 mg Oral Q12H   apixaban  2.5 mg Oral BID   atorvastatin  40 mg Oral QHS   Chlorhexidine Gluconate Cloth  6 each Topical Daily   Chlorhexidine Gluconate Cloth  6 each Topical Q0600   cyanocobalamin  1,000 mcg Intramuscular Q7 days   insulin aspart  1-3 Units Subcutaneous Q4H   levothyroxine  25 mcg Oral QAC breakfast   midodrine  20 mg Oral Q8H   pantoprazole  40 mg Oral Daily   rOPINIRole  0.5 mg Oral QHS   sodium chloride flush  10-40 mL Intracatheter Q12H   sodium chloride flush  3 mL Intravenous Q12H    Dialysis Orders: MWF [facility] (> per Care Everywhere)  3h 42min  400/2.0  73kg  2/2.5 bath  LUA AVG   16ga  Hep ? 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 S/p PRBCs. Hgb stable in 9s. Shock - due to hemorrhage as above, not on pressors at this time Aspiration Pna: s/p vanc/cefepime, transitioned to augmentin.  Per PCCM Pleural effusions/ lung mass - by CT. Is being f/b doctor in [place] for lung mass. Per CCM. S/p thora 8/13-850cc drained. Location not amendable to chest tube placement by pulm or IR. Palliative consulted. Per PMD. ESRD - on HD MWF in [place]. Will c/w MWF schedule. Anemia esrd - Hb 5.9 on admission.. Fe replete on panel. . Hgb 9.6--stopped ESA given lung mass and concerning PET scan MBD ckd - CCa in range, phos was low.  No binders needed. Cont to monitor, replete if needed Atrial fib -restarted on hep gtt HTN/volume - on midodrine. UF as tolerated.   Jen Mow, PA-C [facility] 05/28/2022,10:55 AM  LOS: 8 days

## 2022-05-28 NOTE — Progress Notes (Signed)
Received patient in bed to unit.  Alert and oriented.  Informed consent signed and in  chart.   Treatment initiated: 0823 Treatment completed: 1230  Patient tolerated well.  Transported back to the room  alert, without acute distress.  Hand-off given to patient's nurse.   Access used: AVf Access issues: None  Total UF removed: 2000 mls Medication(s) given: Midodrine Post HD VS: See chart Post HD weight: 74.3 kg   [facility]

## 2022-05-28 NOTE — Progress Notes (Signed)
OT Cancellation Note  Patient Details Name: Terry Graves. MRN: [id number] DOB: 08/29/1935   Cancelled Treatment:    Reason Eval/Treat Not Completed: Patient at procedure or test/ unavailable (HD in room at this time)  Jeri Modena 05/28/2022, 11:03 AM

## 2022-05-28 NOTE — Care Management Important Message (Signed)
Important Message  Patient Details  Name: Terry Graves. MRN: [id number] Date of Birth: Nov 10, 1934   Medicare Important Message Given:  Yes     Hannah Beat 05/28/2022, 11:26 AM

## 2022-05-28 NOTE — Progress Notes (Signed)
PT Cancellation Note  Patient Details Name: Terry Graves. MRN: [id number] DOB: 1935-09-17   Cancelled Treatment:    Reason Eval/Treat Not Completed: Patient at procedure or test/unavailable Patient receiving HD treatment. Will re-attempt as time and schedule allows.   Zeddie Njie A. Gilford Rile PT, DPT Acute Rehabilitation Services Office [phone]    Linna Hoff 05/28/2022, 12:40 PM

## 2022-05-28 NOTE — Plan of Care (Signed)
A&Ox4, transfer to unit this shift. Difficulty breathing overnight, congestion, coughing w/ thick secretions. PRN's given, oral suction & flutter valve effective.    Problem: Education: Goal: Knowledge of General Education information will improve Description: Including pain rating scale, medication(s)/side effects and non-pharmacologic comfort measures Outcome: Progressing   Problem: Health Behavior/Discharge Planning: Goal: Ability to manage health-related needs will improve Outcome: Progressing   Problem: Clinical Measurements: Goal: Ability to maintain clinical measurements within normal limits will improve Outcome: Progressing Goal: Will remain free from infection Outcome: Progressing Goal: Diagnostic test results will improve Outcome: Progressing Goal: Respiratory complications will improve Outcome: Progressing Goal: Cardiovascular complication will be avoided Outcome: Progressing   Problem: Activity: Goal: Risk for activity intolerance will decrease Outcome: Progressing   Problem: Nutrition: Goal: Adequate nutrition will be maintained Outcome: Progressing   Problem: Coping: Goal: Level of anxiety will decrease Outcome: Progressing   Problem: Elimination: Goal: Will not experience complications related to bowel motility Outcome: Progressing Goal: Will not experience complications related to urinary retention Outcome: Progressing   Problem: Pain Managment: Goal: General experience of comfort will improve Outcome: Progressing   Problem: Safety: Goal: Ability to remain free from injury will improve Outcome: Progressing   Problem: Skin Integrity: Goal: Risk for impaired skin integrity will decrease Outcome: Progressing   Problem: Education: Goal: Ability to describe self-care measures that may prevent or decrease complications (Diabetes Survival Skills Education) will improve Outcome: Progressing Goal: Individualized Educational Video(s) Outcome:  Progressing   Problem: Coping: Goal: Ability to adjust to condition or change in health will improve Outcome: Progressing   Problem: Fluid Volume: Goal: Ability to maintain a balanced intake and output will improve Outcome: Progressing   Problem: Health Behavior/Discharge Planning: Goal: Ability to identify and utilize available resources and services will improve Outcome: Progressing Goal: Ability to manage health-related needs will improve Outcome: Progressing   Problem: Metabolic: Goal: Ability to maintain appropriate glucose levels will improve Outcome: Progressing   Problem: Nutritional: Goal: Maintenance of adequate nutrition will improve Outcome: Progressing Goal: Progress toward achieving an optimal weight will improve Outcome: Progressing   Problem: Skin Integrity: Goal: Risk for impaired skin integrity will decrease Outcome: Progressing   Problem: Tissue Perfusion: Goal: Adequacy of tissue perfusion will improve Outcome: Progressing

## 2022-05-28 NOTE — Plan of Care (Signed)

## 2022-05-28 NOTE — Progress Notes (Signed)
PROGRESS NOTE    Terry Graves.  ZOX:[id number] DOB: 1935/10/02 DOA: 05/20/2022 PCP: [name], MD   Brief Narrative:  This 87 years old male with PMH significant for ESRD on dialysis Monday Wednesday and Fridays, hypothyroidism, GERD, HFpEF, chronic A-fib on anticoagulation(off Xarelto since 7/26) OSA on CPAP, recent GI bleed presented in the ED with a GI bleed. Patient recently admitted at [facility] noted to have colonic diverticulitis and small bowel AVM bleed.  Patient was given blood transfusion for low hemoglobin.  Patient presented to [facility] with a GI bleed.  Patient was mildly hypotensive,  initial Hb 5.6 creatinine close to baseline.  Patient was initially admitted in the ICU.  He underwent colonoscopy, bleeding is stopped.  He is found to have FDG avid lung masses on PET scan. PCCM pickup 05/28/2022.  Assessment & Plan:   Principal Problem:   GI bleed Active Problems:   Diverticulosis of colon with hemorrhage   AVM (arteriovenous malformation) of small bowel, acquired   Acute on chronic anemia   Anemia due to vitamin B12 deficiency   Grade II internal hemorrhoids   Dieulafoy lesion of colon   Hematochezia   Cecal polyp   Sepsis secondary to aspiration pneumonia: Loculated parapneumonic effusion Antibiotics changed to Augmentin. Needs 4 to 6 weeks of p.o. antibiotics since pleural space not amenable to drainage. Continue supplemental oxygen to maintain SPO2 above 90% Continue bronchodilators as needed Continue midodrine for hypotension. wean as able.  Acute hypoxic respiratory failure secondary to pneumonia: Continue current antibiotics.  Continue supplemental oxygen. Pulmonary hygiene, wean as able to.  End-stage renal disease on hemodialysis: Continue Hemodialysis as per nephrology.  Anemia of chronic disease: H&H remains stable.  Transfuse for hemoglobin less than 7.  Dysphagia: Obtain barium esophagogram. Patient swallow evaluation.   FDG avid  lung mass: Lung biopsy not indicated; family has elected they would not want this Palliative care consulted.  Awaiting recommendation   Hypothyroidism: Continue levothyroxine 25 mcg daily   Deconditioning PT and OT eval.   Restless leg syndrome Continue Requip   Goals of care discussion: Palliative care is meeting with family.     DVT prophylaxis: Eliquis Code Status: DNR Family Communication: No family at bedside Disposition Plan:   Status is: Inpatient Remains inpatient appropriate because: Admitted for GI bleeding which has been resolved.  Now patient has developed parapneumonic effusion lung mass family does not want aggressive intervention.  Palliative care is consulted.   Consultants:  PCCM Nephrology ID  Procedures: Colonoscopy   Antimicrobials:  Anti-infectives (From admission, onward)    Start     Dose/Rate Route Frequency Ordered Stop   05/27/22 1900  amoxicillin-clavulanate (AUGMENTIN) 500-125 MG per tablet 500 mg        500 mg Oral Every 12 hours 05/27/22 1045     05/26/22 1200  ceFEPIme (MAXIPIME) 2 g in sodium chloride 0.9 % 100 mL IVPB  Status:  Discontinued        2 g 200 mL/hr over 30 Minutes Intravenous Every M-W-F (Hemodialysis) 05/25/22 1835 05/27/22 1045   05/26/22 1200  vancomycin (VANCOREADY) IVPB 750 mg/150 mL  Status:  Discontinued        750 mg 150 mL/hr over 60 Minutes Intravenous Every M-W-F (Hemodialysis) 05/25/22 1835 05/26/22 1457   05/25/22 1930  ceFEPIme (MAXIPIME) 2 g in sodium chloride 0.9 % 100 mL IVPB        2 g 200 mL/hr over 30 Minutes Intravenous  Once 05/25/22 1835 05/25/22  2244   05/25/22 1930  vancomycin (VANCOREADY) IVPB 1500 mg/300 mL        1,500 mg 150 mL/hr over 120 Minutes Intravenous  Once 05/25/22 1835 05/25/22 2213   05/25/22 1915  metroNIDAZOLE (FLAGYL) IVPB 500 mg  Status:  Discontinued        500 mg 100 mL/hr over 60 Minutes Intravenous Every 12 hours 05/25/22 1829 05/27/22 1045      Subjective: Patient  was seen and examined at bedside.  Overnight events noted.   Patient was getting hemodialysis, reports he is doing better, denies any pain.  Objective: Vitals:   05/28/22 1230 05/28/22 1255 05/28/22 1300 05/28/22 1400  BP: (!) 92/44  99/68 105/61  Pulse: (!) 54  87 74  Resp: 15  20 19   Temp: 97.6 F (36.4 C)   97.6 F (36.4 C)  TempSrc: Oral   Axillary  SpO2: 100%  100% 91%  Weight:  74.3 kg    Height:        Intake/Output Summary (Last 24 hours) at 05/28/2022 1500 Last data filed at 05/28/2022 1245 Gross per 24 hour  Intake 150 ml  Output 2000 ml  Net -1850 ml   Filed Weights   05/27/22 0500 05/28/22 0525 05/28/22 1255  Weight: 76 kg 75.4 kg 74.3 kg    Examination:  General exam: Appears comfortable, not in any acute distress.  Deconditioned Respiratory system: CTA bilaterally, no wheezing, no crackles, normal respiratory effort Cardiovascular system: S1 & S2 heard, regular rate and rhythm, no murmur.  Gastrointestinal system: Abdomen is soft, non tender, non distended, BS+. Extremities : No edema, no cyanosis, no clubbing Skin: No rashes, lesions or ulcers Psychiatry: Judgement and insight appear normal. Mood & affect appropriate.     Data Reviewed: I have personally reviewed following labs and imaging studies  CBC: Recent Labs  Lab 05/22/22 0218 05/22/22 1423 05/24/22 0036 05/25/22 1140 05/26/22 0120 05/27/22 0006 05/28/22 0148  WBC 6.3   < > 8.6 7.2 8.0 9.2 9.3  NEUTROABS 4.3  --   --   --   --   --   --   HGB 7.9*   < > 8.6* 9.2* 9.5* 9.1* 9.6*  HCT 23.8*   < > 26.3* 27.8* 29.9* 27.9* 29.6*  MCV 93.3   < > 94.6 94.2 96.5 95.2 96.7  PLT 142*   < > 133* 143* 183 162 159   < > = values in this interval not displayed.   Basic Metabolic Panel: Recent Labs  Lab 05/22/22 1423 05/22/22 2144 05/23/22 0008 05/24/22 0036 05/25/22 0059 05/26/22 0120 05/27/22 0006 05/28/22 0148  NA 136  --    < > 130* 133* 131* 134* 130*  K 3.2* 3.3*   < > 3.4* 3.3* 3.9  3.7 4.2  CL 99  --    < > 95* 96* 95* 97* 95*  CO2 30  --    < > 28 29 24 29 26   GLUCOSE 98  --    < > 132* 115* 216* 113* 88  BUN 12  --    < > 19 10 18 10 14   CREATININE 2.14*  --    < > 3.50* 2.46* 3.47* 2.48* 3.27*  CALCIUM 7.4*  --    < > 7.3* 7.6* 7.9* 7.8* 8.3*  MG  --  1.8  --   --  1.5*  --  1.7  --   PHOS 2.8  --   --   --   --   --   --   --    < > =  values in this interval not displayed.   GFR: Estimated Creatinine Clearance: 16.5 mL/min (A) (by C-G formula based on SCr of 3.27 mg/dL (H)). Liver Function Tests: Recent Labs  Lab 05/23/22 1139  PROT 4.9*   No results for input(s): "LIPASE", "AMYLASE" in the last 168 hours. No results for input(s): "AMMONIA" in the last 168 hours. Coagulation Profile: Recent Labs  Lab 05/22/22 0212  INR 1.3*   Cardiac Enzymes: No results for input(s): "CKTOTAL", "CKMB", "CKMBINDEX", "TROPONINI" in the last 168 hours. BNP (last 3 results) No results for input(s): "PROBNP" in the last 8760 hours. HbA1C: No results for input(s): "HGBA1C" in the last 72 hours. CBG: Recent Labs  Lab 05/27/22 2358 05/28/22 0350 05/28/22 0424 05/28/22 0849 05/28/22 1210  GLUCAP 100* 67* 144* 96 91   Lipid Profile: No results for input(s): "CHOL", "HDL", "LDLCALC", "TRIG", "CHOLHDL", "LDLDIRECT" in the last 72 hours. Thyroid Function Tests: No results for input(s): "TSH", "T4TOTAL", "FREET4", "T3FREE", "THYROIDAB" in the last 72 hours. Anemia Panel: No results for input(s): "VITAMINB12", "FOLATE", "FERRITIN", "TIBC", "IRON", "RETICCTPCT" in the last 72 hours. Sepsis Labs: Recent Labs  Lab 05/24/22 0949 05/24/22 1142  LATICACIDVEN 1.0 1.1    Recent Results (from the past 240 hour(s))  MRSA Next Gen by PCR, Nasal     Status: None   Collection Time: 05/20/22  7:19 PM   Specimen: Nasal Mucosa; Nasal Swab  Result Value Ref Range Status   MRSA by PCR Next Gen NOT DETECTED NOT DETECTED Final    Comment: (NOTE) The GeneXpert MRSA Assay (FDA  approved for NASAL specimens only), is one component of a comprehensive MRSA colonization surveillance program. It is not intended to diagnose MRSA infection nor to guide or monitor treatment for MRSA infections. Test performance is not FDA approved in patients less than 32 years old. Performed at [facility], [place] [address] [place], [place] [address]   Body fluid culture w Gram Stain     Status: None   Collection Time: 05/23/22 11:04 AM   Specimen: Pleural Fluid  Result Value Ref Range Status   Specimen Description PLEURAL  Final   Special Requests FLUID  Final   Gram Stain   Final    FEW WBC PRESENT, PREDOMINANTLY MONONUCLEAR NO ORGANISMS SEEN    Culture   Final    NO GROWTH 3 DAYS Performed at [facility], [place] [address] [place], [place] [address]    Report Status 05/27/2022 FINAL  Final  MRSA Next Gen by PCR, Nasal     Status: None   Collection Time: 05/26/22  8:07 AM   Specimen: Nasal Mucosa; Nasal Swab  Result Value Ref Range Status   MRSA by PCR Next Gen NOT DETECTED NOT DETECTED Final    Comment: (NOTE) The GeneXpert MRSA Assay (FDA approved for NASAL specimens only), is one component of a comprehensive MRSA colonization surveillance program. It is not intended to diagnose MRSA infection nor to guide or monitor treatment for MRSA infections. Test performance is not FDA approved in patients less than 85 years old. Performed at [facility], [place] [address], [place], [place] [address]     Radiology Studies: DG Swallowing Func-Speech Pathology  Result Date: 05/27/2022 Table formatting from the original result was not included. Objective Swallowing Evaluation: Type of Study: MBS-Modified Barium Swallow Study  Patient Details Name: Terry Graves. MRN: [id number] Date of Birth: 01/06/1935 Today's Date: 05/27/2022 Time: SLP Start Time (ACUTE ONLY): 1410 -SLP Stop  Time (ACUTE ONLY): 1425 SLP Time Calculation (min) (ACUTE ONLY): 15 min Past Medical  History: Past Medical History: Diagnosis Date  Anemia   Arthritis   Atrial fibrillation (HCC)   CHF (congestive heart failure) (HCC)   Chronic kidney disease-mineral and bone disorder   ESRD on hemodialysis ([place])   takes HD in [place] on MWF schedule  Headache   HOH (hard of hearing)   Hyperlipidemia   Hypertension   Hypothyroidism   Peripheral vascular disease (HCC)   Proteinuria   Sleep apnea   wears CPAP 12  Type 2 diabetes mellitus with stage 5 chronic kidney disease ([place])   Wears glasses  Past Surgical History: Past Surgical History: Procedure Laterality Date  A/V FISTULAGRAM N/A 03/14/2020  Procedure: A/V FISTULAGRAM - left arm;  Surgeon: Elam Dutch, MD;  Location: [place] [facility];  Service: Cardiovascular;  Laterality: N/A;  ABDOMINAL AORTOGRAM W/LOWER EXTREMITY Bilateral 05/23/2020  Procedure: ABDOMINAL AORTOGRAM W/LOWER EXTREMITY;  Surgeon: Elam Dutch, MD;  Location: [place] [facility];  Service: Cardiovascular;  Laterality: Bilateral;  Bilateral   AV FISTULA PLACEMENT Left 01/01/2020  Procedure: LEFT ARM ARTERIOVENOUS (AV) FISTULA CREATION;  Surgeon: Angelia Mould, MD;  Location: [facility] OR;  Service: Vascular;  Laterality: Left;  CATARACT EXTRACTION W/ INTRAOCULAR LENS  IMPLANT, BILATERAL    COLONOSCOPY    COLONOSCOPY N/A 04/09/2020  Procedure: COLONOSCOPY;  Surgeon: [name], MD;  [facility];  Service: Endoscopy;  Laterality: N/A;  COLONOSCOPY Left 05/22/2022  Procedure: COLONOSCOPY;  Surgeon: Lavena Bullion, DO;  Location: [place];  Service: Gastroenterology;  Laterality: Left;  ESOPHAGOGASTRODUODENOSCOPY N/A 04/25/2019  Procedure: ESOPHAGOGASTRODUODENOSCOPY (EGD);  Surgeon: [name], MD;  [facility];  Service: Endoscopy;  Laterality: N/A;  ESOPHAGOGASTRODUODENOSCOPY N/A 04/08/2020  Procedure: ESOPHAGOGASTRODUODENOSCOPY (EGD);  Surgeon: [name], MD;  [facility];  Service: Endoscopy;  Laterality: N/A;  GIVENS CAPSULE  STUDY N/A 04/16/2020  Procedure: GIVENS CAPSULE STUDY;  Surgeon: [name], MD;  [facility];  Service: Endoscopy;  Laterality: N/A;  [place]  05/22/2022  Procedure: HEMOSTASIS CLIP PLACEMENT;  Surgeon: Lavena Bullion, DO;  Location: [place] [facility];  Service: Gastroenterology;;  HIP ARTHROSCOPY Left   INSERTION OF DIALYSIS CATHETER Right 01/01/2020  Procedure: INSERTION OF RIGHT INTERNAL JUGULAR DIALYSIS CATHETER;  Surgeon: Angelia Mould, MD;  Location: [place];  Service: Vascular;  Laterality: Right;  LIGATION OF COMPETING BRANCHES OF ARTERIOVENOUS FISTULA Left 03/28/2020  Procedure: SIDE BRANCH LIGATION TIMES THREE OF LEFT ARM  ARTERIOVENOUS FISTULA;  Surgeon: Marty Heck, MD;  Location: [place];  Service: Vascular;  Laterality: Left;  POLYPECTOMY  05/22/2022  Procedure: POLYPECTOMY;  Surgeon: Lavena Bullion, DO;  Location: [place];  Service: Gastroenterology;;  Clide Deutscher  05/22/2022  Procedure: Clide Deutscher;  Surgeon: Lavena Bullion, DO;  [facility];  Service: Gastroenterology;; HPI: Patient is an 87 y.o. male with PMH: ESRD on HD, h/o lung masses, GERD, OSA on CPAP, ,recent GI bleed who presented to [facility] ED on 8/10 with GI bleed. He was mildly hypotensive but otherwise hemodynamically stable, afebrile on RA, initial Hgb 5.9 and given 2 units PRBCs. RN who had patient this morning reported he had been having some coughing while eating meals.  Subjective: pleasant, stating that his back hurts  Recommendations for follow up therapy are one component of a multi-disciplinary discharge planning process, led by the attending physician.  Recommendations may be updated based  on patient status, additional functional criteria and insurance authorization. Assessment / Plan / Recommendation   05/27/2022   3:13 PM Clinical Impressions Clinical Impression Patient presents with a mild oral phase dysphagia and a pharyngeal phase of swallow that  appears [facility]. He exhibited brief delays with anterior to posterior transit with solids but no oral residuals and good bolus cohesion. Swallow initiation was timely for all tested consistencies (thin and nectar thick liquids, puree solids, 1mm barium tablet and regular solids). No penetration or aspiration observed with liquids or solids. When swallowing barium tablet with thin liquid barium, patient had difficulty keeping a cohesive bolus and benefited from sips of nectar thick liquids to help transit. Tablet became briefly lodged at level of UES but otherwise transited without difficulty. Esophageal sweep revealed retrograde movement of thin liquid barium in distal esophagus as well as barium tablet briefly stuck in distal portion of esophagus. He had frequent belching during the MBS and reported to SLP that he has been coughing up a log of phlegm today. SLP is recommending continue on Dys 3 solids, thin liquids. SLP will plan to f/u at least one more time to ensure toleration but suspect that his symptoms are occuring at the esophageal phase.     05/27/2022   3:11 PM Treatment Recommendations Treatment Recommendations Therapy as outlined in treatment plan below     05/27/2022   3:18 PM Prognosis Prognosis for Safe Diet Advancement Good Barriers to Reach Goals Time post onset   05/27/2022   3:11 PM Diet Recommendations SLP Diet Recommendations Regular solids;Dysphagia 3 (Mech soft) solids;Thin liquid Liquid Administration via Cup;Straw Medication Administration Whole meds with puree Compensations Slow rate;Small sips/bites Postural Changes Seated upright at 90 degrees;Remain semi-upright after after feeds/meals (Comment)     05/27/2022   3:11 PM Other Recommendations Oral Care Recommendations Oral care BID Follow Up Recommendations No SLP follow up Assistance recommended at discharge Intermittent Supervision/Assistance Functional Status Assessment Patient has had a recent decline in their functional status and  demonstrates the ability to make significant improvements in function in a reasonable and predictable amount of time.   05/27/2022   3:11 PM Frequency and Duration  Speech Therapy Frequency (ACUTE ONLY) min 1 x/week Treatment Duration 1 week     05/27/2022   3:10 PM Oral Phase Oral Phase Impaired Oral - Nectar Cup WFL Oral - Thin Cup WFL Oral - Thin Straw WFL Oral - Puree Reduced posterior propulsion Oral - Mech Soft Reduced posterior propulsion Oral - Pill Decreased bolus cohesion;Reduced posterior propulsion    05/27/2022   3:10 PM Pharyngeal Phase Pharyngeal Phase Impaired Pharyngeal- Nectar Cup [facility] Pharyngeal- Thin Cup WFL Pharyngeal- Thin Straw WFL Pharyngeal- Puree WFL Pharyngeal- Mechanical Soft WFL Pharyngeal- Pill Reduced pharyngeal peristalsis;Pharyngeal residue - cp segment    05/27/2022   3:11 PM Cervical Esophageal Phase  Cervical Esophageal Phase [facility] Sonia Baller, MA, CCC-SLP Speech Therapy                       Scheduled Meds:  amoxicillin-clavulanate  500 mg Oral Q12H   apixaban  2.5 mg Oral BID   atorvastatin  40 mg Oral QHS   Chlorhexidine Gluconate Cloth  6 each Topical Daily   Chlorhexidine Gluconate Cloth  6 each Topical Q0600   cyanocobalamin  1,000 mcg Intramuscular Q7 days   insulin aspart  1-3 Units Subcutaneous Q4H   levothyroxine  25 mcg Oral QAC breakfast   midodrine  20 mg Oral Q8H  pantoprazole  40 mg Oral Daily   rOPINIRole  0.5 mg Oral QHS   sodium chloride flush  10-40 mL Intracatheter Q12H   sodium chloride flush  3 mL Intravenous Q12H   Continuous Infusions:   LOS: 8 days    Time spent: 50 mins    Terry Aull, MD [facility]   If 7PM-7AM, please contact night-coverage

## 2022-05-29 DIAGNOSIS — K922 Gastrointestinal hemorrhage, unspecified: Secondary | ICD-10-CM | POA: Diagnosis not present

## 2022-05-29 LAB — CBC
HCT: 30.2 % — ABNORMAL LOW (ref 39.0–52.0)
Hemoglobin: 10 g/dL — ABNORMAL LOW (ref 13.0–17.0)
MCH: 32.2 pg (ref 26.0–34.0)
MCHC: 33.1 g/dL (ref 30.0–36.0)
MCV: 97.1 fL (ref 80.0–100.0)
Platelets: 155 10*3/uL (ref 150–400)
RBC: 3.11 MIL/uL — ABNORMAL LOW (ref 4.22–5.81)
RDW: 17.5 % — ABNORMAL HIGH (ref 11.5–15.5)
WBC: 8.4 10*3/uL (ref 4.0–10.5)
nRBC: 0 % (ref 0.0–0.2)

## 2022-05-29 LAB — GLUCOSE, CAPILLARY
Glucose-Capillary: 110 mg/dL — ABNORMAL HIGH (ref 70–99)
Glucose-Capillary: 142 mg/dL — ABNORMAL HIGH (ref 70–99)
Glucose-Capillary: 166 mg/dL — ABNORMAL HIGH (ref 70–99)
Glucose-Capillary: 127 mg/dL — ABNORMAL HIGH (ref 70–99)

## 2022-05-29 MED ORDER — ALBUMIN HUMAN 25 % IV SOLN
INTRAVENOUS | Status: AC
  Administered 2022-05-29: 12.5 g
  Filled 2022-05-29: qty 50

## 2022-05-29 MED ORDER — CHLORHEXIDINE GLUCONATE CLOTH 2 % EX PADS
6.0000 | MEDICATED_PAD | Freq: Every day | CUTANEOUS | Status: DC
  Administered 2022-05-29 – 2022-06-01 (×3): 6 via TOPICAL

## 2022-05-29 NOTE — Evaluation (Signed)
Physical Therapy Evaluation Patient Details Name: Terry Graves. MRN: [id number] DOB: 1935-10-02 Today's Date: 05/29/2022  History of Present Illness  Pt is a 87 y.o. M who presents with  GIB, sepsis secondary to aspiration PNA 05/20/2022. Significant PMH: ESRD on HD MWF, HFpEF, chronic A-fib, recent GIB, L BKA, R AKA.  Clinical Impression  PTA, pt lives with his daughter, performs slide board transfers to electric scooter, and has transportation to HD. Pt presents with decreased cardiopulmonary endurance, generalized weakness, and impaired sitting balance. Pt requiring min assist for bed mobility. Performed simulated transfers edge of bed with increased difficulty. SpO2 98% on 4L via CPAP, BP 162/62 (93), HR 82-117 afib. Would benefit from ST SNF to address deficits and maximize functional mobility.     Recommendations for follow up therapy are one component of a multi-disciplinary discharge planning process, led by the attending physician.  Recommendations may be updated based on patient status, additional functional criteria and insurance authorization.  Follow Up Recommendations Skilled nursing-short term rehab (<3 hours/day) Can patient physically be transported by private vehicle: No    Assistance Recommended at Discharge Frequent or constant Supervision/Assistance  Patient can return home with the following  A lot of help with walking and/or transfers;A little help with bathing/dressing/bathroom    Equipment Recommendations None recommended by PT  Recommendations for Other Services       Functional Status Assessment Patient has had a recent decline in their functional status and demonstrates the ability to make significant improvements in function in a reasonable and predictable amount of time.     Precautions / Restrictions Precautions Precautions: Fall;Other (comment) Precaution Comments: L BKA, R AKA Restrictions Weight Bearing Restrictions: No      Mobility  Bed  Mobility Overal bed mobility: Needs Assistance Bed Mobility: Supine to Sit, Sit to Supine     Supine to sit: Min assist Sit to supine: Min assist   General bed mobility comments: Assist with bed pad to guide hips. Simulated lateral scoot and AP transfers, pt with increased difficulty and DOE    Transfers                   General transfer comment: deferred    Ambulation/Gait                  Stairs            Wheelchair Mobility    Modified Rankin (Stroke Patients Only)       Balance Overall balance assessment: Needs assistance Sitting-balance support: No upper extremity supported Sitting balance-Leahy Scale: Fair                                       Pertinent Vitals/Pain Pain Assessment Pain Assessment: Faces Faces Pain Scale: Hurts little more Pain Location: back (chronic) Pain Descriptors / Indicators: Aching, Grimacing Pain Intervention(s): Monitored during session    Home Living Family/patient expects to be discharged to:: Private residence Living Arrangements: Children (daughter)   Type of Home: House Home Access: Ramped entrance       Home Layout: One level Home Equipment: Tub bench;Electric scooter;Other (comment);Transport chair ([employer])      Prior Function Prior Level of Function : Needs assist             Mobility Comments: slide board transfers to electric scooter with intermittent assist ADLs Comments: pt reports independent ADL's, medical  transportation to HD     Hand Dominance        Extremity/Trunk Assessment   Upper Extremity Assessment Upper Extremity Assessment: Defer to OT evaluation    Lower Extremity Assessment Lower Extremity Assessment: Generalized weakness;RLE deficits/detail;LLE deficits/detail RLE Deficits / Details: prior AKA LLE Deficits / Details: prior BKA, tendency to rest in ER, able to reach neutral extension       Communication   Communication: No  difficulties  Cognition Arousal/Alertness: Awake/alert Behavior During Therapy: WFL for tasks assessed/performed Overall Cognitive Status: No family/caregiver present to determine baseline cognitive functioning                                 General Comments: Pt unclear historian; able to follow all 1 step commands        General Comments      Exercises Amputee Exercises Hip Flexion/Marching: Left, 10 reps, Seated Knee Extension: Left, 10 reps, Seated Other Exercises Other Exercises: Sitting: functional reaching with BUE's   Assessment/Plan    PT Assessment Patient needs continued PT services  PT Problem List Decreased strength;Decreased activity tolerance;Decreased balance;Decreased mobility       PT Treatment Interventions DME instruction;Functional mobility training;Therapeutic activities;Therapeutic exercise;Balance training;Patient/family education;Wheelchair mobility training    PT Goals (Current goals can be found in the Care Plan section)  Acute Rehab PT Goals Patient Stated Goal: did not state PT Goal Formulation: With patient Time For Goal Achievement: 06/12/22 Potential to Achieve Goals: Fair    Frequency Min 3X/week     Co-evaluation               AM-PAC PT "6 Clicks" Mobility  Outcome Measure Help needed turning from your back to your side while in a flat bed without using bedrails?: A Little Help needed moving from lying on your back to sitting on the side of a flat bed without using bedrails?: A Little Help needed moving to and from a bed to a chair (including a wheelchair)?: A Lot Help needed standing up from a chair using your arms (e.g., wheelchair or bedside chair)?: Total Help needed to walk in hospital room?: Total Help needed climbing 3-5 steps with a railing? : Total 6 Click Score: 11    End of Session Equipment Utilized During Treatment: Oxygen Activity Tolerance: Patient limited by fatigue Patient left: in bed;with  call bell/phone within reach;with bed alarm set Nurse Communication: Mobility status PT Visit Diagnosis: Other abnormalities of gait and mobility (R26.89);Muscle weakness (generalized) (M62.81)    Time: 1655-3748 PT Time Calculation (min) (ACUTE ONLY): 23 min   Charges:   PT Evaluation $PT Eval Moderate Complexity: 1 Mod PT Treatments $Therapeutic Activity: 8-22 mins        Wyona Almas, PT, DPT Acute Rehabilitation Services Office [phone]   Deno Etienne 05/29/2022, 2:59 PM

## 2022-05-29 NOTE — Progress Notes (Signed)
PROGRESS NOTE    Terry Graves.  FAO:[id number] DOB: 12-04-1934 DOA: 05/20/2022 PCP: [name], MD   Brief Narrative:  This 87 years old male with PMH significant for ESRD on dialysis Monday Wednesday and Fridays, hypothyroidism, GERD, HFpEF, chronic A-fib on anticoagulation(off Xarelto since 7/26) OSA on CPAP, recent GI bleed presented in the ED with a GI bleed. Patient recently admitted at [facility] noted to have colonic diverticulitis and small bowel AVM bleed.  Patient was given blood transfusion for low hemoglobin.  Patient was transferred  to [facility] with a GI bleed.  Patient was mildly hypotensive,  initial Hb 5.6,  creatinine close to baseline.  Patient was initially admitted in the ICU.  He underwent colonoscopy, bleeding is stopped with clipping and epi injection.  He is found to have FDG avid lung masses on PET scan.  Palliative care consulted.  Patient moved to DNR/DNI.  Nephrology is consulted for continuation of hemodialysis. PCCM pickup 05/28/2022.  Assessment & Plan:   Principal Problem:   GI bleed Active Problems:   Diverticulosis of colon with hemorrhage   AVM (arteriovenous malformation) of small bowel, acquired   Acute on chronic anemia   Anemia due to vitamin B12 deficiency   Grade II internal hemorrhoids   Dieulafoy lesion of colon   Hematochezia   Cecal polyp  Sepsis secondary to aspiration pneumonia: Loculated parapneumonic effusion Antibiotics changed to Augmentin. Needs 4 to 6 weeks of p.o. antibiotics since pleural space not amenable to drainage. Continue supplemental oxygen to maintain SPO2 above 90% Continue bronchodilators as needed Continue midodrine for hypotension. wean as able.  Acute hypoxic respiratory failure secondary to pneumonia: Continue current antibiotics.  Continue supplemental oxygen. Pulmonary hygiene, wean as able to.  End-stage renal disease on hemodialysis: Continue Hemodialysis as per nephrology. Patient reports  worsening shortness of breath today,  feels he has gained weight. Nephrology is planning to do a short cycle hemodialysis today  Anemia of chronic disease: H&H remains stable.  Transfuse for hemoglobin less than 7.  Dysphagia: Speech and swallow evaluation recommended MBS which confirms. Dysphagia 3 diet recommended   FDG avid lung mass: Lung biopsy not indicated; family has elected they would not want this Palliative care consulted.  Awaiting recommendation   Hypothyroidism: Continue levothyroxine 25 mcg daily   Deconditioning PT and OT eval.   Restless leg syndrome Continue Requip   Goals of care discussion: Palliative care is meeting with family.     DVT prophylaxis: Eliquis Code Status: DNR Family Communication: No family at bedside Disposition Plan:   Status is: Inpatient Remains inpatient appropriate because: Admitted for GI bleeding which has been resolved.  Now patient has developed parapneumonic effusion lung mass,  family does not want aggressive intervention.  Palliative care is consulted.   Consultants:  PCCM Nephrology ID  Procedures: Colonoscopy   Antimicrobials:  Anti-infectives (From admission, onward)    Start     Dose/Rate Route Frequency Ordered Stop   05/27/22 1900  amoxicillin-clavulanate (AUGMENTIN) 500-125 MG per tablet 500 mg        500 mg Oral Every 12 hours 05/27/22 1045     05/26/22 1200  ceFEPIme (MAXIPIME) 2 g in sodium chloride 0.9 % 100 mL IVPB  Status:  Discontinued        2 g 200 mL/hr over 30 Minutes Intravenous Every M-W-F (Hemodialysis) 05/25/22 1835 05/27/22 1045   05/26/22 1200  vancomycin (VANCOREADY) IVPB 750 mg/150 mL  Status:  Discontinued  750 mg 150 mL/hr over 60 Minutes Intravenous Every M-W-F (Hemodialysis) 05/25/22 1835 05/26/22 1457   05/25/22 1930  ceFEPIme (MAXIPIME) 2 g in sodium chloride 0.9 % 100 mL IVPB        2 g 200 mL/hr over 30 Minutes Intravenous  Once 05/25/22 1835 05/25/22 2244   05/25/22 1930   vancomycin (VANCOREADY) IVPB 1500 mg/300 mL        1,500 mg 150 mL/hr over 120 Minutes Intravenous  Once 05/25/22 1835 05/25/22 2213   05/25/22 1915  metroNIDAZOLE (FLAGYL) IVPB 500 mg  Status:  Discontinued        500 mg 100 mL/hr over 60 Minutes Intravenous Every 12 hours 05/25/22 1829 05/27/22 1045      Subjective: Patient was seen and examined at bedside.  Overnight events noted.   Patient reports getting short of breath today,  states he has gained weight.  Nephrology is planning to do short cycle hemodialysis today.  Objective: Vitals:   05/28/22 2030 05/28/22 2332 05/29/22 0600 05/29/22 0826  BP: 90/60  136/77 (!) 157/93  Pulse: 90 74 93 80  Resp: 20 18 17 18   Temp: 97.6 F (36.4 C)  98.4 F (36.9 C) 98 F (36.7 C)  TempSrc: Oral  Oral Oral  SpO2: 100% 99% 100% 100%  Weight:      Height:        Intake/Output Summary (Last 24 hours) at 05/29/2022 1311 Last data filed at 05/29/2022 0900 Gross per 24 hour  Intake 240 ml  Output --  Net 240 ml   Filed Weights   05/27/22 0500 05/28/22 0525 05/28/22 1255  Weight: 76 kg 75.4 kg 74.3 kg    Examination:  General exam: Appears weak, sick looking, deconditioned, tachypneic. Respiratory system: CTA bilaterally, no wheezing, no crackles, RR 20 Cardiovascular system: S1 & S2 heard, regular rate and rhythm, no murmur.   Gastrointestinal system: Abdomen is soft, non tender, non distended, BS+. Extremities : Bilateral BKA. Skin: No rashes, lesions or ulcers Psychiatry: Judgement and insight appear normal. Mood & affect appropriate.     Data Reviewed: I have personally reviewed following labs and imaging studies  CBC: Recent Labs  Lab 05/25/22 1140 05/26/22 0120 05/27/22 0006 05/28/22 0148 05/29/22 0119  WBC 7.2 8.0 9.2 9.3 8.4  HGB 9.2* 9.5* 9.1* 9.6* 10.0*  HCT 27.8* 29.9* 27.9* 29.6* 30.2*  MCV 94.2 96.5 95.2 96.7 97.1  PLT 143* 183 162 159 161   Basic Metabolic Panel: Recent Labs  Lab 05/22/22 1423  05/22/22 2144 05/23/22 0008 05/24/22 0036 05/25/22 0059 05/26/22 0120 05/27/22 0006 05/28/22 0148  NA 136  --    < > 130* 133* 131* 134* 130*  K 3.2* 3.3*   < > 3.4* 3.3* 3.9 3.7 4.2  CL 99  --    < > 95* 96* 95* 97* 95*  CO2 30  --    < > 28 29 24 29 26   GLUCOSE 98  --    < > 132* 115* 216* 113* 88  BUN 12  --    < > 19 10 18 10 14   CREATININE 2.14*  --    < > 3.50* 2.46* 3.47* 2.48* 3.27*  CALCIUM 7.4*  --    < > 7.3* 7.6* 7.9* 7.8* 8.3*  MG  --  1.8  --   --  1.5*  --  1.7  --   PHOS 2.8  --   --   --   --   --   --   --    < > =  values in this interval not displayed.   GFR: Estimated Creatinine Clearance: 16.5 mL/min (A) (by C-G formula based on SCr of 3.27 mg/dL (H)). Liver Function Tests: Recent Labs  Lab 05/23/22 1139  PROT 4.9*   No results for input(s): "LIPASE", "AMYLASE" in the last 168 hours. No results for input(s): "AMMONIA" in the last 168 hours. Coagulation Profile: No results for input(s): "INR", "PROTIME" in the last 168 hours.  Cardiac Enzymes: No results for input(s): "CKTOTAL", "CKMB", "CKMBINDEX", "TROPONINI" in the last 168 hours. BNP (last 3 results) No results for input(s): "PROBNP" in the last 8760 hours. HbA1C: No results for input(s): "HGBA1C" in the last 72 hours. CBG: Recent Labs  Lab 05/28/22 2102 05/28/22 2340 05/29/22 0457 05/29/22 0823 05/29/22 1200  GLUCAP 168* 136* 110* 142* 166*   Lipid Profile: No results for input(s): "CHOL", "HDL", "LDLCALC", "TRIG", "CHOLHDL", "LDLDIRECT" in the last 72 hours. Thyroid Function Tests: No results for input(s): "TSH", "T4TOTAL", "FREET4", "T3FREE", "THYROIDAB" in the last 72 hours. Anemia Panel: No results for input(s): "VITAMINB12", "FOLATE", "FERRITIN", "TIBC", "IRON", "RETICCTPCT" in the last 72 hours. Sepsis Labs: Recent Labs  Lab 05/24/22 0949 05/24/22 1142  LATICACIDVEN 1.0 1.1    Recent Results (from the past 240 hour(s))  MRSA Next Gen by PCR, Nasal     Status: None    Collection Time: 05/20/22  7:19 PM   Specimen: Nasal Mucosa; Nasal Swab  Result Value Ref Range Status   MRSA by PCR Next Gen NOT DETECTED NOT DETECTED Final    Comment: (NOTE) The GeneXpert MRSA Assay (FDA approved for NASAL specimens only), is one component of a comprehensive MRSA colonization surveillance program. It is not intended to diagnose MRSA infection nor to guide or monitor treatment for MRSA infections. Test performance is not FDA approved in patients less than 27 years old. Performed at [facility], [place] [address], [place], [place] [address]   Body fluid culture w Gram Stain     Status: None   Collection Time: 05/23/22 11:04 AM   Specimen: Pleural Fluid  Result Value Ref Range Status   Specimen Description PLEURAL  Final   Special Requests FLUID  Final   Gram Stain   Final    FEW WBC PRESENT, PREDOMINANTLY MONONUCLEAR NO ORGANISMS SEEN    Culture   Final    NO GROWTH 3 DAYS Performed at [facility], [place] [address] [place], [place] [address]    Report Status 05/27/2022 FINAL  Final  MRSA Next Gen by PCR, Nasal     Status: None   Collection Time: 05/26/22  8:07 AM   Specimen: Nasal Mucosa; Nasal Swab  Result Value Ref Range Status   MRSA by PCR Next Gen NOT DETECTED NOT DETECTED Final    Comment: (NOTE) The GeneXpert MRSA Assay (FDA approved for NASAL specimens only), is one component of a comprehensive MRSA colonization surveillance program. It is not intended to diagnose MRSA infection nor to guide or monitor treatment for MRSA infections. Test performance is not FDA approved in patients less than 40 years old. Performed at [facility], [place] [address] [place], [place] [address]     Radiology Studies: DG Swallowing Func-Speech Pathology  Result Date: 05/27/2022 Table formatting from the original result was not included. Objective Swallowing Evaluation: Type of Study: MBS-Modified Barium Swallow Study  Patient Details Name: Terry Graves. MRN: [id number] Date of Birth: 06/09/1935 Today's Date: 05/27/2022 Time: SLP Start Time (ACUTE ONLY): 1410 -  SLP Stop Time (ACUTE ONLY): 1425 SLP Time Calculation (min) (ACUTE ONLY): 15 min Past Medical History: Past Medical History: Diagnosis Date  Anemia   Arthritis   Atrial fibrillation (HCC)   CHF (congestive heart failure) (HCC)   Chronic kidney disease-mineral and bone disorder   ESRD on hemodialysis ([place])   takes HD in [place] on MWF schedule  Headache   HOH (hard of hearing)   Hyperlipidemia   Hypertension   Hypothyroidism   Peripheral vascular disease (HCC)   Proteinuria   Sleep apnea   wears CPAP 12  Type 2 diabetes mellitus with stage 5 chronic kidney disease ([place])   Wears glasses  Past Surgical History: Past Surgical History: Procedure Laterality Date  A/V FISTULAGRAM N/A 03/14/2020  Procedure: A/V FISTULAGRAM - left arm;  Surgeon: Elam Dutch, MD;  Location: [facility];  Service: Cardiovascular;  Laterality: N/A;  ABDOMINAL AORTOGRAM W/LOWER EXTREMITY Bilateral 05/23/2020  Procedure: ABDOMINAL AORTOGRAM W/LOWER EXTREMITY;  Surgeon: Elam Dutch, MD;  Location: [facility];  Service: Cardiovascular;  Laterality: Bilateral;  Bilateral   AV FISTULA PLACEMENT Left 01/01/2020  Procedure: LEFT ARM ARTERIOVENOUS (AV) FISTULA CREATION;  Surgeon: Angelia Mould, MD;  Location: [facility] OR;  Service: Vascular;  Laterality: Left;  CATARACT EXTRACTION W/ INTRAOCULAR LENS  IMPLANT, BILATERAL    COLONOSCOPY    COLONOSCOPY N/A 04/09/2020  Procedure: COLONOSCOPY;  Surgeon: [name], MD;  [facility];  Service: Endoscopy;  Laterality: N/A;  COLONOSCOPY Left 05/22/2022  Procedure: COLONOSCOPY;  Surgeon: Lavena Bullion, DO;  Location: [facility];  Service: Gastroenterology;  Laterality: Left;  ESOPHAGOGASTRODUODENOSCOPY N/A 04/25/2019  Procedure: ESOPHAGOGASTRODUODENOSCOPY (EGD);  Surgeon: [name], MD;  [facility];  Service: Endoscopy;   Laterality: N/A;  ESOPHAGOGASTRODUODENOSCOPY N/A 04/08/2020  Procedure: ESOPHAGOGASTRODUODENOSCOPY (EGD);  Surgeon: [name], MD;  [facility];  Service: Endoscopy;  Laterality: N/A;  GIVENS CAPSULE STUDY N/A 04/16/2020  Procedure: GIVENS CAPSULE STUDY;  Surgeon: [name], MD;  [facility];  Service: Endoscopy;  Laterality: N/A;  [place]  05/22/2022  Procedure: HEMOSTASIS CLIP PLACEMENT;  Surgeon: Lavena Bullion, DO;  Location: [facility];  Service: Gastroenterology;;  HIP ARTHROSCOPY Left   INSERTION OF DIALYSIS CATHETER Right 01/01/2020  Procedure: INSERTION OF RIGHT INTERNAL JUGULAR DIALYSIS CATHETER;  Surgeon: Angelia Mould, MD;  Location: [facility];  Service: Vascular;  Laterality: Right;  LIGATION OF COMPETING BRANCHES OF ARTERIOVENOUS FISTULA Left 03/28/2020  Procedure: SIDE BRANCH LIGATION TIMES THREE OF LEFT ARM  ARTERIOVENOUS FISTULA;  Surgeon: Marty Heck, MD;  Location: [facility];  Service: Vascular;  Laterality: Left;  POLYPECTOMY  05/22/2022  Procedure: POLYPECTOMY;  Surgeon: Lavena Bullion, DO;  Location: [facility];  Service: Gastroenterology;;  Clide Deutscher  05/22/2022  Procedure: Clide Deutscher;  Surgeon: Lavena Bullion, DO;  Location: [facility];  Service: Gastroenterology;; HPI: Patient is an 87 y.o. male with PMH: ESRD on HD, h/o lung masses, GERD, OSA on CPAP, ,recent GI bleed who presented to [facility] ED on 8/10 with GI bleed. He was mildly hypotensive but otherwise hemodynamically stable, afebrile on RA, initial Hgb 5.9 and given 2 units PRBCs. RN who had patient this morning reported he had been having some coughing while eating meals.  Subjective: pleasant, stating that his back hurts  Recommendations for follow up therapy are one component of a multi-disciplinary discharge planning process, led by the attending physician.  Recommendations may be updated  based on patient status, additional functional criteria and  insurance authorization. Assessment / Plan / Recommendation   05/27/2022   3:13 PM Clinical Impressions Clinical Impression Patient presents with a mild oral phase dysphagia and a pharyngeal phase of swallow that appears [facility]. He exhibited brief delays with anterior to posterior transit with solids but no oral residuals and good bolus cohesion. Swallow initiation was timely for all tested consistencies (thin and nectar thick liquids, puree solids, 77mm barium tablet and regular solids). No penetration or aspiration observed with liquids or solids. When swallowing barium tablet with thin liquid barium, patient had difficulty keeping a cohesive bolus and benefited from sips of nectar thick liquids to help transit. Tablet became briefly lodged at level of UES but otherwise transited without difficulty. Esophageal sweep revealed retrograde movement of thin liquid barium in distal esophagus as well as barium tablet briefly stuck in distal portion of esophagus. He had frequent belching during the MBS and reported to SLP that he has been coughing up a log of phlegm today. SLP is recommending continue on Dys 3 solids, thin liquids. SLP will plan to f/u at least one more time to ensure toleration but suspect that his symptoms are occuring at the esophageal phase.     05/27/2022   3:11 PM Treatment Recommendations Treatment Recommendations Therapy as outlined in treatment plan below     05/27/2022   3:18 PM Prognosis Prognosis for Safe Diet Advancement Good Barriers to Reach Goals Time post onset   05/27/2022   3:11 PM Diet Recommendations SLP Diet Recommendations Regular solids;Dysphagia 3 (Mech soft) solids;Thin liquid Liquid Administration via Cup;Straw Medication Administration Whole meds with puree Compensations Slow rate;Small sips/bites Postural Changes Seated upright at 90 degrees;Remain semi-upright after after feeds/meals (Comment)     05/27/2022   3:11 PM Other Recommendations Oral Care Recommendations Oral care BID  Follow Up Recommendations No SLP follow up Assistance recommended at discharge Intermittent Supervision/Assistance Functional Status Assessment Patient has had a recent decline in their functional status and demonstrates the ability to make significant improvements in function in a reasonable and predictable amount of time.   05/27/2022   3:11 PM Frequency and Duration  Speech Therapy Frequency (ACUTE ONLY) min 1 x/week Treatment Duration 1 week     05/27/2022   3:10 PM Oral Phase Oral Phase Impaired Oral - Nectar Cup WFL Oral - Thin Cup WFL Oral - Thin Straw WFL Oral - Puree Reduced posterior propulsion Oral - Mech Soft Reduced posterior propulsion Oral - Pill Decreased bolus cohesion;Reduced posterior propulsion    05/27/2022   3:10 PM Pharyngeal Phase Pharyngeal Phase Impaired Pharyngeal- Nectar Cup [facility] Pharyngeal- Thin Cup WFL Pharyngeal- Thin Straw WFL Pharyngeal- Puree WFL Pharyngeal- Mechanical Soft WFL Pharyngeal- Pill Reduced pharyngeal peristalsis;Pharyngeal residue - cp segment    05/27/2022   3:11 PM Cervical Esophageal Phase  Cervical Esophageal Phase [facility] Sonia Baller, MA, CCC-SLP Speech Therapy                       Scheduled Meds:  amoxicillin-clavulanate  500 mg Oral Q12H   apixaban  2.5 mg Oral BID   atorvastatin  40 mg Oral QHS   Chlorhexidine Gluconate Cloth  6 each Topical Q0600   cyanocobalamin  1,000 mcg Intramuscular Q7 days   insulin aspart  1-3 Units Subcutaneous Q4H   levothyroxine  25 mcg Oral QAC breakfast   midodrine  20 mg Oral Q8H   pantoprazole  40 mg Oral Daily  rOPINIRole  0.5 mg Oral QHS   sodium chloride flush  10-40 mL Intracatheter Q12H   sodium chloride flush  3 mL Intravenous Q12H   Continuous Infusions:   LOS: 9 days    Time spent: 35 mins    Isis Costanza, MD [facility]   If 7PM-7AM, please contact night-coverage

## 2022-05-29 NOTE — Progress Notes (Addendum)
Unable to to meet fluid goal removal of 2L due to patient's low blood pressure inspite of giving 25 g of albumin and 20 mg of Mitodrine.No fluid removed becouse of above reason.

## 2022-05-29 NOTE — Progress Notes (Signed)
[facility] Progress Note   Subjective:   Patient seen and examined at bedside.  Reports worsened shortness of breath since waking this AM.  He feels like he is swelling.  Discussed having shortened HD for volume removal today to see if it helps improve his symptoms. Denies CP, abdominal pain and n/v/d.    Objective Vitals:   05/28/22 2030 05/28/22 2332 05/29/22 0600 05/29/22 0826  BP: 90/60  136/77 (!) 157/93  Pulse: 90 74 93 80  Resp: 20 18 17 18   Temp: 97.6 F (36.4 C)  98.4 F (36.9 C) 98 F (36.7 C)  TempSrc: Oral  Oral Oral  SpO2: 100% 99% 100% 100%  Weight:      Height:       Physical Exam General:chronically ill appearing male in NAD Heart:IRIR, no MRG Lungs:+rhonchi, +crackles, breath sounds decreased Abdomen:soft, NTND Extremities:1+ edema in hips, L BKA, R AKA Dialysis Access: LU AVF +b/t  Filed Weights   05/27/22 0500 05/28/22 0525 05/28/22 1255  Weight: 76 kg 75.4 kg 74.3 kg    Intake/Output Summary (Last 24 hours) at 05/29/2022 1147 Last data filed at 05/29/2022 0900 Gross per 24 hour  Intake 240 ml  Output 2000 ml  Net -1760 ml    Additional Objective Labs: Basic Metabolic Panel: Recent Labs  Lab 05/22/22 1423 05/22/22 2144 05/26/22 0120 05/27/22 0006 05/28/22 0148  NA 136   < > 131* 134* 130*  K 3.2*   < > 3.9 3.7 4.2  CL 99   < > 95* 97* 95*  CO2 30   < > 24 29 26   GLUCOSE 98   < > 216* 113* 88  BUN 12   < > 18 10 14   CREATININE 2.14*   < > 3.47* 2.48* 3.27*  CALCIUM 7.4*   < > 7.9* 7.8* 8.3*  PHOS 2.8  --   --   --   --    < > = values in this interval not displayed.   Liver Function Tests: Recent Labs  Lab 05/23/22 1139  PROT 4.9*   CBC: Recent Labs  Lab 05/25/22 1140 05/26/22 0120 05/27/22 0006 05/28/22 0148 05/29/22 0119  WBC 7.2 8.0 9.2 9.3 8.4  HGB 9.2* 9.5* 9.1* 9.6* 10.0*  HCT 27.8* 29.9* 27.9* 29.6* 30.2*  MCV 94.2 96.5 95.2 96.7 97.1  PLT 143* 183 162 159 155   CBG: Recent Labs  Lab  05/28/22 1610 05/28/22 2102 05/28/22 2340 05/29/22 0457 05/29/22 0823  GLUCAP 89 168* 136* 110* 142*    Studies/Results: DG Swallowing Func-Speech Pathology  Result Date: 05/27/2022 Table formatting from the original result was not included. Objective Swallowing Evaluation: Type of Study: MBS-Modified Barium Swallow Study  Patient Details Name: Terry Graves. MRN: [id number] Date of Birth: [birth year] Today's Date: 05/27/2022 Time: SLP Start Time (ACUTE ONLY): 71 -SLP Stop Time (ACUTE ONLY): 1425 SLP Time Calculation (min) (ACUTE ONLY): 15 min Past Medical History: Past Medical History: Diagnosis Date  Anemia   Arthritis   Atrial fibrillation (HCC)   CHF (congestive heart failure) (HCC)   Chronic kidney disease-mineral and bone disorder   ESRD on hemodialysis ([place])   takes HD in [place] on MWF schedule  Headache   HOH (hard of hearing)   Hyperlipidemia   Hypertension   Hypothyroidism   Peripheral vascular disease (HCC)   Proteinuria   Sleep apnea   wears CPAP 12  Type 2 diabetes mellitus with stage 5 chronic kidney disease ([place])  Wears glasses  Past Surgical History: Past Surgical History: Procedure Laterality Date  A/V FISTULAGRAM N/A 03/14/2020  Procedure: A/V FISTULAGRAM - left arm;  Surgeon: Elam Dutch, MD;  Location: [place] [facility];  Service: Cardiovascular;  Laterality: N/A;  ABDOMINAL AORTOGRAM W/LOWER EXTREMITY Bilateral 05/23/2020  Procedure: ABDOMINAL AORTOGRAM W/LOWER EXTREMITY;  Surgeon: Elam Dutch, MD;  Location: [place] [facility];  Service: Cardiovascular;  Laterality: Bilateral;  Bilateral   AV FISTULA PLACEMENT Left 01/01/2020  Procedure: LEFT ARM ARTERIOVENOUS (AV) FISTULA CREATION;  Surgeon: Angelia Mould, MD;  Location: [facility] OR;  Service: Vascular;  Laterality: Left;  CATARACT EXTRACTION W/ INTRAOCULAR LENS  IMPLANT, BILATERAL    COLONOSCOPY    COLONOSCOPY N/A 04/09/2020  Procedure: COLONOSCOPY;  Surgeon: [name], MD;  [facility];   Service: Endoscopy;  Laterality: N/A;  COLONOSCOPY Left 05/22/2022  Procedure: COLONOSCOPY;  Surgeon: Lavena Bullion, DO;  Location: [place];  Service: Gastroenterology;  Laterality: Left;  ESOPHAGOGASTRODUODENOSCOPY N/A 04/25/2019  Procedure: ESOPHAGOGASTRODUODENOSCOPY (EGD);  Surgeon: [name], MD;  [facility];  Service: Endoscopy;  Laterality: N/A;  ESOPHAGOGASTRODUODENOSCOPY N/A 04/08/2020  Procedure: ESOPHAGOGASTRODUODENOSCOPY (EGD);  Surgeon: [name], MD;  [facility];  Service: Endoscopy;  Laterality: N/A;  GIVENS CAPSULE STUDY N/A 04/16/2020  Procedure: GIVENS CAPSULE STUDY;  Surgeon: [name], MD;  [facility];  Service: Endoscopy;  Laterality: N/A;  [place]  05/22/2022  Procedure: HEMOSTASIS CLIP PLACEMENT;  Surgeon: Lavena Bullion, DO;  Location: [facility] [facility];  Service: Gastroenterology;;  HIP ARTHROSCOPY Left   INSERTION OF DIALYSIS CATHETER Right 01/01/2020  Procedure: INSERTION OF RIGHT INTERNAL JUGULAR DIALYSIS CATHETER;  Surgeon: Angelia Mould, MD;  Location: [place];  Service: Vascular;  Laterality: Right;  LIGATION OF COMPETING BRANCHES OF ARTERIOVENOUS FISTULA Left 03/28/2020  Procedure: SIDE BRANCH LIGATION TIMES THREE OF LEFT ARM  ARTERIOVENOUS FISTULA;  Surgeon: Marty Heck, MD;  Location: [place];  Service: Vascular;  Laterality: Left;  POLYPECTOMY  05/22/2022  Procedure: POLYPECTOMY;  Surgeon: Lavena Bullion, DO;  Location: [place];  Service: Gastroenterology;;  Clide Deutscher  05/22/2022  Procedure: Clide Deutscher;  Surgeon: Lavena Bullion, DO;  Location: [facility];  Service: Gastroenterology;; HPI: Patient is an 87 y.o. male with PMH: ESRD on HD, h/o lung masses, GERD, OSA on CPAP, ,recent GI bleed who presented to [facility] ED on 8/10 with GI bleed. He was mildly hypotensive but otherwise hemodynamically stable, afebrile on RA, initial Hgb 5.9 and given 2 units PRBCs. RN who had  patient this morning reported he had been having some coughing while eating meals.  Subjective: pleasant, stating that his back hurts  Recommendations for follow up therapy are one component of a multi-disciplinary discharge planning process, led by the attending physician.  Recommendations may be updated based on patient status, additional functional criteria and insurance authorization. Assessment / Plan / Recommendation   05/27/2022   3:13 PM Clinical Impressions Clinical Impression Patient presents with a mild oral phase dysphagia and a pharyngeal phase of swallow that appears [facility]. He exhibited brief delays with anterior to posterior transit with solids but no oral residuals and good bolus cohesion. Swallow initiation was timely for all tested consistencies (thin and nectar thick liquids, puree solids, 18mm barium tablet and regular solids). No penetration or aspiration observed with liquids or solids. When swallowing barium tablet with thin liquid barium, patient had difficulty keeping a cohesive bolus and benefited from  sips of nectar thick liquids to help transit. Tablet became briefly lodged at level of UES but otherwise transited without difficulty. Esophageal sweep revealed retrograde movement of thin liquid barium in distal esophagus as well as barium tablet briefly stuck in distal portion of esophagus. He had frequent belching during the MBS and reported to SLP that he has been coughing up a log of phlegm today. SLP is recommending continue on Dys 3 solids, thin liquids. SLP will plan to f/u at least one more time to ensure toleration but suspect that his symptoms are occuring at the esophageal phase.     05/27/2022   3:11 PM Treatment Recommendations Treatment Recommendations Therapy as outlined in treatment plan below     05/27/2022   3:18 PM Prognosis Prognosis for Safe Diet Advancement Good Barriers to Reach Goals Time post onset   05/27/2022   3:11 PM Diet Recommendations SLP Diet Recommendations  Regular solids;Dysphagia 3 (Mech soft) solids;Thin liquid Liquid Administration via Cup;Straw Medication Administration Whole meds with puree Compensations Slow rate;Small sips/bites Postural Changes Seated upright at 90 degrees;Remain semi-upright after after feeds/meals (Comment)     05/27/2022   3:11 PM Other Recommendations Oral Care Recommendations Oral care BID Follow Up Recommendations No SLP follow up Assistance recommended at discharge Intermittent Supervision/Assistance Functional Status Assessment Patient has had a recent decline in their functional status and demonstrates the ability to make significant improvements in function in a reasonable and predictable amount of time.   05/27/2022   3:11 PM Frequency and Duration  Speech Therapy Frequency (ACUTE ONLY) min 1 x/week Treatment Duration 1 week     05/27/2022   3:10 PM Oral Phase Oral Phase Impaired Oral - Nectar Cup WFL Oral - Thin Cup WFL Oral - Thin Straw WFL Oral - Puree Reduced posterior propulsion Oral - Mech Soft Reduced posterior propulsion Oral - Pill Decreased bolus cohesion;Reduced posterior propulsion    05/27/2022   3:10 PM Pharyngeal Phase Pharyngeal Phase Impaired Pharyngeal- Nectar Cup [facility] Pharyngeal- Thin Cup WFL Pharyngeal- Thin Straw WFL Pharyngeal- Puree WFL Pharyngeal- Mechanical Soft WFL Pharyngeal- Pill Reduced pharyngeal peristalsis;Pharyngeal residue - cp segment    05/27/2022   3:11 PM Cervical Esophageal Phase  Cervical Esophageal Phase [facility] Sonia Baller, MA, CCC-SLP Speech Therapy                      Medications:   amoxicillin-clavulanate  500 mg Oral Q12H   apixaban  2.5 mg Oral BID   atorvastatin  40 mg Oral QHS   Chlorhexidine Gluconate Cloth  6 each Topical Q0600   cyanocobalamin  1,000 mcg Intramuscular Q7 days   insulin aspart  1-3 Units Subcutaneous Q4H   levothyroxine  25 mcg Oral QAC breakfast   midodrine  20 mg Oral Q8H   pantoprazole  40 mg Oral Daily   rOPINIRole  0.5 mg Oral QHS   sodium chloride  flush  10-40 mL Intracatheter Q12H   sodium chloride flush  3 mL Intravenous Q12H    Dialysis Orders: MWF [facility] (> per Care Everywhere)  3h 66min  400/2.0  73kg  2/2.5 bath  LUA AVG   16ga  Hep ? 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 S/p PRBCs. Hgb stable in 9s. Shock - due to hemorrhage as above, not on pressors at this  time Aspiration Pna: s/p vanc/cefepime, transitioned to augmentin.  Per PCCM Pleural effusions/ lung mass - by CT. Is being f/b doctor in [place] for lung mass. Per CCM. S/p thora 8/13-850cc drained. Location not amendable to chest tube placement by pulm or IR. Palliative consulted. Per PMD. ESRD - on HD MWF in [place]. Extra HD off schedule today for volume removal. Resume regular schedule on Monday.  Anemia esrd - Hb 5.9 on admission.. Fe replete on panel. . Hgb 9.6--stopped ESA given lung mass and concerning PET scan MBD ckd - CCa in range, phos on low side.  No binders needed. Cont to monitor, replete if needed Atrial fib -restarted on hep gtt HTN/volume - on midodrine. Edema noted in hips, increased SOB.  Will order extra HD today for volume removal and see if it helps improve his symptoms.   Jen Mow, PA-C [facility] 05/29/2022,11:47 AM  LOS: 9 days

## 2022-05-30 DIAGNOSIS — K922 Gastrointestinal hemorrhage, unspecified: Secondary | ICD-10-CM | POA: Diagnosis not present

## 2022-05-30 LAB — CBC
HCT: 30.3 % — ABNORMAL LOW (ref 39.0–52.0)
Hemoglobin: 9.4 g/dL — ABNORMAL LOW (ref 13.0–17.0)
MCH: 31 pg (ref 26.0–34.0)
MCHC: 31 g/dL (ref 30.0–36.0)
MCV: 100 fL (ref 80.0–100.0)
Platelets: 151 10*3/uL (ref 150–400)
RBC: 3.03 MIL/uL — ABNORMAL LOW (ref 4.22–5.81)
RDW: 17.7 % — ABNORMAL HIGH (ref 11.5–15.5)
WBC: 9.7 10*3/uL (ref 4.0–10.5)
nRBC: 0 % (ref 0.0–0.2)

## 2022-05-30 LAB — BASIC METABOLIC PANEL
Anion gap: 11 (ref 5–15)
BUN: 6 mg/dL — ABNORMAL LOW (ref 8–23)
CO2: 28 mmol/L (ref 22–32)
Calcium: 8.5 mg/dL — ABNORMAL LOW (ref 8.9–10.3)
Chloride: 95 mmol/L — ABNORMAL LOW (ref 98–111)
Creatinine, Ser: 2.16 mg/dL — ABNORMAL HIGH (ref 0.61–1.24)
GFR, Estimated: 29 mL/min — ABNORMAL LOW (ref >60)
Glucose, Bld: 138 mg/dL — ABNORMAL HIGH (ref 70–99)
Potassium: 3.9 mmol/L (ref 3.5–5.1)
Sodium: 134 mmol/L — ABNORMAL LOW (ref 135–145)

## 2022-05-30 LAB — GLUCOSE, CAPILLARY
Glucose-Capillary: 114 mg/dL — ABNORMAL HIGH (ref 70–99)
Glucose-Capillary: 167 mg/dL — ABNORMAL HIGH (ref 70–99)
Glucose-Capillary: 119 mg/dL — ABNORMAL HIGH (ref 70–99)
Glucose-Capillary: 189 mg/dL — ABNORMAL HIGH (ref 70–99)
Glucose-Capillary: 210 mg/dL — ABNORMAL HIGH (ref 70–99)
Glucose-Capillary: 206 mg/dL — ABNORMAL HIGH (ref 70–99)
Glucose-Capillary: 167 mg/dL — ABNORMAL HIGH (ref 70–99)

## 2022-05-30 MED ORDER — CHLORHEXIDINE GLUCONATE CLOTH 2 % EX PADS
6.0000 | MEDICATED_PAD | Freq: Every day | CUTANEOUS | Status: DC
  Administered 2022-05-31 – 2022-06-01 (×2): 6 via TOPICAL

## 2022-05-30 NOTE — Progress Notes (Signed)
Noted pt already has a flutter valve at bedside, pt states he's used it 2-3 times this morning.  MD at bedside to assess pt now.

## 2022-05-30 NOTE — NC FL2 (Signed)
[place] LEVEL OF CARE SCREENING TOOL     IDENTIFICATION  Patient Name: Terry Graves. Birthdate: December 30, 1934 Sex: male Admission Date (Current Location): 05/20/2022  [facility] and [place] Number:  Herbalist and Address:  [facility] [place] [address], [place], [place] [address]      Provider Number: [id number]  Attending Physician Name and Address:  Shawna Clamp, MD  Relative Name and Phone Number:       Current Level of Care: Hospital Recommended Level of Care: [place] Prior Approval Number:    Date Approved/Denied:   PASRR Number: [id number]  Discharge Plan: SNF    Current Diagnoses: Patient Active Problem List   Diagnosis Date Noted   Grade II internal hemorrhoids    Dieulafoy lesion of colon    Hematochezia    Cecal polyp    Diverticulosis of colon with hemorrhage    AVM (arteriovenous malformation) of small bowel, acquired    Acute on chronic anemia    Anemia due to vitamin B12 deficiency    GI bleed 05/20/2022   Hemorrhagic shock (HCC)    Chronic atrial fibrillation (HCC)    Chronic heart failure with preserved ejection fraction (HCC)    GI bleeding 04/07/2020   CHF (congestive heart failure) ([place])    HOH (hard of hearing)    Hypothyroidism    Sleep apnea    ESRD on hemodialysis ([place])    Hyperglycemia    History of Prepyloric ulcer    Melena 04/25/2019   Acute blood loss anemia 04/25/2019   Heme positive stool    Symptomatic anemia    Gastrointestinal hemorrhage with melena 04/24/2019   Hypertension    Anemia due to stage 5 chronic kidney disease (HCC)    Hyperlipidemia    Type 2 diabetes mellitus with stage 5 chronic kidney disease (HCC)     Orientation RESPIRATION BLADDER Height & Weight     Self, Time, Place, Situation  Normal Continent Weight: 159 lb 6.3 oz (72.3 kg) Height:  5\' 9"  (175.3 cm)  BEHAVIORAL SYMPTOMS/MOOD NEUROLOGICAL BOWEL NUTRITION STATUS      Continent  Diet (DYS 3)  AMBULATORY STATUS COMMUNICATION OF NEEDS Skin   Limited Assist Verbally Normal                       Personal Care Assistance Level of Assistance  Bathing, Feeding, Dressing Bathing Assistance: Limited assistance Feeding assistance: Independent Dressing Assistance: Limited assistance     Functional Limitations Info             SPECIAL CARE FACTORS FREQUENCY  PT (By licensed PT), OT (By licensed OT)     PT Frequency: 5x weekly OT Frequency: 5x weekly            Contractures Contractures Info: Not present    Additional Factors Info  Allergies, Code Status Code Status Info: DNR Allergies Info: quinine           Current Medications (05/30/2022):  This is the current hospital active medication list Current Facility-Administered Medications  Medication Dose Route Frequency Provider Last Rate Last Admin   amoxicillin-clavulanate (AUGMENTIN) 500-125 MG per tablet 500 mg  500 mg Oral Q12H Masters, Katie, DO   500 mg at 05/30/22 4401   apixaban (ELIQUIS) tablet 2.5 mg  2.5 mg Oral BID Masters, Katie, DO   2.5 mg at 05/30/22 0956   atorvastatin (LIPITOR) tablet 40 mg  40 mg  Oral QHS Masters, Katie, DO   40 mg at 05/29/22 2127   benzonatate (TESSALON) capsule 100 mg  100 mg Oral TID PRN Masters, Joellen Jersey, DO   100 mg at 05/29/22 2126   Chlorhexidine Gluconate Cloth 2 % PADS 6 each  6 each Topical Q0600 Penninger, Ria Comment, PA   6 each at 05/30/22 0557   [START ON 05/31/2022] Chlorhexidine Gluconate Cloth 2 % PADS 6 each  6 each Topical Q0600 Penninger, Ria Comment, PA       cyanocobalamin (VITAMIN B12) injection 1,000 mcg  1,000 mcg Intramuscular Q7 days Masters, Katie, DO   1,000 mcg at 05/28/22 1804   docusate sodium (COLACE) capsule 100 mg  100 mg Oral BID PRN Masters, Katie, DO       guaiFENesin-dextromethorphan (ROBITUSSIN DM) 100-10 MG/5ML syrup 5 mL  5 mL Oral Q4H PRN Masters, Katie, DO   5 mL at 05/30/22 1234   HYDROmorphone (DILAUDID) tablet 1 mg  1 mg Oral  Q4H PRN Masters, Katie, DO   1 mg at 05/27/22 1100   insulin aspart (novoLOG) injection 1-3 Units  1-3 Units Subcutaneous Q4H Masters, Katie, DO   2 Units at 05/30/22 1234   ipratropium-albuterol (DUONEB) 0.5-2.5 (3) MG/3ML nebulizer solution 3 mL  3 mL Nebulization Q6H PRN Masters, Joellen Jersey, DO   3 mL at 05/30/22 0920   levothyroxine (SYNTHROID) tablet 25 mcg  25 mcg Oral QAC breakfast Masters, Katie, DO   25 mcg at 05/30/22 0554   midodrine (PROAMATINE) tablet 20 mg  20 mg Oral Q8H Masters, Katie, DO   20 mg at 05/30/22 6712   Oral care mouth rinse  15 mL Mouth Rinse PRN Masters, Joellen Jersey, DO       pantoprazole (PROTONIX) EC tablet 40 mg  40 mg Oral Daily Masters, Katie, DO   40 mg at 05/30/22 0956   polyethylene glycol (MIRALAX / GLYCOLAX) packet 17 g  17 g Oral Daily PRN Masters, Katie, DO       rOPINIRole (REQUIP) tablet 0.5 mg  0.5 mg Oral QHS Masters, Katie, DO   0.5 mg at 05/29/22 2126   sodium chloride flush (NS) 0.9 % injection 10-40 mL  10-40 mL Intracatheter Q12H Masters, Katie, DO   10 mL at 05/29/22 1052   sodium chloride flush (NS) 0.9 % injection 10-40 mL  10-40 mL Intracatheter PRN Masters, Joellen Jersey, DO   20 mL at 05/26/22 2124   sodium chloride flush (NS) 0.9 % injection 3 mL  3 mL Intravenous Q12H Masters, Katie, DO   3 mL at 05/29/22 1052     Discharge Medications: Please see discharge summary for a list of discharge medications.  Relevant Imaging Results:  Relevant Lab Results:   Additional Information SSN: [id number]  Alfredia Ferguson, LCSW

## 2022-05-30 NOTE — Progress Notes (Signed)
PROGRESS NOTE    Terry Graves.  HAL:[id number] DOB: 1935-03-14 DOA: 05/20/2022 PCP: [name], MD   Brief Narrative:  This 87 years old male with PMH significant for ESRD on dialysis Monday Wednesday and Fridays, hypothyroidism, GERD, HFpEF, chronic A-fib on anticoagulation(off Xarelto since 7/26) OSA on CPAP, recent GI bleed presented in the ED with a GI bleed. Patient recently admitted at [facility] noted to have colonic diverticulitis and small bowel AVM bleed.  Patient was given blood transfusion for low hemoglobin.  Patient was transferred  to [facility] with a GI bleed.  Patient was mildly hypotensive,  initial Hb 5.6,  creatinine close to baseline.  Patient was initially admitted in the ICU.  He underwent colonoscopy, bleeding is stopped with clipping and epi injection.  He is found to have FDG avid lung masses on PET scan.  Palliative care consulted.  Patient moved to DNR/DNI.  Nephrology is consulted for continuation of hemodialysis. PCCM pickup 05/28/2022.  Assessment & Plan:   Principal Problem:   GI bleed Active Problems:   Diverticulosis of colon with hemorrhage   AVM (arteriovenous malformation) of small bowel, acquired   Acute on chronic anemia   Anemia due to vitamin B12 deficiency   Grade II internal hemorrhoids   Dieulafoy lesion of colon   Hematochezia   Cecal polyp  Sepsis secondary to aspiration pneumonia: Loculated parapneumonic effusion Antibiotics changed to Augmentin. Needs 4 to 6 weeks of p.o. antibiotics since pleural space not amenable to drainage. Continue supplemental oxygen to maintain SPO2 above 90% Continue bronchodilators as needed Continue midodrine for hypotension. wean as able.  Acute hypoxic respiratory failure secondary to pneumonia: Continue current antibiotics.  Continue supplemental oxygen now requiring 4 L/min Pulmonary hygiene, wean as able to.  End-stage renal disease on hemodialysis: Continue Hemodialysis as per  nephrology. Patient reports worsening shortness of breath ,  feels he has gained weight. Nephrology completed short cycle of dialysis.  Anemia of chronic disease: H&H remains stable.  Transfuse for hemoglobin less than 7.  Dysphagia: Speech and swallow evaluation recommended MBS which confirms. Dysphagia 3 diet recommended   FDG avid lung mass: Lung biopsy not indicated; family has elected they would not want this Palliative care consulted.  Awaiting recommendation   Hypothyroidism: Continue levothyroxine 25 mcg daily   Deconditioning PT and OT eval.   Restless leg syndrome Continue Requip   Goals of care discussion: Palliative care is meeting with family.     DVT prophylaxis: Eliquis Code Status: DNR Family Communication: No family at bedside Disposition Plan:   Status is: Inpatient Remains inpatient appropriate because: Admitted for GI bleeding which has been resolved.  Now patient has developed parapneumonic effusion lung mass,  family does not want aggressive intervention.  Palliative care is consulted.   Consultants:  PCCM Nephrology ID  Procedures: Colonoscopy   Antimicrobials:  Anti-infectives (From admission, onward)    Start     Dose/Rate Route Frequency Ordered Stop   05/27/22 1900  amoxicillin-clavulanate (AUGMENTIN) 500-125 MG per tablet 500 mg        500 mg Oral Every 12 hours 05/27/22 1045     05/26/22 1200  ceFEPIme (MAXIPIME) 2 g in sodium chloride 0.9 % 100 mL IVPB  Status:  Discontinued        2 g 200 mL/hr over 30 Minutes Intravenous Every M-W-F (Hemodialysis) 05/25/22 1835 05/27/22 1045   05/26/22 1200  vancomycin (VANCOREADY) IVPB 750 mg/150 mL  Status:  Discontinued  750 mg 150 mL/hr over 60 Minutes Intravenous Every M-W-F (Hemodialysis) 05/25/22 1835 05/26/22 1457   05/25/22 1930  ceFEPIme (MAXIPIME) 2 g in sodium chloride 0.9 % 100 mL IVPB        2 g 200 mL/hr over 30 Minutes Intravenous  Once 05/25/22 1835 05/25/22 2244    05/25/22 1930  vancomycin (VANCOREADY) IVPB 1500 mg/300 mL        1,500 mg 150 mL/hr over 120 Minutes Intravenous  Once 05/25/22 1835 05/25/22 2213   05/25/22 1915  metroNIDAZOLE (FLAGYL) IVPB 500 mg  Status:  Discontinued        500 mg 100 mL/hr over 60 Minutes Intravenous Every 12 hours 05/25/22 1829 05/27/22 1045      Subjective: Patient was seen and examined at bedside.  Overnight events noted.   Patient reports feeling better today after getting short cycle of dialysis. He denies any chest pain, dizziness, palpitation, shortness of breath   Objective: Vitals:   05/30/22 0838 05/30/22 0920 05/30/22 1000 05/30/22 1218  BP: 135/72  134/65 138/79  Pulse: (!) 108  93 85  Resp:   (!) 24 (!) 22  Temp: 98.6 F (37 C)  98 F (36.7 C)   TempSrc: Oral  Oral   SpO2:  100% 98% 96%  Weight:      Height:        Intake/Output Summary (Last 24 hours) at 05/30/2022 1324 Last data filed at 05/30/2022 0743 Gross per 24 hour  Intake 600 ml  Output 100 ml  Net 500 ml   Filed Weights   05/28/22 1255 05/29/22 1455 05/29/22 1824  Weight: 74.3 kg 73.2 kg 72.3 kg    Examination:  General exam: Appears weak, frail, deconditioned, sick looking.  Not in any distress Respiratory system: CTA bilaterally, respiratory effort normal, RR 15 Cardiovascular system: S1 & S2 heard, regular rate and rhythm, no murmur.   Gastrointestinal system: Abdomen is soft, non tender, non distended, BS+. Extremities : Bilateral BKA. Skin: No rashes, lesions or ulcers Psychiatry: Judgement and insight appear normal. Mood & affect appropriate.     Data Reviewed: I have personally reviewed following labs and imaging studies  CBC: Recent Labs  Lab 05/26/22 0120 05/27/22 0006 05/28/22 0148 05/29/22 0119 05/30/22 0229  WBC 8.0 9.2 9.3 8.4 9.7  HGB 9.5* 9.1* 9.6* 10.0* 9.4*  HCT 29.9* 27.9* 29.6* 30.2* 30.3*  MCV 96.5 95.2 96.7 97.1 100.0  PLT 183 162 159 155 779   Basic Metabolic Panel: Recent Labs   Lab 05/25/22 0059 05/26/22 0120 05/27/22 0006 05/28/22 0148 05/30/22 0229  NA 133* 131* 134* 130* 134*  K 3.3* 3.9 3.7 4.2 3.9  CL 96* 95* 97* 95* 95*  CO2 29 24 29 26 28   GLUCOSE 115* 216* 113* 88 138*  BUN 10 18 10 14  6*  CREATININE 2.46* 3.47* 2.48* 3.27* 2.16*  CALCIUM 7.6* 7.9* 7.8* 8.3* 8.5*  MG 1.5*  --  1.7  --   --    GFR: Estimated Creatinine Clearance: 25 mL/min (A) (by C-G formula based on SCr of 2.16 mg/dL (H)). Liver Function Tests: No results for input(s): "AST", "ALT", "ALKPHOS", "BILITOT", "PROT", "ALBUMIN" in the last 168 hours.  No results for input(s): "LIPASE", "AMYLASE" in the last 168 hours. No results for input(s): "AMMONIA" in the last 168 hours. Coagulation Profile: No results for input(s): "INR", "PROTIME" in the last 168 hours.  Cardiac Enzymes: No results for input(s): "CKTOTAL", "CKMB", "CKMBINDEX", "TROPONINI" in the last 168 hours.  BNP (last 3 results) No results for input(s): "PROBNP" in the last 8760 hours. HbA1C: No results for input(s): "HGBA1C" in the last 72 hours. CBG: Recent Labs  Lab 05/29/22 1946 05/30/22 0011 05/30/22 0546 05/30/22 0806 05/30/22 1223  GLUCAP 127* 167* 114* 119* 189*   Lipid Profile: No results for input(s): "CHOL", "HDL", "LDLCALC", "TRIG", "CHOLHDL", "LDLDIRECT" in the last 72 hours. Thyroid Function Tests: No results for input(s): "TSH", "T4TOTAL", "FREET4", "T3FREE", "THYROIDAB" in the last 72 hours. Anemia Panel: No results for input(s): "VITAMINB12", "FOLATE", "FERRITIN", "TIBC", "IRON", "RETICCTPCT" in the last 72 hours. Sepsis Labs: Recent Labs  Lab 05/24/22 0949 05/24/22 1142  LATICACIDVEN 1.0 1.1    Recent Results (from the past 240 hour(s))  MRSA Next Gen by PCR, Nasal     Status: None   Collection Time: 05/20/22  7:19 PM   Specimen: Nasal Mucosa; Nasal Swab  Result Value Ref Range Status   MRSA by PCR Next Gen NOT DETECTED NOT DETECTED Final    Comment: (NOTE) The GeneXpert MRSA  Assay (FDA approved for NASAL specimens only), is one component of a comprehensive MRSA colonization surveillance program. It is not intended to diagnose MRSA infection nor to guide or monitor treatment for MRSA infections. Test performance is not FDA approved in patients less than 86 years old. Performed at [facility], [place] [address], [place], [place] [address]   Body fluid culture w Gram Stain     Status: None   Collection Time: 05/23/22 11:04 AM   Specimen: Pleural Fluid  Result Value Ref Range Status   Specimen Description PLEURAL  Final   Special Requests FLUID  Final   Gram Stain   Final    FEW WBC PRESENT, PREDOMINANTLY MONONUCLEAR NO ORGANISMS SEEN    Culture   Final    NO GROWTH 3 DAYS Performed at [facility], [place] [address], [place], [place] [address]    Report Status 05/27/2022 FINAL  Final  MRSA Next Gen by PCR, Nasal     Status: None   Collection Time: 05/26/22  8:07 AM   Specimen: Nasal Mucosa; Nasal Swab  Result Value Ref Range Status   MRSA by PCR Next Gen NOT DETECTED NOT DETECTED Final    Comment: (NOTE) The GeneXpert MRSA Assay (FDA approved for NASAL specimens only), is one component of a comprehensive MRSA colonization surveillance program. It is not intended to diagnose MRSA infection nor to guide or monitor treatment for MRSA infections. Test performance is not FDA approved in patients less than 93 years old. Performed at [facility], [place] [address] [place],  [address]     Radiology Studies: No results found.   Scheduled Meds:  amoxicillin-clavulanate  500 mg Oral Q12H   apixaban  2.5 mg Oral BID   atorvastatin  40 mg Oral QHS   Chlorhexidine Gluconate Cloth  6 each Topical Q0600   [START ON 05/31/2022] Chlorhexidine Gluconate Cloth  6 each Topical Q0600   cyanocobalamin  1,000 mcg Intramuscular Q7 days   insulin aspart  1-3 Units Subcutaneous Q4H   levothyroxine  25 mcg Oral QAC breakfast   midodrine  20 mg Oral  Q8H   pantoprazole  40 mg Oral Daily   rOPINIRole  0.5 mg Oral QHS   sodium chloride flush  10-40 mL Intracatheter Q12H   sodium chloride flush  3 mL Intravenous Q12H   Continuous Infusions:   LOS: 10 days    Time spent: 35 mins  Shawna Clamp, MD [facility]   If 7PM-7AM, please contact night-coverage

## 2022-05-30 NOTE — TOC Initial Note (Signed)
Transition of Care (TOC) - Initial/Assessment Note    Patient Details  Name: Terry Graves. MRN: [id number] Date of Birth: [birth year]  Transition of Care [facility]) CM/SW Contact:    Terry Graves, [place] Phone Number: 05/30/2022, 2:40 PM  Clinical Narrative:                 This social worker spoke to patient's daughter, Terry Graves by phone.  Patient resides with daughter in [place], [place]. Patient currently on Dialysis([facility] on [address] In [place]) Patient's PCP is Terry Graves. He uses [facility] on [address] and has a BCS, walker, cain and hospital bed.  Patient's daughter concerned that patient may be out of SNF days, he has had 2 SNF stays since March of 2023. Bed search started, will review bed offers once received.  Expected Discharge Plan: Skilled Nursing Facility Barriers to Discharge: Continued Medical Work up   Patient Goals and CMS Choice Patient states their goals for this hospitalization and ongoing recovery are:: "to get stronger"      Expected Discharge Plan and Services Expected Discharge Plan: [place] arrangements for the past 2 months: Single Family Home                                      Prior Living Arrangements/Services Living arrangements for the past 2 months: Single Family Home Lives with:: Adult Children          Need for Family Participation in Patient Care: Yes (Comment) Care giver support system in place?: Yes (comment)   Criminal Activity/Legal Involvement Pertinent to Current Situation/Hospitalization: No - Comment as needed  Activities of Daily Living      Permission Sought/Granted                  Emotional Assessment         Alcohol / Substance Use: Not Applicable Psych Involvement: No (comment)  Admission diagnosis:  GI bleed [K92.2] Gastrointestinal hemorrhage, unspecified gastrointestinal hemorrhage type [K92.2] Patient Active Problem List    Diagnosis Date Noted   Grade II internal hemorrhoids    Dieulafoy lesion of colon    Hematochezia    Cecal polyp    Diverticulosis of colon with hemorrhage    AVM (arteriovenous malformation) of small bowel, acquired    Acute on chronic anemia    Anemia due to vitamin B12 deficiency    GI bleed 05/20/2022   Hemorrhagic shock (HCC)    Chronic atrial fibrillation (HCC)    Chronic heart failure with preserved ejection fraction (HCC)    GI bleeding 04/07/2020   CHF (congestive heart failure) ([place])    HOH (hard of hearing)    Hypothyroidism    Sleep apnea    ESRD on hemodialysis ([place])    Hyperglycemia    History of Prepyloric ulcer    Melena 04/25/2019   Acute blood loss anemia 04/25/2019   Heme positive stool    Symptomatic anemia    Gastrointestinal hemorrhage with melena 04/24/2019   Hypertension    Anemia due to stage 5 chronic kidney disease ([place])    Hyperlipidemia    Type 2 diabetes mellitus with stage 5 chronic kidney disease ([place])    PCP:  [name], MD Pharmacy:   [facility] [place], [place] - [address] [place] [address] Phone: [phone] Fax: [phone]  [facility] [place], [place] [address] Phone: [phone] Fax: [phone]     Social Determinants of Health (SDOH) Interventions    Readmission Risk Interventions     No data to display

## 2022-05-30 NOTE — Progress Notes (Addendum)
[facility] Progress Note   Subjective:   Patient seen and examined at bedside. Setting up his breakfast.  Unable to remove fluid with HD yesterday due to hypotension.  Reports breathing improved throughout the day yesterday and was fine overnight with CPAP.  Feeling more short of breath this AM since removing CPAP.  O2 sat 99% on 4L O2 via [place].  Denies CP, abdominal pain and n/v/d.    Objective Vitals:   05/30/22 0548 05/30/22 0838 05/30/22 0920 05/30/22 1000  BP: 137/70 135/72  134/65  Pulse: 83 (!) 108  93  Resp: (!) 23   (!) 24  Temp: 97.9 F (36.6 C) 98.6 F (37 C)  98 F (36.7 C)  TempSrc: Oral Oral  Oral  SpO2: 98%  100% 98%  Weight:      Height:       Physical Exam General:chronically ill appearing male in NAD Heart:IRIR Lungs:+rhonchi, nml WOB on 4L O2 via [place] Abdomen:soft, NTND Extremities:L BKA, R AKA, 1+ edema in hips, +edema in RUE Dialysis Access: LU AVF +b/t   Filed Weights   05/28/22 1255 05/29/22 1455 05/29/22 1824  Weight: 74.3 kg 73.2 kg 72.3 kg    Intake/Output Summary (Last 24 hours) at 05/30/2022 1110 Last data filed at 05/30/2022 0743 Gross per 24 hour  Intake 600 ml  Output 100 ml  Net 500 ml    Additional Objective Labs: Basic Metabolic Panel: Recent Labs  Lab 05/27/22 0006 05/28/22 0148 05/30/22 0229  NA 134* 130* 134*  K 3.7 4.2 3.9  CL 97* 95* 95*  CO2 29 26 28   GLUCOSE 113* 88 138*  BUN 10 14 6*  CREATININE 2.48* 3.27* 2.16*  CALCIUM 7.8* 8.3* 8.5*   Liver Function Tests: Recent Labs  Lab 05/23/22 1139  PROT 4.9*   CBC: Recent Labs  Lab 05/26/22 0120 05/27/22 0006 05/28/22 0148 05/29/22 0119 05/30/22 0229  WBC 8.0 9.2 9.3 8.4 9.7  HGB 9.5* 9.1* 9.6* 10.0* 9.4*  HCT 29.9* 27.9* 29.6* 30.2* 30.3*  MCV 96.5 95.2 96.7 97.1 100.0  PLT 183 162 159 155 151   CBG: Recent Labs  Lab 05/29/22 1200 05/29/22 1946 05/30/22 0011 05/30/22 0546 05/30/22 0806  GLUCAP 166* 127* 167* 114* 119*     Medications:   amoxicillin-clavulanate  500 mg Oral Q12H   apixaban  2.5 mg Oral BID   atorvastatin  40 mg Oral QHS   Chlorhexidine Gluconate Cloth  6 each Topical Q0600   cyanocobalamin  1,000 mcg Intramuscular Q7 days   insulin aspart  1-3 Units Subcutaneous Q4H   levothyroxine  25 mcg Oral QAC breakfast   midodrine  20 mg Oral Q8H   pantoprazole  40 mg Oral Daily   rOPINIRole  0.5 mg Oral QHS   sodium chloride flush  10-40 mL Intracatheter Q12H   sodium chloride flush  3 mL Intravenous Q12H    Dialysis Orders: MWF [facility] (> per Care Everywhere)  3h 31min  400/2.0  73kg  2/2.5 bath  LUA AVG   16ga  Hep ? 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 S/p PRBCs. Hgb stable in 9s. Shock - due to hemorrhage as above, not on pressors at this time Aspiration Pna: s/p vanc/cefepime, transitioned to augmentin.  needs 4-6 weeks ABX.  Per PCCM Pleural effusions/ lung mass -  by CT. Is being f/b doctor in [place] for lung mass. Per CCM. S/p thora 8/13-850cc drained. Location not amendable to chest tube placement by pulm or IR. Palliative consulted. Per PMD. ESRD - on HD MWF in [place]. Extra HD off schedule yesterday for volume removal without success d/t hypotension. Resume regular schedule on Monday.  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 Fe replete on panel. Hgb 9.4--stopped ESA given lung mass and concerning PET scan MBD ckd - CCa in range, phos on low side.  No binders needed. Cont to monitor, replete if needed Atrial fib -restarted on hep gtt HTN/volume - on midodrine. Edema noted in hips, increased SOB.  Unable to UF yesterday d/t hypotension.  Continue UF with HD as able.   Jen Mow, PA-C [facility] 05/30/2022,11:10 AM  LOS: 10 days

## 2022-05-30 NOTE — Progress Notes (Signed)
Pt requested to be placed on cpap for the night, 3L O2 bled in. Pt is tolerating it well at this time.

## 2022-05-31 DIAGNOSIS — J90 Pleural effusion, not elsewhere classified: Secondary | ICD-10-CM | POA: Diagnosis not present

## 2022-05-31 DIAGNOSIS — A419 Sepsis, unspecified organism: Secondary | ICD-10-CM | POA: Diagnosis not present

## 2022-05-31 DIAGNOSIS — R627 Adult failure to thrive: Secondary | ICD-10-CM

## 2022-05-31 DIAGNOSIS — K922 Gastrointestinal hemorrhage, unspecified: Secondary | ICD-10-CM | POA: Diagnosis not present

## 2022-05-31 DIAGNOSIS — D649 Anemia, unspecified: Secondary | ICD-10-CM | POA: Diagnosis not present

## 2022-05-31 DIAGNOSIS — R918 Other nonspecific abnormal finding of lung field: Secondary | ICD-10-CM | POA: Diagnosis not present

## 2022-05-31 LAB — BASIC METABOLIC PANEL
Anion gap: 9 (ref 5–15)
BUN: 15 mg/dL (ref 8–23)
CO2: 27 mmol/L (ref 22–32)
Calcium: 8.3 mg/dL — ABNORMAL LOW (ref 8.9–10.3)
Chloride: 94 mmol/L — ABNORMAL LOW (ref 98–111)
Creatinine, Ser: 3.39 mg/dL — ABNORMAL HIGH (ref 0.61–1.24)
GFR, Estimated: 17 mL/min — ABNORMAL LOW (ref >60)
Glucose, Bld: 109 mg/dL — ABNORMAL HIGH (ref 70–99)
Potassium: 3.7 mmol/L (ref 3.5–5.1)
Sodium: 130 mmol/L — ABNORMAL LOW (ref 135–145)

## 2022-05-31 LAB — CBC
HCT: 30.5 % — ABNORMAL LOW (ref 39.0–52.0)
HCT: 30 % — ABNORMAL LOW (ref 39.0–52.0)
Hemoglobin: 9.5 g/dL — ABNORMAL LOW (ref 13.0–17.0)
Hemoglobin: 9.7 g/dL — ABNORMAL LOW (ref 13.0–17.0)
MCH: 31.1 pg (ref 26.0–34.0)
MCH: 31.6 pg (ref 26.0–34.0)
MCHC: 31.1 g/dL (ref 30.0–36.0)
MCHC: 32.3 g/dL (ref 30.0–36.0)
MCV: 100 fL (ref 80.0–100.0)
MCV: 97.7 fL (ref 80.0–100.0)
Platelets: 154 10*3/uL (ref 150–400)
Platelets: 148 10*3/uL — ABNORMAL LOW (ref 150–400)
RBC: 3.05 MIL/uL — ABNORMAL LOW (ref 4.22–5.81)
RBC: 3.07 MIL/uL — ABNORMAL LOW (ref 4.22–5.81)
RDW: 17.7 % — ABNORMAL HIGH (ref 11.5–15.5)
RDW: 17.7 % — ABNORMAL HIGH (ref 11.5–15.5)
WBC: 9 10*3/uL (ref 4.0–10.5)
WBC: 9.6 10*3/uL (ref 4.0–10.5)
nRBC: 0.2 % (ref 0.0–0.2)
nRBC: 0.4 % — ABNORMAL HIGH (ref 0.0–0.2)

## 2022-05-31 LAB — GLUCOSE, CAPILLARY
Glucose-Capillary: 137 mg/dL — ABNORMAL HIGH (ref 70–99)
Glucose-Capillary: 146 mg/dL — ABNORMAL HIGH (ref 70–99)
Glucose-Capillary: 152 mg/dL — ABNORMAL HIGH (ref 70–99)
Glucose-Capillary: 155 mg/dL — ABNORMAL HIGH (ref 70–99)
Glucose-Capillary: 149 mg/dL — ABNORMAL HIGH (ref 70–99)
Glucose-Capillary: 124 mg/dL — ABNORMAL HIGH (ref 70–99)

## 2022-05-31 LAB — PHOSPHORUS: Phosphorus: 2.3 mg/dL — ABNORMAL LOW (ref 2.5–4.6)

## 2022-05-31 LAB — MAGNESIUM: Magnesium: 1.8 mg/dL (ref 1.7–2.4)

## 2022-05-31 MED ORDER — MIDODRINE HCL 5 MG PO TABS
ORAL_TABLET | ORAL | Status: AC
  Administered 2022-05-31: 10 mg
  Filled 2022-05-31: qty 2

## 2022-05-31 NOTE — Progress Notes (Signed)
Patient ID: Terry Graves., male   DOB: 05/18/1935, 87 y.o.   MRN: [id number]    Progress Note from the Palliative Medicine Team at [facility]   Patient Name: Terry Graves.        Date: 05/31/2022 DOB: 1935-04-02  Age: 87 y.o. MRN#: [id number] Attending Physician: Shawna Clamp, MD Primary Care Physician: [name], MD Admit Date: 05/20/2022   Medical records reviewed   87 y.o. male   admitted on 05/20/2022 with  Patient is a 87 year old male with pertinent PMH of ESRD (dialysis M, W, F), hypothyroidism, GERD, HFpEF, chronic A-fib on Xarelto (off Xarelto since 7/26), OSA on CPAP, recent GI bleed presents to [facility] ED on 8/10 with GI bleed.  Patient was recently admitted to [facility] on 7/26.  Noted to have colonic diverticulitis and small bowel AVM bleed.  Home Xarelto was held.  Patient given PRBC for low hemoglobin.  Patient was discharged about a week ago and to follow-up with GI outpatient.  Since discharge patient has still had persistent bleeding with BM about a cup of blood per day.     Patient presents to [facility] on 8/10 with GI bleed.  Patient mildly hypotensive, otherwise hemodynamically stable.  Afebrile.  On room air.  Initial Hgb 5.9.  Given 2 units PRBCs.     Patient admitted for treatment and stabilization.  Today is day 6 of this hospitalization.   CT chest significant for new occlusive debris present in left bronchus, bilateral pleural effusions, left concerning for empyema, lung nodules.  See PET scan report in hard chart   Patient has family face treatment option decisions, advance directive decisions and anticipatory care needs.    This NP assessed patient at the bedside as a follow up to  yesterday's [place], for palliative medicine needs and emotional support and to meet with 2 daughters/Linda/Wanda as scheduled for continued conversation regarding current medical situation.  Met with Mr. Oberhaus at bedside along with his 2  daughters.  Patient is alert and oriented.  This morning he has had complaints of a feeling that" it is hard to breathe ".   Vital signs stable O2 sats at 99%, no obvious distress.  - discussed use of low dose opioid for dyspnea and patient in agreement-  Dilaudid 1 mg po every 4 hours as needed  - recommendation for guaifenesin   Continue conversation regarding the seriousness and the complexity of patient's current medical situation secondary to likely lung cancer, CHF, end-stage renal disease on dialysis, DM and overall failure to thrive.  Education offered today regarding  the importance of continued conversation with family and their  medical providers regarding overall plan of care and treatment options,  ensuring decisions are within the context of the patients values and GOCs.   Plan of care -DNR/DNI -Patient is open to all offered and available medical interventions to prolong life.  He wishes to continue dialysis -would consider OP oncology work-up if able in the future - when medically stable discharge home to daughter/Linda's home    Patient is high risk for decompensation, he and family  will need ongoing conversation and support dependant on outcomes over the next days to weeks.   Questions and concerns addressed   Discussed with Dr Dwyane Dee  This nurse practitioner informed  the patient/family and the attending that I will be out of the hospital until Sunday  morning.  If the patient is still hospitalized I will follow-up at  that time.  Call palliative medicine team phone # [phone] with questions or concerns in the interim    Wadie Lessen NP  Palliative Medicine Team Team Phone # [phone] Pager [phone]

## 2022-05-31 NOTE — Progress Notes (Signed)
PROGRESS NOTE    [name]  ZYS:[id number] DOB: 1935-03-08 DOA: 05/20/2022 PCP: [name], MD   Brief Narrative:  This 87 years old male with PMH significant for ESRD on dialysis Monday Wednesday and Fridays, hypothyroidism, GERD, HFpEF, chronic A-fib on anticoagulation(off Xarelto since 7/26) OSA on CPAP, recent GI bleed presented in the ED with a GI bleed. Patient recently admitted at [facility] noted to have colonic diverticulitis and small bowel AVM bleed.  Patient was given blood transfusion for low hemoglobin.  Patient was transferred  to [facility] with a GI bleed.  Patient was mildly hypotensive,  initial Hb 5.6,  creatinine close to baseline.  Patient was initially admitted in the ICU.  He underwent colonoscopy, bleeding is stopped with clipping and epi injection.  He is found to have FDG avid lung masses on PET scan.  Palliative care consulted.  Patient moved to DNR/DNI.  Nephrology is consulted for continuation of hemodialysis. PCCM pickup 05/28/2022.  Assessment & Plan:   Principal Problem:   GI bleed Active Problems:   Diverticulosis of colon with hemorrhage   AVM (arteriovenous malformation) of small bowel, acquired   Acute on chronic anemia   Anemia due to vitamin B12 deficiency   Grade II internal hemorrhoids   Dieulafoy lesion of colon   Hematochezia   Cecal polyp  Sepsis secondary to aspiration pneumonia: Loculated parapneumonic effusion Antibiotics changed to Augmentin. Needs 4 to 6 weeks of p.o. antibiotics since pleural space not amenable to drainage. Continue supplemental oxygen to maintain SPO2 above 90% Continue bronchodilators as needed Continue midodrine for hypotension. wean as able.  Acute hypoxic respiratory failure secondary to pneumonia: Continue current antibiotics.  Continue supplemental oxygen now requiring 4 L/min Pulmonary hygiene, wean as able to.  End-stage renal disease on hemodialysis: Continue Hemodialysis as per  nephrology. Patient reports worsening shortness of breath ,  feels he has gained weight. Nephrology completed short cycle of dialysis.  Anemia of chronic disease: H&H remains stable.  Transfuse for hemoglobin less than 7.  Dysphagia: Speech and swallow evaluation recommended MBS which confirms  Dysphagia 3 diet recommended   FDG avid lung mass: Lung biopsy not indicated; family has elected they would not want this Palliative care consulted.  Awaiting recommendation   Hypothyroidism: Continue levothyroxine 25 mcg daily   Deconditioning PT and OT eval.   Restless leg syndrome Continue Requip   Goals of care discussion: Palliative care is meeting with family.     DVT prophylaxis: Eliquis Code Status: DNR Family Communication: No family at bedside Disposition Plan:   Status is: Inpatient Remains inpatient appropriate because: Admitted for GI bleeding which has been resolved.  Now patient has developed parapneumonic effusion lung mass,  family does not want aggressive intervention.  Palliative care is consulted.   Consultants:  PCCM Nephrology ID  Procedures: Colonoscopy   Antimicrobials:  Anti-infectives (From admission, onward)    Start     Dose/Rate Route Frequency Ordered Stop   05/27/22 1900  amoxicillin-clavulanate (AUGMENTIN) 500-125 MG per tablet 500 mg        500 mg Oral Every 12 hours 05/27/22 1045     05/26/22 1200  ceFEPIme (MAXIPIME) 2 g in sodium chloride 0.9 % 100 mL IVPB  Status:  Discontinued        2 g 200 mL/hr over 30 Minutes Intravenous Every M-W-F (Hemodialysis) 05/25/22 1835 05/27/22 1045   05/26/22 1200  vancomycin (VANCOREADY) IVPB 750 mg/150 mL  Status:  Discontinued  750 mg 150 mL/hr over 60 Minutes Intravenous Every M-W-F (Hemodialysis) 05/25/22 1835 05/26/22 1457   05/25/22 1930  ceFEPIme (MAXIPIME) 2 g in sodium chloride 0.9 % 100 mL IVPB        2 g 200 mL/hr over 30 Minutes Intravenous  Once 05/25/22 1835 05/25/22 2244    05/25/22 1930  vancomycin (VANCOREADY) IVPB 1500 mg/300 mL        1,500 mg 150 mL/hr over 120 Minutes Intravenous  Once 05/25/22 1835 05/25/22 2213   05/25/22 1915  metroNIDAZOLE (FLAGYL) IVPB 500 mg  Status:  Discontinued        500 mg 100 mL/hr over 60 Minutes Intravenous Every 12 hours 05/25/22 1829 05/27/22 1045      Subjective: Patient was seen and examined at bedside.  Overnight events noted.   Patient reports feeling same, does not feel any significant improvement. He denies any chest pain, dizziness, palpitation, shortness of breath. He is continued on hemodialysis as per Nephro schedule.   Objective: Vitals:   05/30/22 2030 05/31/22 0144 05/31/22 0801 05/31/22 1120  BP: 92/65  (!) 158/74   Pulse: (!) 108 91 88   Resp: (!) 22 18 19    Temp:   (!) 97.4 F (36.3 C)   TempSrc:      SpO2: 100% 99% 100% 94%  Weight:      Height:        Intake/Output Summary (Last 24 hours) at 05/31/2022 1410 Last data filed at 05/31/2022 1100 Gross per 24 hour  Intake 120 ml  Output 1 ml  Net 119 ml   Filed Weights   05/28/22 1255 05/29/22 1455 05/29/22 1824  Weight: 74.3 kg 73.2 kg 72.3 kg    Examination:  General exam: Appears weak, frail, deconditioned, sick looking, not in any distress. Respiratory system: CTA bilaterally, respiratory effort normal, RR 16 Cardiovascular system: S1 & S2 heard, regular rate and rhythm, no murmur.   Gastrointestinal system: Abdomen is soft, non tender, non distended, BS+. Extremities : Bilateral BKA. Skin: No rashes, lesions or ulcers Psychiatry: Judgement and insight appear normal. Mood & affect appropriate.     Data Reviewed: I have personally reviewed following labs and imaging studies  CBC: Recent Labs  Lab 05/27/22 0006 05/28/22 0148 05/29/22 0119 05/30/22 0229 05/31/22 0425  WBC 9.2 9.3 8.4 9.7 9.0  HGB 9.1* 9.6* 10.0* 9.4* 9.5*  HCT 27.9* 29.6* 30.2* 30.3* 30.5*  MCV 95.2 96.7 97.1 100.0 100.0  PLT 162 159 155 151 161    Basic Metabolic Panel: Recent Labs  Lab 05/25/22 0059 05/26/22 0120 05/27/22 0006 05/28/22 0148 05/30/22 0229  NA 133* 131* 134* 130* 134*  K 3.3* 3.9 3.7 4.2 3.9  CL 96* 95* 97* 95* 95*  CO2 29 24 29 26 28   GLUCOSE 115* 216* 113* 88 138*  BUN 10 18 10 14  6*  CREATININE 2.46* 3.47* 2.48* 3.27* 2.16*  CALCIUM 7.6* 7.9* 7.8* 8.3* 8.5*  MG 1.5*  --  1.7  --   --    GFR: Estimated Creatinine Clearance: 25 mL/min (A) (by C-G formula based on SCr of 2.16 mg/dL (H)). Liver Function Tests: No results for input(s): "AST", "ALT", "ALKPHOS", "BILITOT", "PROT", "ALBUMIN" in the last 168 hours.  No results for input(s): "LIPASE", "AMYLASE" in the last 168 hours. No results for input(s): "AMMONIA" in the last 168 hours. Coagulation Profile: No results for input(s): "INR", "PROTIME" in the last 168 hours.  Cardiac Enzymes: No results for input(s): "CKTOTAL", "CKMB", "CKMBINDEX", "TROPONINI"  in the last 168 hours. BNP (last 3 results) No results for input(s): "PROBNP" in the last 8760 hours. HbA1C: No results for input(s): "HGBA1C" in the last 72 hours. CBG: Recent Labs  Lab 05/30/22 2307 05/31/22 0445 05/31/22 0813 05/31/22 1226 05/31/22 1231  GLUCAP 167* 137* 146* 152* 155*   Lipid Profile: No results for input(s): "CHOL", "HDL", "LDLCALC", "TRIG", "CHOLHDL", "LDLDIRECT" in the last 72 hours. Thyroid Function Tests: No results for input(s): "TSH", "T4TOTAL", "FREET4", "T3FREE", "THYROIDAB" in the last 72 hours. Anemia Panel: No results for input(s): "VITAMINB12", "FOLATE", "FERRITIN", "TIBC", "IRON", "RETICCTPCT" in the last 72 hours. Sepsis Labs: No results for input(s): "PROCALCITON", "LATICACIDVEN" in the last 168 hours.   Recent Results (from the past 240 hour(s))  Body fluid culture w Gram Stain     Status: None   Collection Time: 05/23/22 11:04 AM   Specimen: Pleural Fluid  Result Value Ref Range Status   Specimen Description PLEURAL  Final   Special Requests  FLUID  Final   Gram Stain   Final    FEW WBC PRESENT, PREDOMINANTLY MONONUCLEAR NO ORGANISMS SEEN    Culture   Final    NO GROWTH 3 DAYS Performed at [facility], [address], [place], [place] [address]    Report Status 05/27/2022 FINAL  Final  MRSA Next Gen by PCR, Nasal     Status: None   Collection Time: 05/26/22  8:07 AM   Specimen: Nasal Mucosa; Nasal Swab  Result Value Ref Range Status   MRSA by PCR Next Gen NOT DETECTED NOT DETECTED Final    Comment: (NOTE) The GeneXpert MRSA Assay (FDA approved for NASAL specimens only), is one component of a comprehensive MRSA colonization surveillance program. It is not intended to diagnose MRSA infection nor to guide or monitor treatment for MRSA infections. Test performance is not FDA approved in patients less than 27 years old. Performed at [facility], [place] [address], [place], [place] [address]     Radiology Studies: No results found.   Scheduled Meds:  amoxicillin-clavulanate  500 mg Oral Q12H   apixaban  2.5 mg Oral BID   atorvastatin  40 mg Oral QHS   Chlorhexidine Gluconate Cloth  6 each Topical Q0600   Chlorhexidine Gluconate Cloth  6 each Topical Q0600   cyanocobalamin  1,000 mcg Intramuscular Q7 days   insulin aspart  1-3 Units Subcutaneous Q4H   levothyroxine  25 mcg Oral QAC breakfast   midodrine  20 mg Oral Q8H   pantoprazole  40 mg Oral Daily   rOPINIRole  0.5 mg Oral QHS   sodium chloride flush  10-40 mL Intracatheter Q12H   sodium chloride flush  3 mL Intravenous Q12H   Continuous Infusions:   LOS: 11 days    Time spent: 35 mins    Gregary Blackard, MD [facility]   If 7PM-7AM, please contact night-coverage

## 2022-05-31 NOTE — Progress Notes (Signed)
Contacted by CSW that pt will d/c to snf tomorrow. Contacted [facility] and spoke to [place]. Clinic aware pt will d/c tomorrow and resume care on Wednesday. Pt will need to arrive at 12:00-12:15 for 12:30 chair time. This information was passed along to CSW to advise snf. Will fax d/c summary to clinic tomorrow once available.   Melven Sartorius Renal Navigator [phone]

## 2022-05-31 NOTE — Progress Notes (Signed)
[facility] Progress Note   Subjective:   Seen in room, eating breakfast. O2 saturation is 100% but he feels more SOB, says he felt better on CPAP. Denies CP, dizziness, nausea and vomiting.   Objective Vitals:   05/30/22 1831 05/30/22 2030 05/31/22 0144 05/31/22 0801  BP:  92/65  (!) 158/74  Pulse: 97 (!) 108 91 88  Resp: 20 (!) 22 18 19   Temp:    (!) 97.4 F (36.3 C)  TempSrc:      SpO2: 98% 100% 99% 100%  Weight:      Height:       Physical Exam General: Elderly male, alert and in NAD Heart: irregular rate and rhythm, no murmurs auscultated Lungs: + rhonchi b/l lung bases, respirations unlabored on 4L via [place] Abdomen: Soft, non-distended, +BS Extremities: L BKA, R AKA. 1+ dependent edema Dialysis Access:  LUE AVF + bruit  Additional Objective Labs: Basic Metabolic Panel: Recent Labs  Lab 05/27/22 0006 05/28/22 0148 05/30/22 0229  NA 134* 130* 134*  K 3.7 4.2 3.9  CL 97* 95* 95*  CO2 29 26 28   GLUCOSE 113* 88 138*  BUN 10 14 6*  CREATININE 2.48* 3.27* 2.16*  CALCIUM 7.8* 8.3* 8.5*   Liver Function Tests: No results for input(s): "AST", "ALT", "ALKPHOS", "BILITOT", "PROT", "ALBUMIN" in the last 168 hours. No results for input(s): "LIPASE", "AMYLASE" in the last 168 hours. CBC: Recent Labs  Lab 05/27/22 0006 05/28/22 0148 05/29/22 0119 05/30/22 0229 05/31/22 0425  WBC 9.2 9.3 8.4 9.7 9.0  HGB 9.1* 9.6* 10.0* 9.4* 9.5*  HCT 27.9* 29.6* 30.2* 30.3* 30.5*  MCV 95.2 96.7 97.1 100.0 100.0  PLT 162 159 155 151 154   Blood Culture    Component Value Date/Time   SDES PLEURAL 05/23/2022 1104   SPECREQUEST FLUID 05/23/2022 1104   CULT  05/23/2022 1104    NO GROWTH 3 DAYS Performed at [facility], [place] [address], [place], [place] [address]    REPTSTATUS 05/27/2022 FINAL 05/23/2022 1104    Cardiac Enzymes: No results for input(s): "CKTOTAL", "CKMB", "CKMBINDEX", "TROPONINI" in the last 168 hours. CBG: Recent Labs  Lab  05/30/22 1621 05/30/22 1733 05/30/22 2307 05/31/22 0445 05/31/22 0813  GLUCAP 210* 206* 167* 137* 146*   Iron Studies: No results for input(s): "IRON", "TIBC", "TRANSFERRIN", "FERRITIN" in the last 72 hours. @lablastinr3 @ Studies/Results: No results found. Medications:   amoxicillin-clavulanate  500 mg Oral Q12H   apixaban  2.5 mg Oral BID   atorvastatin  40 mg Oral QHS   Chlorhexidine Gluconate Cloth  6 each Topical Q0600   Chlorhexidine Gluconate Cloth  6 each Topical Q0600   cyanocobalamin  1,000 mcg Intramuscular Q7 days   insulin aspart  1-3 Units Subcutaneous Q4H   levothyroxine  25 mcg Oral QAC breakfast   midodrine  20 mg Oral Q8H   pantoprazole  40 mg Oral Daily   rOPINIRole  0.5 mg Oral QHS   sodium chloride flush  10-40 mL Intracatheter Q12H   sodium chloride flush  3 mL Intravenous Q12H    Dialysis Orders: MWF [facility] (> per Care Everywhere)  3h 26min  400/2.0  73kg  2/2.5 bath  LUA AVG   16ga  Hep ? 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 S/p PRBCs. Hgb stable in 9s. 2. Shock -  due to hemorrhage as above, not on pressors at this time 3. Aspiration Pna: s/p vanc/cefepime, transitioned to augmentin.  needs 4-6 weeks ABX.  Per PCCM Pleural effusions/ lung mass - by CT. Is being f/b doctor in [place] for lung mass. Per CCM. S/p thora 8/13-850cc drained. Location not amendable to chest tube placement by pulm or IR. Palliative consulted. Per PMD. ESRD - on HD MWF in [place]. Extra HD off schedule Saturday for volume removal without success d/t hypotension. Resume regular schedule on Monday.  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 Fe replete on panel. Hgb 9.4--stopped ESA given lung mass and concerning PET scan 6. MBD ckd - CCa in range, phos on low side.  No binders needed. Cont to monitor, replete if needed 7. Atrial fib -restarted  on hep gtt 8. HTN/volume - on midodrine. Edema noted in hips, increased SOB.  Unable to UF last HD d/t hypotension.  Continue UF with HD as able. Albumin and extra midodrine ordered for BP support.   Anice Paganini, PA-C 05/31/2022, 9:17 AM  [facility] Pager: [phone]

## 2022-05-31 NOTE — TOC Progression Note (Addendum)
Transition of Care (TOC) - Progression Note    Patient Details  Name: Terry Graves. MRN: [id number] Date of Birth: June 27, 1935  Transition of Care [facility]) CM/SW Contact  Joanne Chars, LCSW Phone Number: 05/31/2022, 1:42 PM  Clinical Narrative:   CSW spoke with pt and with daughter Vaughan Basta (on speakerphone) regarding bed offers.  Vaughan Basta had been in touch with [facility] and would like pt to go there, pt is agreeable to this.  Confirmed with [facility].  They are aware of HD.  Vaughan Basta reports that [facility] checked bed days and pt has been out of hospital since early May, SNF days have reset.    Tracy/Renal notified.     Expected Discharge Plan: [place] Barriers to Discharge: Continued Medical Work up  Expected Discharge Plan and Services Expected Discharge Plan: [place] arrangements for the past 2 months: Single Family Home                                       Social Determinants of Health (SDOH) Interventions    Readmission Risk Interventions     No data to display

## 2022-05-31 NOTE — Progress Notes (Signed)
Patient c/o SOB, chest tightness, requested to be on CPAP.  Placed patient on CPAP, called RT for assistance.  Lung sound rhonchus bilaterally with expiratory wheezing.  Breathing tx given by RT, CPAP secured back in place.  No respiratory distress at this time, needs addressed.  Dr. Madaline Brilliant made aware.

## 2022-05-31 NOTE — Progress Notes (Signed)
   05/30/22 2030  Assess: MEWS Score  BP 92/65  MAP (mmHg) 71  Pulse Rate (!) 108  ECG Heart Rate (!) 109  Resp (!) 22  Level of Consciousness Alert  SpO2 100 %  Assess: MEWS Score  MEWS Temp 0  MEWS Systolic 1  MEWS Pulse 1  MEWS RR 1  MEWS LOC 0  MEWS Score 3  MEWS Score Color Yellow  Assess: if the MEWS score is Yellow or Red  Were vital signs taken at a resting state? Yes  Focused Assessment No change from prior assessment  Does the patient meet 2 or more of the SIRS criteria? No  MEWS guidelines implemented *See Row Information* No, previously yellow, continue vital signs every 4 hours  Treat  Pain Scale 0-10  Pain Score 0  Notify: Charge Nurse/RN  Name of Charge Nurse/RN Notified Monterrius Cardosa  Date Charge Nurse/RN Notified 05/30/22  Time Charge Nurse/RN Notified 2030  Document  Patient Outcome Stabilized after interventions  Progress note created (see row info) Yes  Assess: SIRS CRITERIA  SIRS Temperature  0  SIRS Pulse 1  SIRS Respirations  1  SIRS WBC 1  SIRS Score Sum  3

## 2022-05-31 NOTE — Evaluation (Signed)
Occupational Therapy Evaluation Patient Details Name: Terry Graves. MRN: [id number] DOB: 08-Apr-1935 Today's Date: 05/31/2022   History of Present Illness Pt is a 87 y.o. M who presents with  GIB, sepsis secondary to aspiration PNA 05/20/2022. Significant PMH: ESRD on HD MWF, HFpEF, chronic A-fib, recent GIB, L BKA, R AKA.   Clinical Impression   PTA pt was living with daughter, performing slide board transfers to his electric scooter and using transportation to dialysis. Pt today presents with decreased cardiorespiratory endurance, strength, and activity tolerance. Pt participation limited by shortness of breath, however O2 remains >97% on 4L O2 via [place]. Pt requesting to switch to CPAP. Pt is able to roll multiple times with MIN A for placement of bed pan and linen change. Pt would benefit from SNF placement to continue to progress to PLOF.      Recommendations for follow up therapy are one component of a multi-disciplinary discharge planning process, led by the attending physician.  Recommendations may be updated based on patient status, additional functional criteria and insurance authorization.   Follow Up Recommendations  Skilled nursing-short term rehab (<3 hours/day)    Assistance Recommended at Discharge Frequent or constant Supervision/Assistance  Patient can return home with the following A lot of help with bathing/dressing/bathroom;Assistance with cooking/housework;Direct supervision/assist for medications management;Direct supervision/assist for financial management;Help with stairs or ramp for entrance    Functional Status Assessment  Patient has had a recent decline in their functional status and demonstrates the ability to make significant improvements in function in a reasonable and predictable amount of time.  Equipment Recommendations  Other (comment) (TBD)       Precautions / Restrictions Precautions Precautions: Fall;Other (comment) Precaution Comments: L BKA, R  AKA Restrictions Weight Bearing Restrictions: No RLE Weight Bearing: Non weight bearing LLE Weight Bearing: Non weight bearing      Mobility Bed Mobility   Bed Mobility: Rolling Rolling: Min assist                   ADL either performed or assessed with clinical judgement   ADL Overall ADL's : Needs assistance/impaired Eating/Feeding: Minimal assistance;Bed level   Grooming: Wash/dry face;Supervision/safety;Bed level   Upper Body Bathing: Bed level;Minimal assistance   Lower Body Bathing: Bed level;Maximal assistance   Upper Body Dressing : Moderate assistance;Bed level   Lower Body Dressing: Maximal assistance;Bed level       Toileting- Clothing Manipulation and Hygiene: Maximal assistance;Bed level               Vision Baseline Vision/History: 1 Wears glasses Ability to See in Adequate Light: 0 Adequate Patient Visual Report: No change from baseline Vision Assessment?: No apparent visual deficits            Pertinent Vitals/Pain Pain Assessment Pain Assessment: No/denies pain     Hand Dominance Right   Extremity/Trunk Assessment Upper Extremity Assessment Upper Extremity Assessment: Generalized weakness   Lower Extremity Assessment RLE Deficits / Details: prior AKA LLE Deficits / Details: prior BKA, tendency to rest in ER, able to reach neutral extension       Communication Communication Communication: No difficulties   Cognition Arousal/Alertness: Awake/alert Behavior During Therapy: WFL for tasks assessed/performed Overall Cognitive Status: No family/caregiver present to determine baseline cognitive functioning                                 General Comments: Pt reporting needing A at  home for certain tasks, able to follow 1 step commands consistently                Home Living Family/patient expects to be discharged to:: Skilled nursing facility Living Arrangements: Children   Type of Home: House Home  Access: Ramped entrance     [place]: [place]: Tub bench;Electric scooter;Other (comment);Transport chair          Prior Functioning/Environment Prior Level of Function : Needs assist       Physical Assist : ADLs (physical);Mobility (physical) Mobility (physical): Transfers ADLs (physical): Bathing;Toileting Mobility Comments: slide board transfers to electric scooter with intermittent assist ADLs Comments: Pt reporting needing A for bathing "the back of my body" and uses a brief/bedpan for toileting        OT Problem List: Decreased strength;Decreased activity tolerance;Impaired balance (sitting and/or standing);Cardiopulmonary status limiting activity      OT Treatment/Interventions: Self-care/ADL training;DME and/or AE instruction;Therapeutic activities;Patient/family education;Balance training    OT Goals(Current goals can be found in the care plan section) Acute Rehab OT Goals Patient Stated Goal: "I want to get on the bed pan" Time For Goal Achievement: 06/14/22 Potential to Achieve Goals: Good  OT Frequency: Min 2X/week       AM-PAC OT "6 Clicks" Daily Activity     Outcome Measure Help from another person eating meals?: A Little Help from another person taking care of personal grooming?: A Little Help from another person toileting, which includes using toliet, bedpan, or urinal?: A Lot Help from another person bathing (including washing, rinsing, drying)?: A Lot Help from another person to put on and taking off regular upper body clothing?: A Lot Help from another person to put on and taking off regular lower body clothing?: A Lot 6 Click Score: 14   End of Session    Activity Tolerance: Patient limited by fatigue;Other (comment) (limited by SOB) Patient left: in bed;with call bell/phone within reach;with bed alarm set  OT Visit Diagnosis: Other abnormalities of gait and mobility (R26.89);Muscle weakness (generalized)  (M62.81)                Time: 7616-0737 OT Time Calculation (min): 22 min Charges:  OT General Charges $OT Visit: 1 Visit OT Evaluation $OT Eval Moderate Complexity: 1 Mod  [address] MOTR/L  Tonny Branch 05/31/2022, 11:08 AM

## 2022-06-01 DIAGNOSIS — K922 Gastrointestinal hemorrhage, unspecified: Secondary | ICD-10-CM | POA: Diagnosis not present

## 2022-06-01 LAB — GLUCOSE, CAPILLARY
Glucose-Capillary: 88 mg/dL (ref 70–99)
Glucose-Capillary: 99 mg/dL (ref 70–99)
Glucose-Capillary: 90 mg/dL (ref 70–99)
Glucose-Capillary: 131 mg/dL — ABNORMAL HIGH (ref 70–99)
Glucose-Capillary: 190 mg/dL — ABNORMAL HIGH (ref 70–99)

## 2022-06-01 MED ORDER — AMOXICILLIN-POT CLAVULANATE 500-125 MG PO TABS: 500.0000 mg | ORAL_TABLET | Freq: Two times a day (BID) | ORAL | 0 refills | Status: AC

## 2022-06-01 MED ORDER — APIXABAN 2.5 MG PO TABS: 2.5000 mg | ORAL_TABLET | Freq: Two times a day (BID) | ORAL | 2 refills | Status: AC

## 2022-06-01 MED ORDER — BENZONATATE 100 MG PO CAPS: 100.0000 mg | ORAL_CAPSULE | Freq: Three times a day (TID) | ORAL | 0 refills | Status: AC | PRN

## 2022-06-01 MED ORDER — MIDODRINE HCL 10 MG PO TABS: 20.0000 mg | ORAL_TABLET | Freq: Three times a day (TID) | ORAL | 0 refills | Status: AC

## 2022-06-01 NOTE — Discharge Summary (Signed)
Physician Discharge Summary  [name] ZOX:[id number] DOB: 1935/08/17 DOA: 05/20/2022  PCP: [name], MD  Admit date: 05/20/2022  Discharge date: 06/01/2022  Admitted From: Home.  Disposition:  SNF  Recommendations for Outpatient Follow-up:  Follow up with PCP in 1-2 weeks. Please obtain BMP/CBC in one week. Advised to follow-up with hemodialysis unit for continuation of hemodialysis as scheduled. Advised to take Augmentin 875 twice daily for 6 weeks. Advised to take Eliquis 2.5 mg twice daily.  Home Health: None Equipment/Devices: Home oxygen @ 2L/M  Discharge Condition: Stable CODE STATUS: DNR Diet recommendation: Heart Healthy  Brief Summary/ Hospital Course: This 87 years old male with PMH significant for ESRD on dialysis Monday Wednesday and Fridays, hypothyroidism, GERD, HFpEF, chronic A-fib on anticoagulation (off Xarelto since 7/26) OSA on CPAP, recent GI bleed presented in the ED with a GI bleed. Patient was recently admitted at [facility] , noted to have colonic diverticulitis and small bowel AVM bleed.  Patient was transferred  to [facility] with a GI bleed.  Patient was mildly hypotensive, Initial Hb 5.6,  creatinine close to baseline.  Patient was initially admitted in the ICU.  He underwent colonoscopy, bleeding was stopped with clipping and epi injection.  He is found to have FDG avid lung masses on PET scan.  Palliative care consulted.  Patient code status changed to DNR/DNI. Nephrology is consulted for continuation of hemodialysis.  Patient was found to have loculated parapneumonic effusion continued on antibiotics.  Pulmonologist recommended 4 to 6 weeks of p.o. antibiotics since pleural space not amenable to drainage.  Patient continued on 2 L of supplemental oxygen. He was also continued on midodrine for hypotension.  Patient feels better and wants to be discharged, patient is being discharged to a skilled nursing facility for  rehabilitation.   Discharge Diagnoses:  Principal Problem:   GI bleed Active Problems:   Diverticulosis of colon with hemorrhage   AVM (arteriovenous malformation) of small bowel, acquired   Acute on chronic anemia   Anemia due to vitamin B12 deficiency   Grade II internal hemorrhoids   Dieulafoy lesion of colon   Hematochezia   Cecal polyp  Sepsis secondary to aspiration pneumonia: Loculated parapneumonic effusion Antibiotics changed to Augmentin. Needs 4 to 6 weeks of p.o. antibiotics since pleural space not amenable to drainage. Continue supplemental oxygen to maintain SPO2 above 90% Continue bronchodilators as needed Continue midodrine for hypotension. wean as able.   Acute hypoxic respiratory failure secondary to pneumonia: Continue current antibiotics.  Continue supplemental oxygen now requiring 4 L/min Pulmonary hygiene, wean as able to.   End-stage renal disease on hemodialysis: Continue Hemodialysis as per nephrology.  Anemia of chronic disease: H&H remains stable.  Transfuse for hemoglobin less than 7.   Dysphagia: Speech and swallow evaluation recommended MBS which confirms  Dysphagia 3 diet recommended   FDG avid lung mass: Lung biopsy not indicated; family has elected they would not want this Palliative care consulted.  Awaiting recommendation   Hypothyroidism: Continue levothyroxine 25 mcg daily   Deconditioning PT and OT eval.   Restless leg syndrome Continue Requip   Goals of care discussion: Palliative care is meeting with family  Discharge Instructions  Discharge Instructions     Call MD for:  difficulty breathing, headache or visual disturbances   Complete by: As directed    Call MD for:  persistant dizziness or light-headedness   Complete by: As directed    Call MD for:  severe uncontrolled pain  Complete by: As directed    Diet - low sodium heart healthy   Complete by: As directed    Diet Carb Modified   Complete by: As  directed    Discharge instructions   Complete by: As directed    Advised to follow-up with primary care physician in 1 week. Advised to follow-up with the hemodialysis unit for continuation of hemodialysis as scheduled. Advised to take Augmentin 875 twice daily for 6 weeks. Advised to take Eliquis 2.5 mg twice daily.   Discharge wound care:   Complete by: As directed    Follow-up with SNF.   Increase activity slowly   Complete by: As directed       Allergies as of 06/01/2022       Reactions   Quinine    UNSPECIFIED REACTION         Medication List     STOP taking these medications    Xarelto 15 MG Tabs tablet Generic drug: Rivaroxaban       TAKE these medications    amoxicillin-clavulanate 500-125 MG tablet Commonly known as: AUGMENTIN Take 1 tablet (500 mg total) by mouth every 12 (twelve) hours.   apixaban 2.5 MG Tabs tablet Commonly known as: ELIQUIS Take 1 tablet (2.5 mg total) by mouth 2 (two) times daily.   atorvastatin 40 MG tablet Commonly known as: LIPITOR Take 40 mg by mouth at bedtime.   benzonatate 100 MG capsule Commonly known as: TESSALON Take 1 capsule (100 mg total) by mouth 3 (three) times daily as needed for cough.   carvedilol 3.125 MG tablet Commonly known as: COREG Take 3.125 mg by mouth 2 (two) times daily with a meal.   DAILY VITE PO Take 1 tablet by mouth every evening.   levothyroxine 25 MCG tablet Commonly known as: SYNTHROID Take 25 mcg by mouth daily before breakfast.   midodrine 10 MG tablet Commonly known as: PROAMATINE Take 2 tablets (20 mg total) by mouth every 8 (eight) hours.   MIRCERA IJ Inject as directed as needed (Low hemoglobin).   pantoprazole 40 MG tablet Commonly known as: PROTONIX Take 40 mg by mouth at bedtime.   rOPINIRole 0.5 MG tablet Commonly known as: REQUIP Take 0.5 mg by mouth at bedtime.   terazosin 1 MG capsule Commonly known as: HYTRIN Take 1 mg by mouth at bedtime.   Vitamin D3  125 MCG (5000 UT) Caps Take 5,000 Units by mouth every evening.               Discharge Care Instructions  (From admission, onward)           Start     Ordered   06/01/22 0000  Discharge wound care:       Comments: Follow-up with SNF.   06/01/22 1016            Contact information for follow-up providers     [name], MD Follow up in 1 week(s).   Specialty: Nurse Practitioner Contact information: [address] [place] [address] [phone]              Contact information for after-discharge care     [facility] .   Service: Skilled Nursing Contact information: [place] [address] [phone]                    Allergies  Allergen Reactions   Quinine  UNSPECIFIED REACTION     Consultations: PCCM GI Nephro   Procedures/Studies: DG Swallowing Func-Speech Pathology  Result Date: 05/27/2022 Table formatting from the original result was not included. Objective Swallowing Evaluation: Type of Study: MBS-Modified Barium Swallow Study  Patient Details Name: Oluwafemi Villella. MRN: [id number] Date of Birth: October 17, 1934 Today's Date: 05/27/2022 Time: SLP Start Time (ACUTE ONLY): 30 -SLP Stop Time (ACUTE ONLY): 1425 SLP Time Calculation (min) (ACUTE ONLY): 15 min Past Medical History: Past Medical History: Diagnosis Date  Anemia   Arthritis   Atrial fibrillation (HCC)   CHF (congestive heart failure) (HCC)   Chronic kidney disease-mineral and bone disorder   ESRD on hemodialysis ([place])   takes HD in [place] on MWF schedule  Headache   HOH (hard of hearing)   Hyperlipidemia   Hypertension   Hypothyroidism   Peripheral vascular disease (HCC)   Proteinuria   Sleep apnea   wears CPAP 12  Type 2 diabetes mellitus with stage 5 chronic kidney disease ([place])   Wears glasses  Past Surgical History: Past Surgical History: Procedure Laterality Date  A/V FISTULAGRAM N/A  03/14/2020  Procedure: A/V FISTULAGRAM - left arm;  Surgeon: Elam Dutch, MD;  Location: [place] CV LAB;  Service: Cardiovascular;  Laterality: N/A;  ABDOMINAL AORTOGRAM W/LOWER EXTREMITY Bilateral 05/23/2020  Procedure: ABDOMINAL AORTOGRAM W/LOWER EXTREMITY;  Surgeon: Elam Dutch, MD;  Location: [place] CV LAB;  Service: Cardiovascular;  Laterality: Bilateral;  Bilateral   AV FISTULA PLACEMENT Left 01/01/2020  Procedure: LEFT ARM ARTERIOVENOUS (AV) FISTULA CREATION;  Surgeon: Angelia Mould, MD;  Location: [facility] OR;  Service: Vascular;  Laterality: Left;  CATARACT EXTRACTION W/ INTRAOCULAR LENS  IMPLANT, BILATERAL    COLONOSCOPY    COLONOSCOPY N/A 04/09/2020  Procedure: COLONOSCOPY;  Surgeon: [name], MD;  [facility];  Service: Endoscopy;  Laterality: N/A;  COLONOSCOPY Left 05/22/2022  Procedure: COLONOSCOPY;  Surgeon: Lavena Bullion, DO;  Location: [place];  Service: Gastroenterology;  Laterality: Left;  ESOPHAGOGASTRODUODENOSCOPY N/A 04/25/2019  Procedure: ESOPHAGOGASTRODUODENOSCOPY (EGD);  Surgeon: [name], MD;  [facility];  Service: Endoscopy;  Laterality: N/A;  ESOPHAGOGASTRODUODENOSCOPY N/A 04/08/2020  Procedure: ESOPHAGOGASTRODUODENOSCOPY (EGD);  Surgeon: [name], MD;  [facility];  Service: Endoscopy;  Laterality: N/A;  GIVENS CAPSULE STUDY N/A 04/16/2020  Procedure: GIVENS CAPSULE STUDY;  Surgeon: [name], MD;  [facility];  Service: Endoscopy;  Laterality: N/A;  [place]  05/22/2022  Procedure: HEMOSTASIS CLIP PLACEMENT;  Surgeon: Lavena Bullion, DO;  Location: [place] [facility];  Service: Gastroenterology;;  HIP ARTHROSCOPY Left   INSERTION OF DIALYSIS CATHETER Right 01/01/2020  Procedure: INSERTION OF RIGHT INTERNAL JUGULAR DIALYSIS CATHETER;  Surgeon: Angelia Mould, MD;  Location: [place];  Service: Vascular;  Laterality: Right;  LIGATION OF COMPETING BRANCHES OF  ARTERIOVENOUS FISTULA Left 03/28/2020  Procedure: SIDE BRANCH LIGATION TIMES THREE OF LEFT ARM  ARTERIOVENOUS FISTULA;  Surgeon: Marty Heck, MD;  Location: [place];  Service: Vascular;  Laterality: Left;  POLYPECTOMY  05/22/2022  Procedure: POLYPECTOMY;  Surgeon: Lavena Bullion, DO;  Location: [place];  Service: Gastroenterology;;  Clide Deutscher  05/22/2022  Procedure: Clide Deutscher;  Surgeon: Lavena Bullion, DO;  Location: [facility];  Service: Gastroenterology;; HPI: Patient is an 87 y.o. male with PMH: ESRD on HD, h/o lung masses, GERD, OSA on CPAP, ,recent GI bleed who presented to [facility] ED on 8/10 with GI bleed. He was mildly  hypotensive but otherwise hemodynamically stable, afebrile on RA, initial Hgb 5.9 and given 2 units PRBCs. RN who had patient this morning reported he had been having some coughing while eating meals.  Subjective: pleasant, stating that his back hurts  Recommendations for follow up therapy are one component of a multi-disciplinary discharge planning process, led by the attending physician.  Recommendations may be updated based on patient status, additional functional criteria and insurance authorization. Assessment / Plan / Recommendation   05/27/2022   3:13 PM Clinical Impressions Clinical Impression Patient presents with a mild oral phase dysphagia and a pharyngeal phase of swallow that appears [facility]. He exhibited brief delays with anterior to posterior transit with solids but no oral residuals and good bolus cohesion. Swallow initiation was timely for all tested consistencies (thin and nectar thick liquids, puree solids, 3mm barium tablet and regular solids). No penetration or aspiration observed with liquids or solids. When swallowing barium tablet with thin liquid barium, patient had difficulty keeping a cohesive bolus and benefited from sips of nectar thick liquids to help transit. Tablet became briefly lodged at level of UES but otherwise transited without  difficulty. Esophageal sweep revealed retrograde movement of thin liquid barium in distal esophagus as well as barium tablet briefly stuck in distal portion of esophagus. He had frequent belching during the MBS and reported to SLP that he has been coughing up a log of phlegm today. SLP is recommending continue on Dys 3 solids, thin liquids. SLP will plan to f/u at least one more time to ensure toleration but suspect that his symptoms are occuring at the esophageal phase.     05/27/2022   3:11 PM Treatment Recommendations Treatment Recommendations Therapy as outlined in treatment plan below     05/27/2022   3:18 PM Prognosis Prognosis for Safe Diet Advancement Good Barriers to Reach Goals Time post onset   05/27/2022   3:11 PM Diet Recommendations SLP Diet Recommendations Regular solids;Dysphagia 3 (Mech soft) solids;Thin liquid Liquid Administration via Cup;Straw Medication Administration Whole meds with puree Compensations Slow rate;Small sips/bites Postural Changes Seated upright at 90 degrees;Remain semi-upright after after feeds/meals (Comment)     05/27/2022   3:11 PM Other Recommendations Oral Care Recommendations Oral care BID Follow Up Recommendations No SLP follow up Assistance recommended at discharge Intermittent Supervision/Assistance Functional Status Assessment Patient has had a recent decline in their functional status and demonstrates the ability to make significant improvements in function in a reasonable and predictable amount of time.   05/27/2022   3:11 PM Frequency and Duration  Speech Therapy Frequency (ACUTE ONLY) min 1 x/week Treatment Duration 1 week     05/27/2022   3:10 PM Oral Phase Oral Phase Impaired Oral - Nectar Cup WFL Oral - Thin Cup WFL Oral - Thin Straw WFL Oral - Puree Reduced posterior propulsion Oral - Mech Soft Reduced posterior propulsion Oral - Pill Decreased bolus cohesion;Reduced posterior propulsion    05/27/2022   3:10 PM Pharyngeal Phase Pharyngeal Phase Impaired Pharyngeal-  Nectar Cup [facility] Pharyngeal- Thin Cup WFL Pharyngeal- Thin Straw WFL Pharyngeal- Puree WFL Pharyngeal- Mechanical Soft WFL Pharyngeal- Pill Reduced pharyngeal peristalsis;Pharyngeal residue - cp segment    05/27/2022   3:11 PM Cervical Esophageal Phase  Cervical Esophageal Phase [facility] Sonia Baller, MA, CCC-SLP Speech Therapy                     DG CHEST PORT 1 VIEW  Result Date: 05/26/2022 CLINICAL DATA:  Status post central line  placement EXAM: PORTABLE CHEST 1 VIEW COMPARISON:  05/23/2022, CT from 05/25/2022 FINDINGS: Cardiac shadow is stable. Aortic calcifications are noted. New left jugular central line is noted with the tip at the proximal superior vena cava. Right lung is clear. Increasing effusion and consolidation is noted on the left consistent with the occlusive debris seen on prior CT examination. No acute bony abnormality is noted. Old rib fractures are seen. Left vascular stenting is noted. IMPRESSION: New left effusion and consolidation consistent with the known occlusive debris within the left bronchial tree. No pneumothorax following central line placement. Electronically Signed   By: Inez Catalina M.D.   On: 05/26/2022 00:33   CT Angio Chest Pulmonary Embolism (PE) W or WO Contrast  Result Date: 05/25/2022 CLINICAL DATA:  Shortness of breath concern for pulmonary embolus EXAM: CT ANGIOGRAPHY CHEST WITH CONTRAST TECHNIQUE: Multidetector CT imaging of the chest was performed using the standard protocol during bolus administration of intravenous contrast. Multiplanar CT image reconstructions and MIPs were obtained to evaluate the vascular anatomy. RADIATION DOSE REDUCTION: This exam was performed according to the departmental dose-optimization program which includes automated exposure control, adjustment of the mA and/or kV according to patient size and/or use of iterative reconstruction technique. CONTRAST:  28mL OMNIPAQUE IOHEXOL 350 MG/ML SOLN COMPARISON:  May 20, 2022 chest CT. FINDINGS:  Cardiovascular: Satisfactory opacification of the pulmonary arteries to the distal lobar level. Hazy striated appearance of the contrast within the pulmonary arteries with a linear band hypodensity in the left main pulmonary artery on image 180/7 and a more nodular peripheral area hypodensity in the right mid/lower lobar pulmonary artery on image 217/7. No central hypodense filling defect visualized in the pulmonary arteries. Dilation of the main pulmonary artery. Four-chamber cardiac enlargement. Trace pericardial effusion/thickening is similar prior. Mediastinum/Nodes: No suspicious thyroid nodule. Prominent mediastinal and hilar lymph nodes are similar prior for instance an AP window lymph node measuring 8 mm in short axis on image 150/7. Esophagus is grossly unremarkable. Lungs/Pleura: New occlusive debris in the left bronchus with distal consolidation in the dependent left lung is most consistent with aspiration. Complex moderate left pleural effusion which demonstrates peripheral enhancement (split pleura sign) with adjacent atelectasis/consolidation is similar prior. Known masslike area in the right upper lobe on image 43/6 with adjacent atelectasis appears similar prior. Unchanged large right pleural effusion. Previously identified round masslike area possibly reflecting rounded atelectasis is largely obscured by the increased right lung consolidation/atelectasis. Upper Abdomen: No acute abnormality. Musculoskeletal: Multiple healed left rib fractures are again noted. No acute osseous abnormality. Diffuse demineralization of bone. Review of the MIP images confirms the above findings. IMPRESSION: 1. No acute pulmonary embolus confidently identified. Hazy striated appearance of the contrast within the pulmonary arteries with a linear band like hypodensity in the left main pulmonary artery and more nodular peripheral area of hypodensity in the right mid/lower lobar pulmonary artery are favored to reflect  artifact with possible superimposed chronic pulmonary embolus. 2. New occlusive debris in the left bronchus with distal atelectasis/consolidation most consistent with aspiration. 3. Moderate complex left pleural effusion with adjacent atelectasis is unchanged and may reflect an empyema or chronic pleural effusion. 4. Large right pleural effusion with adjacent atelectasis is unchanged. 5. Masslike area in the right upper lobe is largely obscured by adjacent atelectasis but appears similar to prior measuring approximally 2.6 cm. 6. Dilation of the main pulmonary artery suggestive of pulmonary artery hypertension. 7.  Aortic Atherosclerosis (ICD10-I70.0). Electronically Signed   By: Andree Moro.D.  On: 05/25/2022 15:08   DG CHEST PORT 1 VIEW  Result Date: 05/23/2022 CLINICAL DATA:  Thoracentesis EXAM: PORTABLE CHEST 1 VIEW COMPARISON:  05/20/2022 FINDINGS: Patient is slightly rotated. Mildly enlarged cardiac silhouette. Aortic atherosclerosis. Small left pleural effusion with left basilar opacity. No appreciable residual right-sided pleural effusion. Nodular density within the right upper lobe and at the periphery of the left mid lung, better characterized on recent CT. Streaky interstitial opacities in the left upper lobe. No pneumothorax. IMPRESSION: 1. No appreciable residual right-sided pleural effusion following thoracentesis. No pneumothorax. 2. Small left pleural effusion with left basilar opacity, similar to prior. Electronically Signed   By: Davina Poke D.O.   On: 05/23/2022 12:12   ECHOCARDIOGRAM COMPLETE  Result Date: 05/22/2022    ECHOCARDIOGRAM REPORT   Patient Name:   Laquan Beier. Date of Exam: 05/22/2022 Medical Rec #:  [id number]          Height:       69.0 in Accession #:    [id number]         Weight:       160.9 lb Date of Birth:  [birth year]          BSA:          1.884 m Patient Age:    52 years           BP:           83/39 mmHg Patient Gender: M                  HR:            89 bpm. Exam Location:  Inpatient Procedure: 2D Echo, Cardiac Doppler and Color Doppler Indications:    CHF  History:        Patient has prior history of Echocardiogram examinations, most                 recent 04/26/2019. Arrythmias:Atrial Fibrillation,                 Signs/Symptoms:Shortness of Breath and Hypotension; Risk                 Factors:Hypertension, Diabetes and Dyslipidemia.  Sonographer:    Merrie Roof RDCS Referring Phys: [id number] [place]  1. Left ventricular ejection fraction, by estimation, is 60 to 65%. The left ventricle has normal function. Left ventricular endocardial border not optimally defined to evaluate regional wall motion. There is moderate concentric left ventricular hypertrophy. Left ventricular diastolic parameters are indeterminate.  2. Right ventricular systolic function is normal. The right ventricular size is mildly enlarged. There is moderately elevated pulmonary artery systolic pressure. The estimated right ventricular systolic pressure is 03.5 mmHg.  3. Left atrial size was moderately dilated.  4. Right atrial size was severely dilated.  5. The mitral valve is normal in structure. Moderate, eccentric mitral valve regurgitation. There are multiple jets, severity may be underestimated. Right pulmonary vein systolic blunting. unable to use 2D PISA assessment due to jet eccentricity. No evidence of mitral stenosis.  6. Tricuspid valve regurgitation is moderate to severe.  7. The aortic valve is calcified. Aortic valve regurgitation is not visualized. Aortic valve sclerosis/calcification is present, without any evidence of aortic stenosis. Comparison(s): Apical hypokinesis appears to be improve from prior; mitral regurgitation appears worse. Conclusion(s)/Recommendation(s): Due to jet eccentricity, CMR may be considered for regurgitant volume assessment. FINDINGS  Left Ventricle: Left ventricular ejection fraction, by estimation, is 60 to 65%.  The left ventricle  has normal function. Left ventricular endocardial border not optimally defined to evaluate regional wall motion. The left ventricular internal cavity size was normal in size. There is moderate concentric left ventricular hypertrophy. Left ventricular diastolic parameters are indeterminate. Right Ventricle: The right ventricular size is mildly enlarged. No increase in right ventricular wall thickness. Right ventricular systolic function is normal. There is moderately elevated pulmonary artery systolic pressure. The tricuspid regurgitant velocity is 3.30 m/s, and with an assumed right atrial pressure of 15 mmHg, the estimated right ventricular systolic pressure is 72.0 mmHg. Left Atrium: Left atrial size was moderately dilated. Right Atrium: Right atrial size was severely dilated. Pericardium: There is no evidence of pericardial effusion. Mitral Valve: The mitral valve is normal in structure. Mild mitral annular calcification. Moderate mitral valve regurgitation. No evidence of mitral valve stenosis. Tricuspid Valve: The tricuspid valve is normal in structure. Tricuspid valve regurgitation is moderate to severe. Aortic Valve: The aortic valve is calcified. Aortic valve regurgitation is not visualized. Aortic valve sclerosis/calcification is present, without any evidence of aortic stenosis. Pulmonic Valve: The pulmonic valve was normal in structure. Pulmonic valve regurgitation is not visualized. No evidence of pulmonic stenosis. Aorta: The aortic root and ascending aorta are structurally normal, with no evidence of dilitation. IAS/Shunts: No atrial level shunt detected by color flow Doppler.  LEFT VENTRICLE PLAX 2D LVIDd:         4.00 cm LVIDs:         2.70 cm LV PW:         1.40 cm LV IVS:        1.40 cm LVOT diam:     1.90 cm LV SV:         46 LV SV Index:   24 LVOT Area:     2.84 cm  RIGHT VENTRICLE             IVC RV Basal diam:  4.40 cm     IVC diam: 2.80 cm RV Mid diam:    4.10 cm RV S prime:     10.60 cm/s  TAPSE (M-mode): 0.8 cm LEFT ATRIUM           Index        RIGHT ATRIUM           Index LA diam:      5.20 cm 2.76 cm/m   RA Area:     33.90 cm LA Vol (A2C): 91.1 ml 48.36 ml/m  RA Volume:   132.00 ml 70.07 ml/m LA Vol (A4C): 79.7 ml 42.31 ml/m  AORTIC VALVE LVOT Vmax:   90.30 cm/s LVOT Vmean:  56.800 cm/s LVOT VTI:    0.161 m  AORTA Ao Asc diam: 3.40 cm MR PISA:        6.93 cm  TRICUSPID VALVE MR PISA Radius: 1.05 cm   TR Peak grad:   43.6 mmHg                           TR Vmax:        330.00 cm/s                            SHUNTS                           Systemic VTI:  0.16 m  Systemic Diam: 1.90 cm Rudean Haskell MD Electronically signed by Rudean Haskell MD Signature Date/Time: 05/22/2022/12:53:24 PM    Final    CT CHEST WO CONTRAST  Result Date: 05/20/2022 CLINICAL DATA:  Lung nodule, > 70mm Left lung nodule EXAM: CT CHEST WITHOUT CONTRAST TECHNIQUE: Multidetector CT imaging of the chest was performed following the standard protocol without IV contrast. RADIATION DOSE REDUCTION: This exam was performed according to the departmental dose-optimization program which includes automated exposure control, adjustment of the mA and/or kV according to patient size and/or use of iterative reconstruction technique. COMPARISON:  None Available. FINDINGS: Cardiovascular: Extensive multi-vessel coronary artery calcification. Global cardiac size is within normal limits. No pericardial effusion. Central pulmonary arteries are enlarged in keeping with changes of pulmonary arterial hypertension. Extensive atherosclerotic calcification within the thoracic aorta. Mild dilation of the ascending thoracic aorta which measures 4.0 cm in greatest dimension. Descending thoracic aorta is of normal caliber. Vascular stent is partially visualized within the left cephalic arch. Mediastinum/Nodes: Visualized thyroid is unremarkable. No pathologic thoracic adenopathy. Esophagus is unremarkable.  Lungs/Pleura: Large right pleural effusion is present with compressive atelectasis of the dependent right lung. There is a superimposed 2.6 x 1.9 cm mass identified within the right upper lobe which is difficult to accurately measure on this noncontrast examination with adjacent atelectatic lung. On the left, a complex moderate left pleural effusion is again identified within the posterior basal left hemithorax with associated visceral and parietal pleural thickening and complex internal architecture which may relate to hemorrhagic or proteinaceous fluid. There is associated subtotal collapse of the left lower lobe. Within the aerated lingula, a 1.7 x 3.0 cm rounded mass is again identified. This may represent rounded atelectasis, however, underlying neoplastic mass is not excluded. No pneumothorax. No central obstructing lesion. Upper Abdomen: No acute abnormality within the visualized upper abdomen. This is better assessed on CTA examination performed earlier. Musculoskeletal: No acute bone abnormality. No lytic or blastic bone lesion. Multiple healed left rib fractures are noted. The osseous structures are diffusely osteopenic. IMPRESSION: 1. Large right pleural effusion with associated compressive atelectasis of the dependent right lung. 2. Moderate complex left pleural effusion with associated pleural thickening and complex internal architecture which may relate to hemorrhagic or proteinaceous fluid. Super infection, as can be seen with empyema, is not excluded and correlation with clinical laboratory examination is recommended. Diagnostic thoracentesis may be helpful for further evaluation. Associated subtotal collapse of the left lower lobe. 3. Superimposed 2.6 cm mass within the right upper lobe and 3.0 cm mass within the aerated lingula. In absence of prior examinations to determine acuity, PET CT examination is recommended for further evaluation, if clinically indicated. 4. Extensive multi-vessel coronary  artery calcification. 5. Mild dilation of the ascending thoracic aorta which measures 4.0 cm in greatest dimension. Recommend annual imaging followup by CTA or MRA. This recommendation follows 2010 ACCF/AHA/AATS/ACR/ASA/SCA/SCAI/SIR/STS/SVM Guidelines for the Diagnosis and Management of Patients with Thoracic Aortic Disease. Circulation. 2010; 121: N562-Z308. Aortic aneurysm NOS (ICD10-I71.9) 6. Enlarged central pulmonary arteries in keeping with changes of pulmonary arterial hypertension. Aortic Atherosclerosis (ICD10-I70.0). Electronically Signed   By: Fidela Salisbury M.D.   On: 05/20/2022 22:58   CT Angio Abd/Pel W and/or Wo Contrast  Result Date: 05/20/2022 CLINICAL DATA:  87 year old male with a history of lower GI bleeding EXAM: CTA ABDOMEN AND PELVIS WITHOUT AND WITH CONTRAST TECHNIQUE: Multidetector CT imaging of the abdomen and pelvis was performed using the standard protocol during bolus administration of intravenous contrast. Multiplanar reconstructed images  and MIPs were obtained and reviewed to evaluate the vascular anatomy. RADIATION DOSE REDUCTION: This exam was performed according to the departmental dose-optimization program which includes automated exposure control, adjustment of the mA and/or kV according to patient size and/or use of iterative reconstruction technique. CONTRAST:  175mL OMNIPAQUE IOHEXOL 350 MG/ML SOLN COMPARISON:  CT 04/24/2019 FINDINGS: VASCULAR Aorta: Mild atherosclerotic changes of the lower thoracic aorta and the abdominal aorta. No pedunculated plaque, ulcerated plaque, dissection. No aneurysm. No periaortic fluid or inflammatory changes. Celiac: Celiac artery patent.  Branches patent. SMA: SMA patent with mild atherosclerosis at the origin and estimated less than 50% narrowing. Branches are patent. Renals: - Right: Single right renal artery. Atherosclerotic changes at the origin. Estimated 50% narrowing secondary to calcified plaque. - Left: Single left renal artery.  Atherosclerotic changes at the origin with at least 50% narrowing secondary to calcified plaque. IMA: Inferior mesenteric artery is patent. Right lower extremity: Mild tortuosity of the right iliac system. Mild atherosclerotic changes without evidence of high-grade stenosis of the common iliac artery or external iliac artery. Hypogastric artery is patent. Pelvic arteries patent. Common femoral artery patent with mild atherosclerosis. Mixed calcified and soft plaque in the proximal right superficial femoral artery, which appears occluded after the origin. Profunda femoris patent. Left lower extremity: Tortuosity of the left iliac system. Mild atherosclerosis without high-grade stenosis or occlusion of the common iliac artery and external iliac artery. Hypogastric artery is patent as well as pelvic arteries. Common femoral artery patent with mild atherosclerosis. Proximal profunda femoris and SFA patent. Veins: Unremarkable appearance of the venous system. Review of the MIP images confirms the above findings. NON-VASCULAR Lower chest: Bilateral pleural effusions at the lung bases. Thickening of the left-sided visceral and parietal pleura, new from the prior of 04/24/2019. Nodule at the left lung base measures 2.3 cm, new from the comparison. Atelectasis bilateral lung bases. Hepatobiliary: Unremarkable appearance of the liver. Calcified stones within the gallbladder lumen. Pancreas: Rounded hypodense lesion within the pancreas body, levin mm, new from the comparison. No inflammatory changes of pancreas. Spleen: Unremarkable. Adrenals/Urinary Tract: - Right adrenal gland: Unremarkable - Left adrenal gland: Unremarkable. - Right kidney: No hydronephrosis. Small nonobstructing stones in the calyceal tips. Unremarkable right ureter. - Left Kidney: No hydronephrosis. Nonobstructing stones at the calyceal tips. Unremarkable course of the left ureter. - Urinary Bladder: Bladder is contracted. Stomach/Bowel: - Stomach:  Unremarkable. - Small bowel: Small bowel decompressed with no transition point. No inflammatory changes. No puddling of contrast. - Appendix: Normal. - Colon: Colon decompressed with no transition point. No puddling of contrast. No wall thickening. No inflammatory changes. Left-sided diverticular disease. No transition point. Lymphatic: No adenopathy. Mesenteric: Mild edema within the mesentery. No free fluid or significant ascites. Reproductive: Unremarkable Other: Body wall edema/anasarca Musculoskeletal: Osteopenia. New compression fractures of L2 and L4. No significant height loss at L2. Approximately 50% height loss at L4. No significant retropulsion of the posterior endplates. Progression of the L1 compression fracture with approximately 50% height loss, progressed from the comparison. No bony canal narrowing. Degenerative changes of the hips. IMPRESSION: CT angiogram negative for evidence of acute gastrointestinal hemorrhage. Bilateral pleural effusions. The left side demonstrates pleural thickening of the visceral and parietal pleura, and empyema cannot be excluded. Given this finding as well as the presence of an indeterminate 2.3 cm lung nodule on the left, complete chest CT imaging is recommended. Mild mesenteric and body wall edema/anasarca, suggesting fluid positive state. No overt pulmonary edema or ascites. Multilevel peripheral  arterial disease, including mild aortic atherosclerosis, bilateral iliac atherosclerosis, and proximal femoral arterial disease in this patient with history of hemodialysis. Likely proximal right SFA occlusion. Aortic Atherosclerosis (ICD10-I70.0). 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 New compression fractures of L2 and L4, as above. There has been progression of prior L1 compression fracture. If symptomatic, further evaluation with MR lumbar spine may be considered to determine acuity/chronicity. Additional  ancillary findings as above. Signed, Dulcy Fanny. Nadene Rubins, RPVI [facility] Radiology Electronically Signed   By: Corrie Mckusick D.O.   On: 05/20/2022 16:32     Subjective: Patient was seen and examined at bedside.  Overnight events noted.  Patient reports feeling much improved.  Patient is being discharged to skilled nursing facility for rehab today and he will continue hemodialysis as per schedule.  Discharge Exam: Vitals:   06/01/22 0751 06/01/22 1131  BP: 123/81 128/68  Pulse: 98   Resp: 19   Temp: 98.4 F (36.9 C)   SpO2: 100%    Vitals:   06/01/22 0354 06/01/22 0500 06/01/22 0751 06/01/22 1131  BP: 129/77  123/81 128/68  Pulse: (!) 104  98   Resp: 20  19   Temp: 97.8 F (36.6 C)  98.4 F (36.9 C)   TempSrc: Oral  Oral   SpO2: 100%  100%   Weight:  74.4 kg    Height:        General: Pt is alert, awake, not in acute distress Cardiovascular: RRR, S1/S2 +, no rubs, no gallops Respiratory: CTA bilaterally, no wheezing, no rhonchi Abdominal: Soft, NT, ND, bowel sounds + Extremities: Bilateral BKA    The results of significant diagnostics from this hospitalization (including imaging, microbiology, ancillary and laboratory) are listed below for reference.     Microbiology: Recent Results (from the past 240 hour(s))  Body fluid culture w Gram Stain     Status: None   Collection Time: 05/23/22 11:04 AM   Specimen: Pleural Fluid  Result Value Ref Range Status   Specimen Description PLEURAL  Final   Special Requests FLUID  Final   Gram Stain   Final    FEW WBC PRESENT, PREDOMINANTLY MONONUCLEAR NO ORGANISMS SEEN    Culture   Final    NO GROWTH 3 DAYS Performed at [facility], [address] [place], [place] [address]    Report Status 05/27/2022 FINAL  Final  MRSA Next Gen by PCR, Nasal     Status: None   Collection Time: 05/26/22  8:07 AM   Specimen: Nasal Mucosa; Nasal Swab  Result Value Ref Range  Status   MRSA by PCR Next Gen NOT DETECTED NOT DETECTED Final    Comment: (NOTE) The GeneXpert MRSA Assay (FDA approved for NASAL specimens only), is one component of a comprehensive MRSA colonization surveillance program. It is not intended to diagnose MRSA infection nor to guide or monitor treatment for MRSA infections. Test performance is not FDA approved in patients less than 66 years old. Performed at [facility], [place] [address], [place], [place] [address]      Labs: BNP (last 3 results) No results for input(s): "BNP" in the last 8760 hours. Basic Metabolic Panel: Recent Labs  Lab 05/26/22 0120 05/27/22 0006 05/28/22 0148 05/30/22 0229 05/31/22 2205  NA 131* 134* 130* 134* 130*  K 3.9 3.7 4.2 3.9 3.7  CL 95* 97* 95* 95* 94*  CO2 24 29  26 28 27   GLUCOSE 216* 113* 88 138* 109*  BUN 18 10 14  6* 15  CREATININE 3.47* 2.48* 3.27* 2.16* 3.39*  CALCIUM 7.9* 7.8* 8.3* 8.5* 8.3*  MG  --  1.7  --   --  1.8  PHOS  --   --   --   --  2.3*   Liver Function Tests: No results for input(s): "AST", "ALT", "ALKPHOS", "BILITOT", "PROT", "ALBUMIN" in the last 168 hours. No results for input(s): "LIPASE", "AMYLASE" in the last 168 hours. No results for input(s): "AMMONIA" in the last 168 hours. CBC: Recent Labs  Lab 05/28/22 0148 05/29/22 0119 05/30/22 0229 05/31/22 0425 05/31/22 2205  WBC 9.3 8.4 9.7 9.0 9.6  HGB 9.6* 10.0* 9.4* 9.5* 9.7*  HCT 29.6* 30.2* 30.3* 30.5* 30.0*  MCV 96.7 97.1 100.0 100.0 97.7  PLT 159 155 151 154 148*   Cardiac Enzymes: No results for input(s): "CKTOTAL", "CKMB", "CKMBINDEX", "TROPONINI" in the last 168 hours. BNP: Invalid input(s): "POCBNP" CBG: Recent Labs  Lab 05/31/22 1951 06/01/22 0120 06/01/22 0353 06/01/22 0732 06/01/22 1156  GLUCAP 124* 88 99 90 131*   D-Dimer No results for input(s): "DDIMER" in the last 72 hours. Hgb A1c No results for input(s): "HGBA1C" in the last 72 hours. Lipid Profile No results for input(s):  "CHOL", "HDL", "LDLCALC", "TRIG", "CHOLHDL", "LDLDIRECT" in the last 72 hours. Thyroid function studies No results for input(s): "TSH", "T4TOTAL", "T3FREE", "THYROIDAB" in the last 72 hours.  Invalid input(s): "FREET3" Anemia work up No results for input(s): "VITAMINB12", "FOLATE", "FERRITIN", "TIBC", "IRON", "RETICCTPCT" in the last 72 hours. Urinalysis No results found for: "COLORURINE", "APPEARANCEUR", "LABSPEC", "PHURINE", "GLUCOSEU", "HGBUR", "BILIRUBINUR", "KETONESUR", "PROTEINUR", "UROBILINOGEN", "NITRITE", "LEUKOCYTESUR" Sepsis Labs Recent Labs  Lab 05/29/22 0119 05/30/22 0229 05/31/22 0425 05/31/22 2205  WBC 8.4 9.7 9.0 9.6   Microbiology Recent Results (from the past 240 hour(s))  Body fluid culture w Gram Stain     Status: None   Collection Time: 05/23/22 11:04 AM   Specimen: Pleural Fluid  Result Value Ref Range Status   Specimen Description PLEURAL  Final   Special Requests FLUID  Final   Gram Stain   Final    FEW WBC PRESENT, PREDOMINANTLY MONONUCLEAR NO ORGANISMS SEEN    Culture   Final    NO GROWTH 3 DAYS Performed at [facility], [place] [address], [place], [place] [address]    Report Status 05/27/2022 FINAL  Final  MRSA Next Gen by PCR, Nasal     Status: None   Collection Time: 05/26/22  8:07 AM   Specimen: Nasal Mucosa; Nasal Swab  Result Value Ref Range Status   MRSA by PCR Next Gen NOT DETECTED NOT DETECTED Final    Comment: (NOTE) The GeneXpert MRSA Assay (FDA approved for NASAL specimens only), is one component of a comprehensive MRSA colonization surveillance program. It is not intended to diagnose MRSA infection nor to guide or monitor treatment for MRSA infections. Test performance is not FDA approved in patients less than 2 years old. Performed at [facility], [place] [address] [place], [place] [address]      Time coordinating discharge: Over 30 minutes  SIGNED:   Shawna Clamp, MD  [facility] 06/01/2022, 1:08  PM Pager   If 7PM-7AM, please contact night-coverage

## 2022-06-01 NOTE — Progress Notes (Signed)
Report called to [facility].  Report given to Jan, Diplomatic Services operational officer.

## 2022-06-01 NOTE — Progress Notes (Signed)
Contacted [facility] and message left confirming pt's d/c for today. Clinic advised pt will resume care tomorrow. D/C summary and last renal note faxed to clinic for continuation of care.   Melven Sartorius Renal Navigator [phone]

## 2022-06-01 NOTE — Progress Notes (Signed)
[facility] Progress Note   Subjective:   Had HD yesterday with 2L UF. Says he is feeling better today. Mildly SOB while eating but no SOB at rest. Denies CP, dizziness and nausea.   Objective Vitals:   06/01/22 0100 06/01/22 0354 06/01/22 0500 06/01/22 0751  BP: (!) 150/79 129/77  123/81  Pulse: 92 (!) 104  98  Resp: 16 20  19   Temp: 97.9 F (36.6 C) 97.8 F (36.6 C)  98.4 F (36.9 C)  TempSrc: Oral Oral  Oral  SpO2: 100% 100%  100%  Weight:   74.4 kg   Height:       Physical Exam General: Elderly male, alert and in NAD Heart: Irregular rhythm, no murmurs, rubs or gallops Lungs: CTA bilaterally without wheezing, rhonchi or rales Abdomen: Soft, non-distended, +BS Extremities: No edema b/l lower extremities Dialysis Access: LUE AVF + bruit  Additional Objective Labs: Basic Metabolic Panel: Recent Labs  Lab 05/28/22 0148 05/30/22 0229 05/31/22 2205  NA 130* 134* 130*  K 4.2 3.9 3.7  CL 95* 95* 94*  CO2 26 28 27   GLUCOSE 88 138* 109*  BUN 14 6* 15  CREATININE 3.27* 2.16* 3.39*  CALCIUM 8.3* 8.5* 8.3*  PHOS  --   --  2.3*   Liver Function Tests: No results for input(s): "AST", "ALT", "ALKPHOS", "BILITOT", "PROT", "ALBUMIN" in the last 168 hours. No results for input(s): "LIPASE", "AMYLASE" in the last 168 hours. CBC: Recent Labs  Lab 05/28/22 0148 05/29/22 0119 05/30/22 0229 05/31/22 0425 05/31/22 2205  WBC 9.3 8.4 9.7 9.0 9.6  HGB 9.6* 10.0* 9.4* 9.5* 9.7*  HCT 29.6* 30.2* 30.3* 30.5* 30.0*  MCV 96.7 97.1 100.0 100.0 97.7  PLT 159 155 151 154 148*   Blood Culture    Component Value Date/Time   SDES PLEURAL 05/23/2022 1104   SPECREQUEST FLUID 05/23/2022 1104   CULT  05/23/2022 1104    NO GROWTH 3 DAYS Performed at [facility], [place] [address] [place], [place] [address]    REPTSTATUS 05/27/2022 FINAL 05/23/2022 1104    Cardiac Enzymes: No results for input(s): "CKTOTAL", "CKMB", "CKMBINDEX", "TROPONINI" in the last 168  hours. CBG: Recent Labs  Lab 05/31/22 1546 05/31/22 1951 06/01/22 0120 06/01/22 0353 06/01/22 0732  GLUCAP 149* 124* 88 99 90   Iron Studies: No results for input(s): "IRON", "TIBC", "TRANSFERRIN", "FERRITIN" in the last 72 hours. @lablastinr3 @ Studies/Results: No results found. Medications:   amoxicillin-clavulanate  500 mg Oral Q12H   apixaban  2.5 mg Oral BID   atorvastatin  40 mg Oral QHS   Chlorhexidine Gluconate Cloth  6 each Topical Q0600   Chlorhexidine Gluconate Cloth  6 each Topical Q0600   cyanocobalamin  1,000 mcg Intramuscular Q7 days   insulin aspart  1-3 Units Subcutaneous Q4H   levothyroxine  25 mcg Oral QAC breakfast   midodrine  20 mg Oral Q8H   pantoprazole  40 mg Oral Daily   rOPINIRole  0.5 mg Oral QHS   sodium chloride flush  10-40 mL Intracatheter Q12H   sodium chloride flush  3 mL Intravenous Q12H    Dialysis Orders: MWF [facility] (> per Care Everywhere)  3h 57min  400/2.0  73kg  2/2.5 bath  LUA AVG   16ga  Hep ? 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 S/p  PRBCs. Hgb stable in 9s. 2. Shock - due to hemorrhage as above, not on pressors at this time 3. Aspiration Pna: s/p vanc/cefepime, transitioned to augmentin.  needs 4-6 weeks ABX.  Per PCCM Pleural effusions/ lung mass - by CT. Is being f/b doctor in [place] for lung mass. Per CCM. S/p thora 8/13-850cc drained. Location not amendable to chest tube placement by pulm or IR. Palliative consulted. Per PMD. ESRD - on HD MWF in [place]. Extra HD off schedule Saturday for volume removal without success d/t hypotension. Did tolerate 2L UF with HD Monday.  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 Fe replete on panel. Stopped ESA given lung mass and concerning PET scan 6. MBD ckd - CCa in range, phos on low side.  No binders needed. Cont to monitor, replete if needed 7.  Atrial fib -restarted on hep gtt 8. HTN/volume - on midodrine. SOB improved with UF yesterday. Continue midodrine for BP support.   Anice Paganini, PA-C 06/01/2022, 9:23 AM  [facility] Pager: [phone]

## 2022-06-01 NOTE — Progress Notes (Signed)
Pt transferred via [facility], discharge paperwork given, all belongings taken transferred with pt.

## 2022-06-01 NOTE — TOC Transition Note (Signed)
Transition of Care [facility]) - CM/SW Discharge Note   Patient Details  Name: Terry Graves. MRN: [id number] Date of Birth: 1935-07-17  Transition of Care [facility]) CM/SW Contact:  Joanne Chars, LCSW Phone Number: 06/01/2022, 1:24 PM   Clinical Narrative:   Pt discharging to [facility], [place], [place].  RN call [phone] for report.     Final next level of care: [place] Barriers to Discharge: Barriers Resolved   Patient Goals and CMS Choice Patient states their goals for this hospitalization and ongoing recovery are:: "to get stronger"      Discharge Placement              Patient chooses bed at:  ([facility]) Patient to be transferred to facility by: [facility] Name of family member notified: daughter Vaughan Basta Patient and family notified of of transfer: 06/01/22  Discharge Plan and Services                                     Social Determinants of Health (SDOH) Interventions     Readmission Risk Interventions     No data to display

## 2022-06-01 NOTE — Progress Notes (Signed)
Received patient in bed to unit.  Alert and oriented.  Informed consent signed and in  chart.   Treatment initiated: 2031 Treatment completed: 0019  Patient tolerated well.  Transported back to the room  alert, without acute distress.  Hand-off given to patient's nurse.   Access used: [place] Access issues: none  Total UF removed: 2000 Medication(s) given: Midodrine 10mg  Post HD VS: 97.5, 144/79(98), HR-90, RR-18, SP02-100 Post HD weight: 72.8kg   [facility]

## 2022-06-01 NOTE — Progress Notes (Signed)
IV team dc'd left IJ central line, procedure tolerated well by patient.  Will eep HOB flat until 1210H.

## 2022-06-01 NOTE — Progress Notes (Signed)
PT Cancellation Note  Patient Details Name: Terry Graves. MRN: [id number] DOB: 06/18/1935   Cancelled Treatment:    Reason Eval/Treat Not Completed: Other (comment) Pt declining due to dyspnea at rest.  Wyona Almas, PT, DPT Acute Rehabilitation Services Office [place] 06/01/2022, 3:15 PM

## 2022-06-01 NOTE — Progress Notes (Signed)
Report received from [place]ett [place], RN, Hemodialyses.

## 2022-06-01 NOTE — Care Management Important Message (Signed)
Important Message  Patient Details  Name: Terry Graves. MRN: [id number] Date of Birth: 03/10/1935   Medicare Important Message Given:  Yes     Hannah Beat 06/01/2022, 11:39 AM

## 2022-06-01 NOTE — Progress Notes (Signed)
Patient told RT he is uninterested in CPAP at this time but will call if needed.

## 2022-06-01 NOTE — Progress Notes (Signed)
Pt returned to floor from HD.

## 2022-08-11 DEATH — deceased
